# Patient Record
Sex: Male | Born: 1945 | Race: White | Hispanic: No | Smoking: Former smoker
Health system: Southern US, Community
[De-identification: ages and names within clinical notes are randomized; demographics above are authoritative.]

## PROBLEM LIST (undated history)

## (undated) DIAGNOSIS — E039 Hypothyroidism, unspecified: Secondary | ICD-10-CM

## (undated) DIAGNOSIS — R011 Cardiac murmur, unspecified: Secondary | ICD-10-CM

## (undated) DIAGNOSIS — J449 Chronic obstructive pulmonary disease, unspecified: Secondary | ICD-10-CM

## (undated) DIAGNOSIS — C3490 Malignant neoplasm of unspecified part of unspecified bronchus or lung: Secondary | ICD-10-CM

## (undated) DIAGNOSIS — E785 Hyperlipidemia, unspecified: Secondary | ICD-10-CM

## (undated) DIAGNOSIS — I2510 Atherosclerotic heart disease of native coronary artery without angina pectoris: Secondary | ICD-10-CM

## (undated) DIAGNOSIS — K219 Gastro-esophageal reflux disease without esophagitis: Secondary | ICD-10-CM

## (undated) DIAGNOSIS — E669 Obesity, unspecified: Secondary | ICD-10-CM

## (undated) DIAGNOSIS — J189 Pneumonia, unspecified organism: Secondary | ICD-10-CM

## (undated) HISTORY — DX: Obesity, unspecified: E66.9

## (undated) HISTORY — DX: Atherosclerotic heart disease of native coronary artery without angina pectoris: I25.10

## (undated) HISTORY — PX: LUNG LOBECTOMY: SHX167

## (undated) HISTORY — DX: Malignant neoplasm of unspecified part of unspecified bronchus or lung: C34.90

## (undated) HISTORY — DX: Hyperlipidemia, unspecified: E78.5

---
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
## 1988-10-08 HISTORY — PX: CORONARY ANGIOPLASTY: SHX604

## 1999-02-15 ENCOUNTER — Encounter: Admission: RE | Admit: 1999-02-15 | Discharge: 1999-02-15 | Payer: Self-pay | Admitting: Rheumatology

## 1999-02-15 ENCOUNTER — Encounter: Payer: Self-pay | Admitting: Rheumatology

## 1999-06-17 ENCOUNTER — Emergency Department (HOSPITAL_COMMUNITY): Admission: EM | Admit: 1999-06-17 | Discharge: 1999-06-17 | Payer: Self-pay | Admitting: *Deleted

## 1999-06-22 ENCOUNTER — Ambulatory Visit (HOSPITAL_COMMUNITY): Admission: RE | Admit: 1999-06-22 | Discharge: 1999-06-22 | Payer: Self-pay | Admitting: Cardiothoracic Surgery

## 1999-06-22 ENCOUNTER — Encounter: Payer: Self-pay | Admitting: Cardiothoracic Surgery

## 1999-06-29 ENCOUNTER — Encounter (INDEPENDENT_AMBULATORY_CARE_PROVIDER_SITE_OTHER): Payer: Self-pay | Admitting: Specialist

## 1999-06-29 ENCOUNTER — Ambulatory Visit (HOSPITAL_COMMUNITY): Admission: RE | Admit: 1999-06-29 | Discharge: 1999-06-29 | Payer: Self-pay | Admitting: Cardiothoracic Surgery

## 1999-06-29 ENCOUNTER — Encounter: Payer: Self-pay | Admitting: Cardiothoracic Surgery

## 1999-07-16 ENCOUNTER — Encounter: Payer: Self-pay | Admitting: Cardiothoracic Surgery

## 1999-07-20 ENCOUNTER — Encounter (INDEPENDENT_AMBULATORY_CARE_PROVIDER_SITE_OTHER): Payer: Self-pay | Admitting: *Deleted

## 1999-07-20 ENCOUNTER — Inpatient Hospital Stay (HOSPITAL_COMMUNITY): Admission: RE | Admit: 1999-07-20 | Discharge: 1999-07-27 | Payer: Self-pay | Admitting: Cardiothoracic Surgery

## 1999-07-20 ENCOUNTER — Encounter: Payer: Self-pay | Admitting: Cardiothoracic Surgery

## 1999-07-20 ENCOUNTER — Encounter (INDEPENDENT_AMBULATORY_CARE_PROVIDER_SITE_OTHER): Payer: Self-pay | Admitting: Specialist

## 1999-07-21 ENCOUNTER — Encounter: Payer: Self-pay | Admitting: Cardiothoracic Surgery

## 1999-07-22 ENCOUNTER — Encounter: Payer: Self-pay | Admitting: Cardiothoracic Surgery

## 1999-07-23 ENCOUNTER — Encounter: Payer: Self-pay | Admitting: Cardiothoracic Surgery

## 1999-07-24 ENCOUNTER — Encounter: Payer: Self-pay | Admitting: Cardiothoracic Surgery

## 1999-07-25 ENCOUNTER — Encounter: Payer: Self-pay | Admitting: Cardiothoracic Surgery

## 1999-08-06 ENCOUNTER — Encounter: Payer: Self-pay | Admitting: Cardiothoracic Surgery

## 1999-08-06 ENCOUNTER — Encounter: Admission: RE | Admit: 1999-08-06 | Discharge: 1999-08-06 | Payer: Self-pay | Admitting: Cardiothoracic Surgery

## 1999-08-20 ENCOUNTER — Encounter: Payer: Self-pay | Admitting: Cardiothoracic Surgery

## 1999-08-20 ENCOUNTER — Encounter: Admission: RE | Admit: 1999-08-20 | Discharge: 1999-08-20 | Payer: Self-pay | Admitting: Cardiothoracic Surgery

## 1999-10-01 ENCOUNTER — Encounter: Admission: RE | Admit: 1999-10-01 | Discharge: 1999-10-01 | Payer: Self-pay | Admitting: Cardiothoracic Surgery

## 1999-10-01 ENCOUNTER — Encounter: Payer: Self-pay | Admitting: Cardiothoracic Surgery

## 1999-11-26 ENCOUNTER — Encounter: Payer: Self-pay | Admitting: Cardiothoracic Surgery

## 1999-11-26 ENCOUNTER — Encounter: Admission: RE | Admit: 1999-11-26 | Discharge: 1999-11-26 | Payer: Self-pay | Admitting: Cardiothoracic Surgery

## 2000-01-21 ENCOUNTER — Encounter: Payer: Self-pay | Admitting: Cardiothoracic Surgery

## 2000-01-21 ENCOUNTER — Encounter: Admission: RE | Admit: 2000-01-21 | Discharge: 2000-01-21 | Payer: Self-pay | Admitting: Cardiothoracic Surgery

## 2000-02-02 ENCOUNTER — Encounter: Payer: Self-pay | Admitting: Cardiothoracic Surgery

## 2000-02-02 ENCOUNTER — Ambulatory Visit (HOSPITAL_COMMUNITY): Admission: RE | Admit: 2000-02-02 | Discharge: 2000-02-02 | Payer: Self-pay | Admitting: Cardiothoracic Surgery

## 2000-02-02 ENCOUNTER — Encounter (INDEPENDENT_AMBULATORY_CARE_PROVIDER_SITE_OTHER): Payer: Self-pay | Admitting: Specialist

## 2000-03-29 ENCOUNTER — Encounter: Admission: RE | Admit: 2000-03-29 | Discharge: 2000-06-27 | Payer: Self-pay | Admitting: Anesthesiology

## 2000-04-28 ENCOUNTER — Encounter: Admission: RE | Admit: 2000-04-28 | Discharge: 2000-04-28 | Payer: Self-pay | Admitting: Cardiothoracic Surgery

## 2000-04-28 ENCOUNTER — Encounter: Payer: Self-pay | Admitting: Cardiothoracic Surgery

## 2000-07-08 DIAGNOSIS — C3490 Malignant neoplasm of unspecified part of unspecified bronchus or lung: Secondary | ICD-10-CM

## 2000-07-08 HISTORY — DX: Malignant neoplasm of unspecified part of unspecified bronchus or lung: C34.90

## 2000-07-24 ENCOUNTER — Encounter: Admission: RE | Admit: 2000-07-24 | Discharge: 2000-10-07 | Payer: Self-pay | Admitting: Anesthesiology

## 2000-09-29 ENCOUNTER — Encounter: Admission: RE | Admit: 2000-09-29 | Discharge: 2000-09-29 | Payer: Self-pay | Admitting: Cardiothoracic Surgery

## 2000-09-29 ENCOUNTER — Encounter: Payer: Self-pay | Admitting: Cardiothoracic Surgery

## 2001-04-20 ENCOUNTER — Encounter: Admission: RE | Admit: 2001-04-20 | Discharge: 2001-04-20 | Payer: Self-pay | Admitting: Cardiothoracic Surgery

## 2001-04-20 ENCOUNTER — Encounter: Payer: Self-pay | Admitting: Cardiothoracic Surgery

## 2001-09-21 ENCOUNTER — Encounter: Payer: Self-pay | Admitting: Cardiothoracic Surgery

## 2001-09-21 ENCOUNTER — Encounter: Admission: RE | Admit: 2001-09-21 | Discharge: 2001-09-21 | Payer: Self-pay | Admitting: Cardiothoracic Surgery

## 2002-05-24 ENCOUNTER — Encounter: Payer: Self-pay | Admitting: Cardiothoracic Surgery

## 2002-05-24 ENCOUNTER — Encounter: Admission: RE | Admit: 2002-05-24 | Discharge: 2002-05-24 | Payer: Self-pay | Admitting: Cardiothoracic Surgery

## 2003-06-06 ENCOUNTER — Encounter: Admission: RE | Admit: 2003-06-06 | Discharge: 2003-06-06 | Payer: Self-pay | Admitting: Cardiothoracic Surgery

## 2003-07-19 ENCOUNTER — Emergency Department (HOSPITAL_COMMUNITY): Admission: EM | Admit: 2003-07-19 | Discharge: 2003-07-19 | Payer: Self-pay | Admitting: Emergency Medicine

## 2004-05-25 ENCOUNTER — Encounter: Admission: RE | Admit: 2004-05-25 | Discharge: 2004-05-25 | Payer: Self-pay | Admitting: Cardiothoracic Surgery

## 2007-01-13 ENCOUNTER — Inpatient Hospital Stay (HOSPITAL_COMMUNITY): Admission: EM | Admit: 2007-01-13 | Discharge: 2007-01-18 | Payer: Self-pay | Admitting: Emergency Medicine

## 2007-01-13 ENCOUNTER — Ambulatory Visit: Payer: Self-pay | Admitting: Internal Medicine

## 2007-01-15 ENCOUNTER — Encounter (INDEPENDENT_AMBULATORY_CARE_PROVIDER_SITE_OTHER): Payer: Self-pay | Admitting: Internal Medicine

## 2007-02-06 ENCOUNTER — Ambulatory Visit: Payer: Self-pay | Admitting: Pulmonary Disease

## 2007-02-06 DIAGNOSIS — J449 Chronic obstructive pulmonary disease, unspecified: Secondary | ICD-10-CM | POA: Insufficient documentation

## 2007-02-06 DIAGNOSIS — E782 Mixed hyperlipidemia: Secondary | ICD-10-CM

## 2007-02-14 ENCOUNTER — Encounter: Payer: Self-pay | Admitting: Pulmonary Disease

## 2007-03-17 ENCOUNTER — Emergency Department (HOSPITAL_COMMUNITY): Admission: EM | Admit: 2007-03-17 | Discharge: 2007-03-17 | Payer: Self-pay | Admitting: Emergency Medicine

## 2007-04-03 ENCOUNTER — Ambulatory Visit: Payer: Self-pay | Admitting: Pulmonary Disease

## 2007-04-30 ENCOUNTER — Telehealth (INDEPENDENT_AMBULATORY_CARE_PROVIDER_SITE_OTHER): Payer: Self-pay | Admitting: *Deleted

## 2007-07-03 ENCOUNTER — Ambulatory Visit: Payer: Self-pay | Admitting: Pulmonary Disease

## 2007-09-28 ENCOUNTER — Telehealth: Payer: Self-pay | Admitting: Pulmonary Disease

## 2008-01-04 ENCOUNTER — Ambulatory Visit: Payer: Self-pay | Admitting: Pulmonary Disease

## 2008-01-04 DIAGNOSIS — J209 Acute bronchitis, unspecified: Secondary | ICD-10-CM

## 2008-01-08 ENCOUNTER — Telehealth (INDEPENDENT_AMBULATORY_CARE_PROVIDER_SITE_OTHER): Payer: Self-pay | Admitting: *Deleted

## 2008-01-23 ENCOUNTER — Telehealth (INDEPENDENT_AMBULATORY_CARE_PROVIDER_SITE_OTHER): Payer: Self-pay | Admitting: *Deleted

## 2009-05-06 ENCOUNTER — Ambulatory Visit: Payer: Self-pay | Admitting: Pulmonary Disease

## 2009-09-03 ENCOUNTER — Ambulatory Visit: Payer: Self-pay | Admitting: Pulmonary Disease

## 2009-10-30 ENCOUNTER — Telehealth (INDEPENDENT_AMBULATORY_CARE_PROVIDER_SITE_OTHER): Payer: Self-pay | Admitting: *Deleted

## 2010-01-01 ENCOUNTER — Ambulatory Visit: Payer: Self-pay | Admitting: Pulmonary Disease

## 2010-01-14 ENCOUNTER — Inpatient Hospital Stay (HOSPITAL_COMMUNITY): Admission: EM | Admit: 2010-01-14 | Discharge: 2009-04-24 | Payer: Self-pay | Admitting: Emergency Medicine

## 2010-01-18 ENCOUNTER — Ambulatory Visit: Payer: Self-pay | Admitting: Pulmonary Disease

## 2010-01-18 DIAGNOSIS — J441 Chronic obstructive pulmonary disease with (acute) exacerbation: Secondary | ICD-10-CM | POA: Insufficient documentation

## 2010-02-05 ENCOUNTER — Ambulatory Visit
Admission: RE | Admit: 2010-02-05 | Discharge: 2010-02-05 | Payer: Self-pay | Source: Home / Self Care | Attending: Pulmonary Disease | Admitting: Pulmonary Disease

## 2010-02-19 ENCOUNTER — Ambulatory Visit
Admission: RE | Admit: 2010-02-19 | Discharge: 2010-02-19 | Payer: Self-pay | Source: Home / Self Care | Attending: Pulmonary Disease | Admitting: Pulmonary Disease

## 2010-02-28 ENCOUNTER — Encounter: Payer: Self-pay | Admitting: Cardiothoracic Surgery

## 2010-03-09 NOTE — Progress Notes (Signed)
Summary: advair to [facility]  Phone Note From Pharmacy Call back at [phone]   Caller: [facility]* Call For: clance  Summary of Call: Need prescription for advair diskus 250-37mcg, states they never filled it before for pt.  Initial call taken by: Darletta Moll,  October 30, 2009 11:54 AM  Follow-up for Phone Call        advair 250-19mcg is on med list.  last ov in July, upcoming in November.  refills sent to pt's pharmacy of choice.   Follow-up by: Boone Master CNA/MA,  October 30, 2009 12:28 PM    Prescriptions: ADVAIR DISKUS 250-50 MCG/DOSE  MISC (FLUTICASONE-SALMETEROL) 1 puff two times a day  #1 x 6   Entered by:   Boone Master CNA/MA   Authorized by:   Barbaraann Share MD   Signed by:   Boone Master CNA/MA on 10/30/2009   Method used:   Electronically to        [facility]* (retail)       [address] [place], [place]  [address]       Ph: [phone] or [phone]       Fax: [phone]   RxID:   [id number]

## 2010-03-09 NOTE — Assessment & Plan Note (Signed)
Summary: rov for emphysema   Primary Provider/Referring Provider:  Windle Guard  CC:  HFU for COPD and pneumonia. Patient says he has some sob with exertion and back pain. He denies any cough..  History of Present Illness: The pt comes in today for f/u of his known emphysema, complicated by ongoing smoking.  He was recentlly in the hospital for a COPD exacerbation, and was asked to f/u here in 2 weeks.  He is slowly gettting back to his usual chronic doe baseline.  He has minimal cough with no purulence.  Unfortunately, he continues to smoke.  He is back on his usual bronchodilator regimen.  Current Medications (verified): 1)  Levothyroxine Sodium 137 Mcg Tabs (Levothyroxine Sodium) .Marland Kitchen.. 1 By Mouth Daily 2)  Spiriva Handihaler 18 Mcg  Caps (Tiotropium Bromide Monohydrate) .... Inhale 1 Capsule Once A Day 3)  Aspirin 81 Mg Tbec (Aspirin) .... 2 By Mouth Daily 4)  Advair Diskus 250-50 Mcg/dose  Misc (Fluticasone-Salmeterol) .Marland Kitchen.. 1 Puff Two Times A Day 5)  Oxygen .... 2l At Bedtime 6)  Allegra 180 Mg Tabs (Fexofenadine Hcl) .Marland Kitchen.. 1 By Mouth Daily As Needed 7)  Advil 200 Mg Tabs (Ibuprofen) .... 2 By Mouth Every 8 Hours  Allergies (verified): 1)  ! Sulfa  Review of Systems      See HPI  Vital Signs:  Patient profile:   65 year old male Height:      67 inches (170.18 cm) Weight:      205 pounds (93.18 kg) BMI:     32.22 O2 Sat:      92 % on Room air Temp:     98.4 degrees F (36.89 degrees C) oral Pulse rate:   106 / minute BP sitting:   124 / 82  (left arm) Cuff size:   regular  Vitals Entered By: Michel Bickers CMA (May 06, 2009 11:17 AM)  O2 Sat at Rest %:  92 O2 Flow:  Room air  Physical Exam  General:  obese male in nad Lungs:  decreased bs throughout, with minimal basilar crackles. Heart:  rrr, no mrg Extremities:  no edema or cyanosis Neurologic:  alert and oriented,moves all 4.   Impression & Recommendations:  Problem # 1:  EMPHYSEMA (ICD-492.8)  the pt is s/p  recent acute exacerbation requiring hospitalization.  He is nearly back to his usual baseline.  Unfortunately, he continues to smoke, and I have told him this will be a recurring theme until he quits.  He is on an excellent bronchodilator regimen, and is using his oxygen primarily at HS.  I have also asked him to work on weight loss and some type of exercise program.  I have also given the pt a brochure to the smoking cessation class at the cancer center.   Medications Added to Medication List This Visit: 1)  Levothyroxine Sodium 137 Mcg Tabs (Levothyroxine sodium) .Marland Kitchen.. 1 by mouth daily 2)  Aspirin 81 Mg Tbec (Aspirin) .... 2 by mouth daily 3)  Allegra 180 Mg Tabs (Fexofenadine hcl) .Marland Kitchen.. 1 by mouth daily as needed 4)  Advil 200 Mg Tabs (Ibuprofen) .... 2 by mouth every 8 hours  Other Orders: Est. Patient Level III (02725) Tobacco use cessation intermediate 3-10 minutes (36644)  Patient Instructions: 1)  stay on oxygen at bedtime and when you are exerting yourself heavily. 2)  stop smoking.. you will continue to get sick if you do not quit. 3)  no change in inhalers. 4)  followup with me in  4mos.

## 2010-03-09 NOTE — Assessment & Plan Note (Signed)
Summary: rov for emphysema   Visit Type:  Follow-up Primary Tasheika Kitzmiller/Referring Abdimalik Mayorquin:  Windle Guard  CC:  4 month f/u. pt states his breathing has unchanged. pt denies any cough. pt c/o dry cough. pt states he currently smokes 1 ppd. .  History of Present Illness: the pt comes in today for f/u of his known emphysema.  He is maintaining on his usual bronchodilator regimen, but unfortunately continues to smoke.  He has a mild dry cough, but no chest congestion or purulent mucus.  He denies a recent pulmonary infection or acute exacerbation.  His doe is at his usual baseline.  Current Medications (verified): 1)  Levothyroxine Sodium 125 Mcg Tabs (Levothyroxine Sodium) .... Once Daily 2)  Spiriva Handihaler 18 Mcg  Caps (Tiotropium Bromide Monohydrate) .... Inhale 1 Capsule Once A Day 3)  Aspirin 81 Mg Tbec (Aspirin) .... 2 By Mouth Daily 4)  Advair Diskus 250-50 Mcg/dose  Misc (Fluticasone-Salmeterol) .Marland Kitchen.. 1 Puff Two Times A Day 5)  Oxygen .Marland Kitchen.. 1l At Bedtime 6)  Allegra 180 Mg Tabs (Fexofenadine Hcl) .Marland Kitchen.. 1 By Mouth Daily As Needed 7)  Advil 200 Mg Tabs (Ibuprofen) .... 2 By Mouth Every 8 Hours 8)  Proair Hfa 108 (90 Base) Mcg/act  Aers (Albuterol Sulfate) .... 2 Puffs Every 4-6 Hours As Needed 9)  Metformin Hcl 500 Mg Tabs (Metformin Hcl) .Marland Kitchen.. 1 Two Times A Day  Allergies (verified): 1)  ! Sulfa  Review of Systems       The patient complains of shortness of breath with activity and nasal congestion/difficulty breathing through nose.  The patient denies shortness of breath at rest, productive cough, non-productive cough, coughing up blood, chest pain, irregular heartbeats, acid heartburn, indigestion, loss of appetite, weight change, abdominal pain, difficulty swallowing, sore throat, tooth/dental problems, headaches, sneezing, itching, ear ache, anxiety, depression, hand/feet swelling, joint stiffness or pain, rash, change in color of mucus, and fever.    Vital Signs:  Patient  profile:   65 year old male Height:      67 inches Weight:      199.38 pounds BMI:     31.34 O2 Sat:      90 % on Room air Temp:     976 degrees F oral Pulse rate:   82 / minute BP sitting:   122 / 80  (left arm) Cuff size:   regular  Vitals Entered By: Carver Fila (January 01, 2010 8:53 AM)  O2 Flow:  Room air CC: 4 month f/u. pt states his breathing has unchanged. pt denies any cough. pt c/o dry cough. pt states he currently smokes 1 ppd.  Comments meds and allergies updated Phone number updated Carver Fila  January 01, 2010 8:53 AM    Physical Exam  General:  obese male in nad Lungs:  decrease bs, no wheezing or rhonchi Heart:  rrr, no mrg Extremities:  no edema or cyanosis  Neurologic:  alert and oriented, moves all 4.   Impression & Recommendations:  Problem # 1:  EMPHYSEMA (ICD-492.8) the pt is maintaining his usual baseline on his current bronchodilator regimen.  However, I have reminded him that smoking cessation would result in improvement, and more importantly prevent worsening.  He is to stay on his current meds, and also work on some type of conditioning and weight loss program.    Medications Added to Medication List This Visit: 1)  Levothyroxine Sodium 125 Mcg Tabs (Levothyroxine sodium) .... Once daily 2)  Oxygen  .Marland Kitchen.. 1l at bedtime  3)  Metformin Hcl 500 Mg Tabs (Metformin hcl) .Marland Kitchen.. 1 two times a day  Other Orders: Est. Patient Level III (27253)  Patient Instructions: 1)  no change in breathing meds. 2)  work on quitting smoking and weight reduction 3)  followup with me in 6mos.   Immunization History:  Influenza Immunization History:    Influenza:  historical (11/07/2009)

## 2010-03-09 NOTE — Assessment & Plan Note (Signed)
Summary: rov for emphysema   Visit Type:  Follow-up Primary Provider/Referring Provider:  Windle Guard  CC:  Emphysema follow-up. The patient c/o increased SOB due to the heat and humidity. He does not wear his oxygen at bedtime every night. Occasional chest congestion with yellow mucus. Still smoking 1 pack per day.Marland Kitchen  History of Present Illness: the pt comes in today for f/u of his known emphysema and ongoing tobacco use.  Unfortunately, he continues to smoke, and has noted a little more doe with the heat and humidity of the summer.  He has minimal cough or congestion, and minimal discolored mucus.  He is supposed to wear oxygen all night while sleeping, but is only wearing as needed.  He has not had any recent acute exacerbations.  Preventive Screening-Counseling & Management  Alcohol-Tobacco     Smoking Status: current     Smoking Cessation Counseling: yes     Packs/Day: 1.0     Tobacco Counseling: to quit use of tobacco products  Current Medications (verified): 1)  Levothyroxine Sodium 137 Mcg Tabs (Levothyroxine Sodium) .Marland Kitchen.. 1 By Mouth Daily 2)  Spiriva Handihaler 18 Mcg  Caps (Tiotropium Bromide Monohydrate) .... Inhale 1 Capsule Once A Day 3)  Aspirin 81 Mg Tbec (Aspirin) .... 2 By Mouth Daily 4)  Advair Diskus 250-50 Mcg/dose  Misc (Fluticasone-Salmeterol) .Marland Kitchen.. 1 Puff Two Times A Day 5)  Oxygen .... 2l At Bedtime 6)  Allegra 180 Mg Tabs (Fexofenadine Hcl) .Marland Kitchen.. 1 By Mouth Daily As Needed 7)  Advil 200 Mg Tabs (Ibuprofen) .... 2 By Mouth Every 8 Hours  Allergies (verified): 1)  ! Sulfa  Social History: Packs/Day:  1.0  Review of Systems       The patient complains of shortness of breath with activity, productive cough, and non-productive cough.  The patient denies shortness of breath at rest, coughing up blood, chest pain, irregular heartbeats, acid heartburn, indigestion, loss of appetite, weight change, abdominal pain, difficulty swallowing, sore throat, tooth/dental  problems, headaches, nasal congestion/difficulty breathing through nose, sneezing, itching, ear ache, anxiety, depression, hand/feet swelling, joint stiffness or pain, rash, change in color of mucus, and fever.    Vital Signs:  Patient profile:   65 year old male Height:      67 inches (170.18 cm) Weight:      198.38 pounds (90.17 kg) BMI:     31.18 O2 Sat:      90 % on Room air Temp:     97.5 degrees F (36.39 degrees C) oral Pulse rate:   86 / minute BP sitting:   138 / 82  (left arm) Cuff size:   regular  Vitals Entered By: Michel Bickers CMA (September 03, 2009 9:03 AM)  O2 Sat at Rest %:  90 O2 Flow:  Room air CC: Emphysema follow-up. The patient c/o increased SOB due to the heat and humidity. He does not wear his oxygen at bedtime every night. Occasional chest congestion with yellow mucus. Still smoking 1 pack per day. Comments Medications reviewed with the patient. Daytime phone number verified. Michel Bickers CMA  September 03, 2009 9:04 AM   Physical Exam  General:  obese male in nad Lungs:  decreased bs throughout, faint bibasilar crackles, one isolated squeak in right base. Heart:  rrr, no mrg Extremities:  no edema noted, no cyanosis Neurologic:  alert and oriented, moves all 4.   Impression & Recommendations:  Problem # 1:  EMPHYSEMA (ICD-492.8) the pt has known emphysema and is on an  aggressive bronchodilator regimen.  Unfortunately, he continues to smoke.  I have asked him to stay on his current inhaler regimen, and that he must stop smoking to have any chance of getting better.  I have also stressed the need to wear oxygen all night while sleeping, in order to prevent prolonged episodes of desaturation.  Medications Added to Medication List This Visit: 1)  Proair Hfa 108 (90 Base) Mcg/act Aers (Albuterol sulfate) .... 2 puffs every 4-6 hours as needed  Other Orders: Est. Patient Level III (16109) Tobacco use cessation intermediate 3-10 minutes (60454)  Patient  Instructions: 1)  stay on current meds. 2)  wear oxgyen everynight, all night.  Also wear if heavily exerting yourself. 3)  stop smoking. 4)  followup with me in 4mos.   Prescriptions: PROAIR HFA 108 (90 BASE) MCG/ACT  AERS (ALBUTEROL SULFATE) 2 puffs every 4-6 hours as needed  #1 x 6   Entered and Authorized by:   Barbaraann Share MD   Signed by:   Barbaraann Share MD on 09/03/2009   Method used:   Print then Give to Patient   RxID:   [id number]

## 2010-03-11 NOTE — Assessment & Plan Note (Signed)
Summary: Acute NP office visit - bronchitis   Primary Provider/Referring Provider:  Windle Guard  CC:  prod cough with thick yellow mucus, wheezing, and DOE - states is worse since 12.12.11 ov w/ KC x1week.  History of Present Illness:  02/05/10-Presents for an acute office visit. Complains of prod cough with thick yellow mucus, wheezing, DOE. Seen last week w/ COPD flare. Tx w/ omnicef. Improved minimally. Has very harsh cough that is getting worse. We discussed importance of smoking cesstation. Denies chest pain, , orthopnea, hemoptysis, fever, n/v/d, edema, headache.  Medications Prior to Update: 1)  Levothyroxine Sodium 125 Mcg Tabs (Levothyroxine Sodium) .... Once Daily 2)  Spiriva Handihaler 18 Mcg  Caps (Tiotropium Bromide Monohydrate) .... Inhale 1 Capsule Once A Day 3)  Aspirin 81 Mg Tbec (Aspirin) .... 2 By Mouth Daily 4)  Advair Diskus 250-50 Mcg/dose  Misc (Fluticasone-Salmeterol) .Marland Kitchen.. 1 Puff Two Times A Day 5)  Oxygen .Marland Kitchen.. 1l At Bedtime 6)  Allegra 180 Mg Tabs (Fexofenadine Hcl) .Marland Kitchen.. 1 By Mouth Daily As Needed 7)  Advil 200 Mg Tabs (Ibuprofen) .... 2 By Mouth Every 8 Hours 8)  Metformin Hcl 500 Mg Tabs (Metformin Hcl) .Marland Kitchen.. 1 Two Times A Day 9)  Xopenex Hfa 45 Mcg/act Aero (Levalbuterol Tartrate) .... 2 Puffs As Needed 10)  Prednisone 10 Mg  Tabs (Prednisone) .... Take 4 Each Day For 2 Days, Then 3 Each Day For 2 Days, Then 2 Each Day For 2 Days, Then 1 Each Day For 2 Days, Then Stop 11)  Cefdinir 300 Mg Caps (Cefdinir) .... 2 By Mouth Each Day For 5 Days 12)  Proair Hfa 108 (90 Base) Mcg/act  Aers (Albuterol Sulfate) .... 2 Puffs Every 4-6 Hours Only If  Needed For Rescue  Current Medications (verified): 1)  Levothyroxine Sodium 125 Mcg Tabs (Levothyroxine Sodium) .... Once Daily 2)  Spiriva Handihaler 18 Mcg  Caps (Tiotropium Bromide Monohydrate) .... Inhale 1 Capsule Once A Day 3)  Aspirin 81 Mg Tbec (Aspirin) .... 2 By Mouth Daily 4)  Advair Diskus 250-50 Mcg/dose  Misc  (Fluticasone-Salmeterol) .Marland Kitchen.. 1 Puff Two Times A Day 5)  Oxygen .Marland Kitchen.. 1l At Bedtime 6)  Allegra 180 Mg Tabs (Fexofenadine Hcl) .Marland Kitchen.. 1 By Mouth Daily As Needed 7)  Advil 200 Mg Tabs (Ibuprofen) .... 2 By Mouth Every 8 Hours 8)  Metformin Hcl 500 Mg Tabs (Metformin Hcl) .Marland Kitchen.. 1 Two Times A Day 9)  Xopenex Hfa 45 Mcg/act Aero (Levalbuterol Tartrate) .... 2 Puffs As Needed 10)  Proair Hfa 108 (90 Base) Mcg/act  Aers (Albuterol Sulfate) .... 2 Puffs Every 4-6 Hours Only If  Needed For Rescue  Allergies (verified): 1)  ! Sulfa  Past History:  Past Medical History: Last updated: 04/03/2007 EMPHYSEMA (ICD-492.8) LUNG CANCER (ICD-V16.1) status post right lower lobectomy for adenocarcinoma HYPERLIPIDEMIA (ICD-272.4)  Past Surgical History: Last updated: 02/14/2007 Lung Cancer-history of right lower lobectomy, with details unavailable history of coronary artery disease with angioplasty in the past  Family History: Last updated: 02/06/2007 Emphysema:  Lung Cancer  Social History: Last updated: 02/06/2007 two ppd for 45 years. Patient is a current smoker.   Review of Systems      See HPI  Vital Signs:  Patient profile:   65 year old male Height:      67 inches Weight:      199.38 pounds BMI:     31.34 O2 Sat:      91 % on Room air Temp:     96.2  degrees F oral Pulse rate:   85 / minute BP sitting:   106 / 68  (left arm) Cuff size:   large  Vitals Entered By: Boone Master CNA/MA (February 05, 2010 9:43 AM)  O2 Flow:  Room air CC: prod cough with thick yellow mucus, wheezing, DOE - states is worse since 12.12.11 ov w/ KC x1week Is Patient Diabetic? Yes Comments Medications reviewed with patient Daytime contact number verified with patient. Boone Master CNA/MA  February 05, 2010 9:44 AM    Physical Exam  Additional Exam:  GEN: A/Ox3; pleasant , NAD HEENT:  [place]/AT, , EACs-clear, TMs-wnl, NOSE-clear, THROAT-clear NECK:  Supple w/ fair ROM; no JVD; normal carotid impulses  w/o bruits; no thyromegaly or nodules palpated; no lymphadenopathy. RESP  Coarse BS w/ few rhonchi  CARD:  RRR, no m/r/g   GI:   Soft & nt; nml bowel sounds; no organomegaly or masses detected. [place]: Warm bil,  no calf tenderness edema, clubbing, pulses intact     Impression & Recommendations:  Problem # 1:  CHRONIC OBSTRUCTIVE PULMONARY DISEASE, ACUTE EXACERBATION (ICD-491.21) Slow to resolve flare  xray pending.  xopenex neb in office  Plan:  Avelox 400mg  once daily w/ food for 7 days.  Mucinex DM two times a day as needed cough/congestion  Rest/fluids.  follow up 2 weeks with Dr. Shelle Iron    Orders: Nebulizer Tx (16109) T-2 View CXR (71020TC) Est. Patient Level IV (60454)  Medications Added to Medication List This Visit: 1)  Avelox 400 Mg Tabs (Moxifloxacin hcl) .Marland Kitchen.. 1 by mouth once daily  Patient Instructions: 1)  Avelox 400mg  once daily w/ food for 7 days.  2)  Mucinex DM two times a day as needed cough/congestion  3)  Rest/fluids.  4)  2012 IS YOUR YEAR- WE ARE VERY SURE YOU CAN QUIT SMOKING. BEST LUCK TO YOU. YOU WILL BE RICHER AND HEALTHIER FOR QUITTING 5)  Use gum, sugarless candy, straws to help with the craving,  6)  May use nicotine patches as well as needed  7)  follow up 2 weeks with Dr. Shelle Iron  8)  I will call with xray results.  Prescriptions: AVELOX 400 MG TABS (MOXIFLOXACIN HCL) 1 by mouth once daily  #7 x 0   Entered and Authorized by:   Rubye Oaks NP   Signed by:   Tammy Parrett NP on 02/05/2010   Method used:   Electronically to        [facility]* (retail)       [address] [place], [place]  [address]       Ph: [phone] or [phone]       Fax: [phone]   RxID:   [id number]

## 2010-03-11 NOTE — Assessment & Plan Note (Signed)
Summary: rov for emphysema with recurrent exacerbations.   Visit Type:  Follow-up Primary Provider/Referring Provider:  Windle Guard  CC:  2 week follow up pt states his breathing has been "okay". Pt c/o cough w/ light yellow phlem, chest congestion, post nasal drip in the mornings, and raspy voice. Pt states although his symptoms are getting a little better. Pt states he currently smokes 1 ppd. .  History of Present Illness: the pt comes in today for f/u of his known emphysema.  He has recurrent copd exacerbations due to acute bronchitis related to his ongoing smoking, and was last treated for this the end of Dec by our NP.  He comes in today where he is doing better, and his mucus is clearing up.  He is getting back to his usual baseline.  Unfortunately, he continues to smoke one ppd.  Preventive Screening-Counseling & Management  Alcohol-Tobacco     Smoking Status: current     Smoking Cessation Counseling: yes     Packs/Day: 1.0     Tobacco Counseling: to quit use of tobacco products  Current Medications (verified): 1)  Levothyroxine Sodium 125 Mcg Tabs (Levothyroxine Sodium) .... Once Daily 2)  Spiriva Handihaler 18 Mcg  Caps (Tiotropium Bromide Monohydrate) .... Inhale 1 Capsule Once A Day 3)  Aspirin 81 Mg Tbec (Aspirin) .... 2 By Mouth Daily 4)  Advair Diskus 250-50 Mcg/dose  Misc (Fluticasone-Salmeterol) .Marland Kitchen.. 1 Puff Two Times A Day 5)  Oxygen .Marland Kitchen.. 1l At Bedtime 6)  Allegra 180 Mg Tabs (Fexofenadine Hcl) .Marland Kitchen.. 1 By Mouth Daily As Needed 7)  Advil 200 Mg Tabs (Ibuprofen) .... 2 By Mouth Every 8 Hours 8)  Metformin Hcl 500 Mg Tabs (Metformin Hcl) .Marland Kitchen.. 1 Two Times A Day 9)  Proair Hfa 108 (90 Base) Mcg/act  Aers (Albuterol Sulfate) .... 2 Puffs Every 4-6 Hours Only If  Needed For Rescue 10)  Mucinex 600 Mg Xr12h-Tab (Guaifenesin) .... As Needed  Allergies (verified): 1)  ! Sulfa  Social History: Packs/Day:  1.0  Review of Systems       The patient complains of shortness of  breath with activity, productive cough, sore throat, nasal congestion/difficulty breathing through nose, and joint stiffness or pain.  The patient denies non-productive cough, coughing up blood, chest pain, irregular heartbeats, indigestion, loss of appetite, weight change, abdominal pain, difficulty swallowing, tooth/dental problems, headaches, sneezing, itching, ear ache, anxiety, depression, hand/feet swelling, rash, change in color of mucus, and fever.    Vital Signs:  Patient profile:   65 year old male Height:      67 inches Weight:      201.13 pounds BMI:     31.62 O2 Sat:      92 % on Room air Temp:     97.7 degrees F oral Pulse rate:   84 / minute BP sitting:   140 / 80  (left arm) Cuff size:   large  Vitals Entered By: Carver Fila (February 19, 2010 10:22 AM)  O2 Flow:  Room air CC: 2 week follow up pt states his breathing has been "okay". Pt c/o cough w/ light yellow phlem, chest congestion, post nasal drip in the mornings, raspy voice. Pt states although his symptoms are getting a little better. Pt states he currently smokes 1 ppd.  Comments meds and allergies updated Phone number updated Carver Fila  February 19, 2010 10:22 AM     Physical Exam  General:  obese male in nad Lungs:  decreased bs, faint basilar  crackles no wheezing or rhonchi Heart:  rrr, no mrg Extremities:  no edema or cyanosis Neurologic:  alert and oriented, moves all 4.   Impression & Recommendations:  Problem # 1:  EMPHYSEMA (ICD-492.8)  the pt is getting over another episode of copd exacerbation, and currently is returning to baseline.  I have stressed to him the importance of smoking cessation, and explained this will be a recurring theme if he does not stop.  He is on a good bronchodilator regimen, and I have asked him to continue.  He will call if he does not continue to improve.  Medications Added to Medication List This Visit: 1)  Mucinex 600 Mg Xr12h-tab (Guaifenesin) .... As  needed  Other Orders: Est. Patient Level III (16109)  Patient Instructions: 1)  cancel old apptm, and come back and see me in 4mos 2)  no change in meds. 3)  stop smoking...you can do it.

## 2010-03-11 NOTE — Assessment & Plan Note (Signed)
Summary: acute sick visit for copd exacerbation   Primary Provider/Referring Provider:  Windle Guard  CC:  pt states he feels like he can't breathe. pt c/o cough w/ clear phlem, wheezing, and chest congestion and tightness. pt is currently smoking 1/2 pp couple days. Marland Kitchen  History of Present Illness: the pt comes in today for an acute sick visit.  He has known emphysema, and gives 2-3 day h/o increasing sob, chest congestion, and cough with discolored mucus.  Unfortunately, he has continued to smoke.  He denies any fever or chills, and has not had any chest pain.  Denies hemoptysis   Preventive Screening-Counseling & Management  Alcohol-Tobacco     Smoking Status: current     Smoking Cessation Counseling: yes     Packs/Day: 0.5     Tobacco Counseling: to quit use of tobacco products  Current Medications (verified): 1)  Levothyroxine Sodium 125 Mcg Tabs (Levothyroxine Sodium) .... Once Daily 2)  Spiriva Handihaler 18 Mcg  Caps (Tiotropium Bromide Monohydrate) .... Inhale 1 Capsule Once A Day 3)  Aspirin 81 Mg Tbec (Aspirin) .... 2 By Mouth Daily 4)  Advair Diskus 250-50 Mcg/dose  Misc (Fluticasone-Salmeterol) .Marland Kitchen.. 1 Puff Two Times A Day 5)  Oxygen .Marland Kitchen.. 1l At Bedtime 6)  Allegra 180 Mg Tabs (Fexofenadine Hcl) .Marland Kitchen.. 1 By Mouth Daily As Needed 7)  Advil 200 Mg Tabs (Ibuprofen) .... 2 By Mouth Every 8 Hours 8)  Metformin Hcl 500 Mg Tabs (Metformin Hcl) .Marland Kitchen.. 1 Two Times A Day 9)  Xopenex Hfa 45 Mcg/act Aero (Levalbuterol Tartrate) .... 2 Puffs As Needed  Allergies (verified): 1)  ! Sulfa  Past History:  Past medical, surgical, family and social histories (including risk factors) reviewed, and no changes noted (except as noted below).  Past Medical History: Reviewed history from 04/03/2007 and no changes required. EMPHYSEMA (ICD-492.8) LUNG CANCER (ICD-V16.1) status post right lower lobectomy for adenocarcinoma HYPERLIPIDEMIA (ICD-272.4)  Past Surgical History: Reviewed history from  02/14/2007 and no changes required. Lung Cancer-history of right lower lobectomy, with details unavailable history of coronary artery disease with angioplasty in the past  Family History: Reviewed history from 02/06/2007 and no changes required. Emphysema:  Lung Cancer  Social History: Reviewed history from 02/06/2007 and no changes required. two ppd for 45 years. Patient is a current smoker.  Packs/Day:  0.5  Review of Systems       The patient complains of shortness of breath with activity, productive cough, indigestion, sore throat, headaches, nasal congestion/difficulty breathing through nose, and joint stiffness or pain.  The patient denies shortness of breath at rest, non-productive cough, coughing up blood, chest pain, irregular heartbeats, acid heartburn, loss of appetite, weight change, abdominal pain, difficulty swallowing, tooth/dental problems, sneezing, itching, ear ache, anxiety, depression, hand/feet swelling, rash, change in color of mucus, and fever.    Vital Signs:  Patient profile:   65 year old male Height:      67 inches Weight:      199 pounds BMI:     31.28 O2 Sat:      92 % on Room air Temp:     97.8 degrees F oral Pulse rate:   90 / minute BP sitting:   130 / 76  (left arm) Cuff size:   regular  Vitals Entered By: Carver Fila (January 18, 2010 9:59 AM)  O2 Flow:  Room air CC: pt states he feels like he can't breathe. pt c/o cough w/ clear phlem, wheezing, chest congestion and tightness. pt  is currently smoking 1/2 pp couple days.  Comments meds and allergies updated Phone number updated Carver Fila  January 18, 2010 10:02 AM    Physical Exam  General:  obese male in nad Nose:  edematous and erythematous mucosa, no purulence Mouth:  clear, no exudates. Neck:  no jvd, tmg, LN Lungs:  a few rhochi, isolated wheeze in left base, adequate airflow Heart:  rrr, no mrg Extremities:  no edema or cyanosis  Neurologic:  alert and oriented, moves all  4.   Impression & Recommendations:  Problem # 1:  CHRONIC OBSTRUCTIVE PULMONARY DISEASE, ACUTE EXACERBATION (ICD-491.21) the pt is having an acute exacerbation of his known obstructive lung disease.  He will require a course of abx and prednisone to get thru this.  I have reminded the pt this will be a recurring theme unless he is able to stop smoking.  He is to stay on his current bronchodilator regimen as well, and to call if he is not improving.  Medications Added to Medication List This Visit: 1)  Xopenex Hfa 45 Mcg/act Aero (Levalbuterol tartrate) .... 2 puffs as needed 2)  Prednisone 10 Mg Tabs (Prednisone) .... Take 4 each day for 2 days, then 3 each day for 2 days, then 2 each day for 2 days, then 1 each day for 2 days, then stop 3)  Cefdinir 300 Mg Caps (Cefdinir) .... 2 by mouth each day for 5 days 4)  Proair Hfa 108 (90 Base) Mcg/act Aers (Albuterol sulfate) .... 2 puffs every 4-6 hours only if  needed for rescue  Other Orders: Est. Patient Level IV (54098) Tobacco use cessation intermediate 3-10 minutes (11914)  Patient Instructions: 1)  will treat with omnicef 300mg  2 each am for 5 days 2)  will treat with prednisone taper...follow directions on bottle. 3)  mucinex extra strength one in am and pm 4)  stop smoking 5)  keep usual followup with me  Prescriptions: PROAIR HFA 108 (90 BASE) MCG/ACT  AERS (ALBUTEROL SULFATE) 2 puffs every 4-6 hours only if  needed for rescue  #1 x 6   Entered and Authorized by:   Barbaraann Share MD   Signed by:   Barbaraann Share MD on 01/18/2010   Method used:   Print then Give to Patient   RxID:   [id number] CEFDINIR 300 MG CAPS (CEFDINIR) 2 by mouth each day for 5 days  #10 x 0   Entered and Authorized by:   Barbaraann Share MD   Signed by:   Barbaraann Share MD on 01/18/2010   Method used:   Print then Give to Patient   RxID:   [id number] PREDNISONE 10 MG  TABS (PREDNISONE) take 4 each day for 2 days, then 3 each day for 2 days,  then 2 each day for 2 days, then 1 each day for 2 days, then stop  #20 x 0   Entered and Authorized by:   Barbaraann Share MD   Signed by:   Barbaraann Share MD on 01/18/2010   Method used:   Print then Give to Patient   RxID:   [id number]

## 2010-04-30 LAB — DIFFERENTIAL
Basophils Absolute: 0 10*3/uL (ref 0.0–0.1)
Basophils Absolute: 0 10*3/uL (ref 0.0–0.1)
Basophils Relative: 0 % (ref 0–1)
Eosinophils Relative: 0 % (ref 0–5)
Eosinophils Relative: 1 % (ref 0–5)
Lymphocytes Relative: 13 % (ref 12–46)
Lymphocytes Relative: 9 % — ABNORMAL LOW (ref 12–46)
Lymphs Abs: 1 10*3/uL (ref 0.7–4.0)
Monocytes Absolute: 0.5 10*3/uL (ref 0.1–1.0)
Neutro Abs: 5.7 10*3/uL (ref 1.7–7.7)
Neutrophils Relative %: 76 % (ref 43–77)

## 2010-04-30 LAB — CBC
HCT: 39.7 % (ref 39.0–52.0)
HCT: 39.7 % (ref 39.0–52.0)
HCT: 42.5 % (ref 39.0–52.0)
HCT: 43.1 % (ref 39.0–52.0)
Hemoglobin: 14.7 g/dL (ref 13.0–17.0)
MCHC: 34.2 g/dL (ref 30.0–36.0)
Platelets: 133 10*3/uL — ABNORMAL LOW (ref 150–400)
Platelets: 188 10*3/uL (ref 150–400)
Platelets: 189 10*3/uL (ref 150–400)
Platelets: 189 10*3/uL (ref 150–400)
Platelets: 195 10*3/uL (ref 150–400)
RDW: 14.4 % (ref 11.5–15.5)
RDW: 14.6 % (ref 11.5–15.5)
WBC: 14.1 10*3/uL — ABNORMAL HIGH (ref 4.0–10.5)
WBC: 14.9 10*3/uL — ABNORMAL HIGH (ref 4.0–10.5)
WBC: 17.4 10*3/uL — ABNORMAL HIGH (ref 4.0–10.5)
WBC: 7.5 10*3/uL (ref 4.0–10.5)

## 2010-04-30 LAB — GLUCOSE, CAPILLARY
Glucose-Capillary: 155 mg/dL — ABNORMAL HIGH (ref 70–99)
Glucose-Capillary: 228 mg/dL — ABNORMAL HIGH (ref 70–99)
Glucose-Capillary: 242 mg/dL — ABNORMAL HIGH (ref 70–99)
Glucose-Capillary: 253 mg/dL — ABNORMAL HIGH (ref 70–99)
Glucose-Capillary: 257 mg/dL — ABNORMAL HIGH (ref 70–99)
Glucose-Capillary: 260 mg/dL — ABNORMAL HIGH (ref 70–99)
Glucose-Capillary: 263 mg/dL — ABNORMAL HIGH (ref 70–99)
Glucose-Capillary: 275 mg/dL — ABNORMAL HIGH (ref 70–99)
Glucose-Capillary: 313 mg/dL — ABNORMAL HIGH (ref 70–99)

## 2010-04-30 LAB — CULTURE, RESPIRATORY W GRAM STAIN
Culture: NORMAL
Gram Stain: NONE SEEN

## 2010-04-30 LAB — CARDIAC PANEL(CRET KIN+CKTOT+MB+TROPI)
CK, MB: 6.4 ng/mL (ref 0.3–4.0)
Relative Index: 3.2 — ABNORMAL HIGH (ref 0.0–2.5)
Relative Index: 3.5 — ABNORMAL HIGH (ref 0.0–2.5)
Total CK: 203 U/L (ref 7–232)
Troponin I: <0.01 ng/mL (ref 0.00–0.06)

## 2010-04-30 LAB — EXPECTORATED SPUTUM ASSESSMENT W GRAM STAIN, RFLX TO RESP C

## 2010-04-30 LAB — BLOOD GAS, ARTERIAL
Acid-Base Excess: 2.3 mmol/L — ABNORMAL HIGH (ref 0.0–2.0)
Bicarbonate: 27.3 mEq/L — ABNORMAL HIGH (ref 20.0–24.0)
O2 Saturation: 91.5 %
Patient temperature: 98.6
TCO2: 28.9 mmol/L (ref 0–100)

## 2010-04-30 LAB — BASIC METABOLIC PANEL
BUN: 14 mg/dL (ref 6–23)
BUN: 15 mg/dL (ref 6–23)
BUN: 15 mg/dL (ref 6–23)
BUN: 17 mg/dL (ref 6–23)
BUN: 8 mg/dL (ref 6–23)
CO2: 34 mEq/L — ABNORMAL HIGH (ref 19–32)
Calcium: 8.2 mg/dL — ABNORMAL LOW (ref 8.4–10.5)
Calcium: 8.5 mg/dL (ref 8.4–10.5)
Calcium: 8.6 mg/dL (ref 8.4–10.5)
Creatinine, Ser: 0.66 mg/dL (ref 0.4–1.5)
Creatinine, Ser: 0.69 mg/dL (ref 0.4–1.5)
GFR calc Af Amer: >60 mL/min (ref >60)
GFR calc non Af Amer: >60 mL/min (ref >60)
GFR calc non Af Amer: >60 mL/min (ref >60)
GFR calc non Af Amer: >60 mL/min (ref >60)
GFR calc non Af Amer: >60 mL/min (ref >60)
GFR calc non Af Amer: >60 mL/min (ref >60)
Glucose, Bld: 121 mg/dL — ABNORMAL HIGH (ref 70–99)
Glucose, Bld: 237 mg/dL — ABNORMAL HIGH (ref 70–99)
Potassium: 4.4 mEq/L (ref 3.5–5.1)
Potassium: 4.5 mEq/L (ref 3.5–5.1)
Potassium: 4.5 mEq/L (ref 3.5–5.1)
Potassium: 4.7 mEq/L (ref 3.5–5.1)
Sodium: 140 mEq/L (ref 135–145)

## 2010-04-30 LAB — POCT CARDIAC MARKERS
Myoglobin, poc: 139 ng/mL (ref 12–200)
Troponin i, poc: <0.05 ng/mL (ref 0.00–0.09)

## 2010-04-30 LAB — COMPREHENSIVE METABOLIC PANEL
BUN: 7 mg/dL (ref 6–23)
CO2: 28 mEq/L (ref 19–32)
Calcium: 8.5 mg/dL (ref 8.4–10.5)
Chloride: 99 mEq/L (ref 96–112)
Creatinine, Ser: 0.66 mg/dL (ref 0.4–1.5)
GFR calc non Af Amer: >60 mL/min (ref >60)
Total Bilirubin: 1 mg/dL (ref 0.3–1.2)

## 2010-04-30 LAB — LIPID PANEL
LDL Cholesterol: 113 mg/dL — ABNORMAL HIGH (ref 0–99)
Total CHOL/HDL Ratio: 4.2 RATIO
Triglycerides: 104 mg/dL (ref <150)
VLDL: 21 mg/dL (ref 0–40)

## 2010-04-30 LAB — CK TOTAL AND CKMB (NOT AT ARMC): Relative Index: 2.6 — ABNORMAL HIGH (ref 0.0–2.5)

## 2010-04-30 LAB — HEMOGLOBIN A1C: Mean Plasma Glucose: 154 mg/dL

## 2010-04-30 LAB — TSH: TSH: 0.218 u[IU]/mL — ABNORMAL LOW (ref 0.350–4.500)

## 2010-06-22 ENCOUNTER — Encounter: Payer: Self-pay | Admitting: Pulmonary Disease

## 2010-06-22 NOTE — Discharge Summary (Signed)
William Dyer, William Dyer                ACCOUNT NO.:  [id number]   MEDICAL RECORD NO.:  [id number]          PATIENT TYPE:  INP   LOCATION:  1428                         FACILITY[facility]   PHYSICIAN:  Hillery Aldo, M.D.   DATE OF BIRTH:  Feb 19, 1945   DATE OF ADMISSION:  01/13/2007  DATE OF DISCHARGE:  01/18/2007                               DISCHARGE SUMMARY   PRIMARY CARE PHYSICIAN:  Dr. Windle Guard.   CARDIOLOGIST:  Dr. Shawnie Pons.   PULMONOLOGIST:  Dr. Shelle Iron.   DISCHARGE DIAGNOSES:  1. Hypercarbic respiratory failure.  2. Chronic obstructive pulmonary disease with acute exacerbation.  3. Bilateral community-acquired pneumonia.  4. Ongoing tobacco abuse.  5. Dyslipidemia.  6. Hypothyroidism.  7. Steroid-induced hyperglycemia.  8. Hypertension.  9. History of coronary artery disease.  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  11.Gastroesophageal reflux disease.  12.Allergic rhinitis.   DISCHARGE MEDICATIONS:  1. Synthroid 125 mcg daily.  2. Aspirin 325 mg daily.  3. Advair 250/50 1 inhalation b.i.d.  4. Albuterol 2 puffs q.4 h. p.r.n.  5. Diltiazem 30 mg q.12 h.  6. Mucinex 1200 mg q.12 h.  7. Claritin 10 mg daily.  8. Nasonex 2 sprays in each nostril daily.  9. Avelox 400 mg daily x5 more days.  10.Zocor 20 mg q.p.m.  11.Prednisone taper as directed.  12.Spiriva 1 inhalation daily.   CONSULTATIONS:  Dr. Sherene Sires of pulmonary critical care medicine.   BRIEF ADMISSION HISTORY OF PRESENT ILLNESS:  The patient is a 65-year-  old male with past medical history of partial pneumonectomy on the  right, for treatment of adenocarcinoma and severe COPD with ongoing  tobacco, use who presented to the hospital with worsening dyspnea.  He  also noted a productive cough with yellow sputum and high fevers.  He  presented to the hospital for further evaluation and workup and was  admitted for treatment.  For the full  details, please see the dictated  report done by Dr. Waymon Amato.   PROCEDURES AND DIAGNOSTIC STUDIES:  1. Chest X-Ray: On January 13, 2007 showed probable bilateral      pneumonia, superimposed on-chronic lung disease.  2. Repeat chest x-ray on January 15, 2007 showed improved aeration to      the left lower lobe.  3. A 2-D echocardiogram on January 15, 2007 showed normal left      ventricular systolic function, with an ejection fraction estimated      at 60%.  Study was inadequate for the evaluation of left      ventricular regional wall motion.  Study was limited, due to poor      acoustic windows.  4. Chest x-ray on January 16, 2007 showed slightly increased bilateral      interstitial and air space opacities with cardiomegaly.   DISCHARGE LABORATORY VALUES:  Sodium was 140, potassium 3.8, chloride  95, bicarb 40, BUN 12, creatinine 0.74, glucose 126.  White blood cell  count was 11.5, hemoglobin 13.7, hematocrit 40.2, platelets 248.   HOSPITAL COURSE:  By  problem:  1. Hypercarbic respiratory failure secondary to COPD exacerbation and      bilateral pneumonia:  The patient was admitted and empirically put      on Solu-Medrol, nebulized bronchodilator therapy, and      Rocephin/azithromycin antibiotic therapy.  A brain atraumatic      peptide hormone level was checked and found to be within normal      limits.  His respiratory status had actually deteriorated on      January 15, 2007, prompting an evaluation of his arterial blood      gases which showed hypercarbia.  The patient was transferred to the      step-down unit, and a pulmonary critical care consult was called,      and the patient was kindly seen by Dr. Sherene Sires.  He had increased work      of breathing, and there was a concern that he would end up needing      to be intubated.  The patient was put on BiPAP with slow      improvement.  His mucolytic medication was increased to help with      secretions.  The Solu-Medrol was  titrated up.  By January 16, 2007,      the patient began to dramatically improve, and he was transitioned      over to p.o. antibiotics.  His Solu-Medrol was changed to p.o.      prednisone.  He was transferred back to the floor and continued to      improve clinically.  His white blood cell count continued to      decline, and he was afebrile.  Because of his severe underlying      lung disease, including a history of pneumonectomy, the patient      will be followed by Dr. Shelle Iron in the outpatient setting.  2. History of coronary artery disease:  The patient did not complain      of chest pain during the course of his hospitalization.  He had no      elevation of his BNP to suggest heart failure.  A 2-D      echocardiogram was performed and showed normal LV function.  3. Ongoing tobacco abuse:  The patient was counseled extensively on      the importance of cessation, given his underlying lung disease. At      first, the patient was reluctant to even consider smoking      cessation, but by the end of his hospitalization, he admitted that      he would like to quit but was unsure if he could promise that he      could do so.  The patient was given encouragement and was provided      with the number to enroll in a smoking cessation class after      discharge.  4. Dyslipidemia:  The patient did have evidence of dyslipidemia.  His      total cholesterol was 242, triglycerides 106, HDL 39, LDL 182.      Given his history of coronary disease, the patient was started on      Statin therapy.  He will need a follow-up check of his liver      function studies and a repeat fasting lipid panel in approximately      6 weeks, with titration of his Statin dose upward, if indicated.  5. Hypothyroidism:  The patient's TSH was checked and found to be  3.083, indicating adequate control of his hypothyroidism on his      current dose of Synthroid.  6. Steroid-induced hyperglycemia:  The patient did  experience some      elevated blood glucoses related to the initiation of steroid      therapy.  He was put on sliding scale insulin, to address these      episodes.  At this time, his steroids are being tapered and it is      expected that his glycemic control will return to normal.  7. Hypertension:  The patient did have elevated blood pressures      throughout the course of his hospitalization.  He was put on low-      dose Cardizem and will be discharged on this.  Low dose Cardizem is      currently controlling his blood pressures well.   DISPOSITION:  The patient is medically stable for discharge.  He will be  ambulated in the hallway prior to discharge, and if he qualifies for  home oxygen therapy, this will be set up.  He is instructed to follow up  with primary care physician in 1 to 2 weeks and to follow up with Dr.  Shelle Iron at his scheduled appointment time on December 30 at the 10:15  a.m.      Hillery Aldo, M.D.  Electronically Signed     CR/MEDQ  D:  01/18/2007  T:  01/18/2007  Job:  [id number]   cc:   Windle Guard, M.D.  Fax: [phone]   Arturo Morton. Riley Kill, MD, [facility]  [address]  [address]  [place]  [place] [address]   Barbaraann Share, MD,FCCP  [address]  [place]  [place] [address]

## 2010-06-22 NOTE — H&P (Signed)
NAMEVANDELL, KUN                ACCOUNT NO.:  [id number]   MEDICAL RECORD NO.:  [id number]          PATIENT TYPE:  EMS   LOCATION:  ED                           FACILITY:  [facility]   PHYSICIAN:  Marcellus Scott, MD     DATE OF BIRTH:  10-10-1945   DATE OF ADMISSION:  01/13/2007  DATE OF DISCHARGE:                              HISTORY & PHYSICAL   STAT ADMISSION HISTORY AND PHYSICAL   PRIMARY CARE PHYSICIAN:  Windle Guard, MD.   CARDIOLOGIST:  Arturo Morton. Riley Kill, MD.   CHIEF COMPLAINT:  1. Productive cough.  2. Dyspnea.  3. High fevers.   HISTORY OF PRESENTING ILLNESS:  [name] is a pleasant, 65 year old,  Caucasian male patient with history of right partial pneumonectomy for  adenocarcinoma of the right lower lobe, coronary artery disease, COPD,  hypothyroidism, ongoing heavy tobacco abuse, who was in his usual state  of health until five days ago.  At baseline, patient has dyspnea on  exertion where walking to keep the garbage can out in the street and  returning to the house brings on dyspnea.  The patient, however, is not  oxygen dependent.  Since five days, patient has noted a productive cough  with yellow sputum, high fevers with associated chills and rigors,  worsening dyspnea with wheezing, and chest tightness.  These symptoms  were not resolved with his home medications following which the patient  presented to the emergency room.  He has received a nebulizer treatment  in the ER and continues to wheeze.  The patient had called EMS because  of his symptoms, and en route here the patient was noted to have oxygen  saturations of 94% and he received two doses of nebulizations and a dose  of Solu-Medrol 125 mg IV on route.  The patient indicates that he feels  slightly better but still has significant respiratory distress.   PAST MEDICAL HISTORY:  1. Hypothyroidism.  2. Coronary artery disease status post angioplasty x3 approximately 14      years ago.  3. Right lower  lobe adenocarcinoma of the lung status post a partial      pneumonectomy.  4. Gastroesophageal reflux disease.  5. Hypercholesterolemia.  6. COPD.   PAST SURGICAL HISTORY:  1. Right partial pneumonectomy.  2. Hernia repair.  3. Adenoidectomy.  4. Tonsillectomy.   ALLERGIES:  SULFA CAUSES MUSCLE SPASMS.   MEDICATIONS:  1. Synthroid 125 mcg p.o. daily.  2. Aspirin 325 mg p.o. daily.  3. Advair 250/50 one dose inhaled p.r.n.  4. Albuterol two puffs inhaled p.r.n.   FAMILY HISTORY:  1. Mother died at age 3 of kidney disease.  2. The patient's father died at age 34 of COPD complications.  3. The patient's sister died at age 27 of brain cancer.   SOCIAL HISTORY:  The patient is married.  The patient's spouse and a  daughter are at the bedside.  The patient is a retired Environmental education officer.  He has ongoing smoking of two packs per day for the last 45  to 50 years.  The patient claims he  does not want to quit smoking  because he has been smoking for a long time.  No history of alcohol or  drug abuse.   ADVANCED DIRECTIVES:  Full code.   REVIEW OF SYSTEMS:  14 systems reviewed and apart of history of  presenting illness is noncontributory.  The patient has received a flu  shot this year and a pneumonia shot approximately 10 years ago.  The  patient denies any swelling of the legs, a sore throat, headache,  earache.   PHYSICAL EXAMINATION:  GENERAL:  Mr. [name] is a moderately built and  obese male patient in mild to moderate respiratory distress.  VITAL SIGNS:  Temperature is 98.3 degrees Fahrenheit, pulse 110 per  minute and regular, respirations 24 per minute, blood pressure 158/87,  saturating at 91 to 92% on four liters of oxygen per minute by nasal  cannula.  HEAD, EYES, ENT:  Nontraumatic, normocephalic.  Pupils equally reactive  to light and accommodation.  Oral cavity with no oropharyngeal erythema  or thrush.  NECK:  Without JVD, carotid bruits, lymphadenopathy, or  goiter.  Supple.  LYMPHATICS:  No lymphadenopathy.  RESPIRATORY SYSTEM:  No accessory muscles active.  He has a thoracotomy  scar on the right lower back.  There are decreased breath sounds  bilaterally with bilateral expiratory medium pitched rhonchi.  No  wheezing.  There are crackles in both bases, the right greater than the  left.  CARDIOVASCULAR SYSTEM:  First and second heart sounds heard.  No third  or fourth heart sounds or murmurs.  ABDOMEN:  Obese.  Nontender.  No organomegaly or mass appreciated.  Bowel sounds are normally heard.  CENTRAL NERVOUS SYSTEM:  The patient is awake, alert, and oriented x3.  No focal neurological deficits.  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  No cyanosis,  clubbing, or edema.  Peripheral pulses are symmetrically felt.  SKIN:  Without any rashes.  MUSCULOSKELETAL SYSTEM:  Unremarkable.   LABORATORY DATA:  Basic metabolic panel is remarkable only for a glucose  of 171.  BUN 9, creatinine 0.65.  CBC is with a hemoglobin of 14.7,  hematocrit of 42.5, white blood cells 9.5, platelets 206.  Chest x-ray  has been reviewed by me and has been preliminarily reported as bilateral  pneumonia on chronic lung disease.   ASSESSMENT AND PLAN:  1. Bilateral community-acquired lower lobe pneumonia.  Will admit      patient.  Will obtain sputum culture and treat empirically with      intravenous Ceftriaxone and Zithromax.  Will provide Mucinex to      loosen secretions and Robitussin-DM for cough.  2. Chronic obstructive pulmonary disease infective exacerbation.  Will      admit patient to telemetry.  Will provide oxygen, bronchodilator      nebulizers, intravenous steroids, and intravenous antibiotics.      Will monitor continuous pulse oximetry.  Will also continue on      scheduled Advair.  3. Tobacco abuse.  Will provide tobacco cessation counseling and a       nicotine patch.  4. Hyperglycemia.  Will check hemoglobin A1c and place on sliding      scale insulin.  5. Hypothyroidism.  Will check TSH and continue Synthroid.  6. Coronary artery disease status post angioplasty.  Will continue      aspirin at this time.  The patient indicates that he was  noncompliant with his Lipitor and stopped following up with his      cardiologist.  7. Dyslipidemia : for a fasting lipid check and consider restarting      his statins.  8. Gastroesophageal reflux disease: proton pump inhibitors.  9. Noncompliance: for counseling.      Marcellus Scott, MD  Electronically Signed     AH/MEDQ  D:  01/13/2007  T:  01/13/2007  Job:  [id number]   cc:   Windle Guard, M.D.  Fax: [phone]   Arturo Morton. Riley Kill, MD, [facility]  [address]  [address]  [place]  [place] [address]

## 2010-06-23 ENCOUNTER — Encounter: Payer: Self-pay | Admitting: Pulmonary Disease

## 2010-06-23 ENCOUNTER — Ambulatory Visit (INDEPENDENT_AMBULATORY_CARE_PROVIDER_SITE_OTHER): Payer: Medicare Other | Admitting: Pulmonary Disease

## 2010-06-23 VITALS — BP 124/70 | HR 78 | Temp 97.6°F | Ht 67.0 in | Wt 194.4 lb

## 2010-06-23 DIAGNOSIS — J438 Other emphysema: Secondary | ICD-10-CM

## 2010-06-23 NOTE — Progress Notes (Signed)
  Subjective:    Patient ID: William Dyer, male    DOB: [birth year], 65 y.o.   MRN: [id number]  HPI The pt comes in today for f/u of his known emphysema.  He is staying on his BD regimen, but is continuing to smoke.  He has not had an acute exac or pulm infection since his last visit.  His exertional tolerance is at baseline.  He is still wearing oxygen at HS   Review of Systems  Constitutional: Negative for fever and unexpected weight change.  HENT: Positive for ear pain, congestion, sore throat, rhinorrhea, sneezing, postnasal drip and sinus pressure. Negative for nosebleeds, trouble swallowing and dental problem.   Eyes: Positive for redness and itching.  Respiratory: Positive for chest tightness and shortness of breath. Negative for cough and wheezing.   Cardiovascular: Negative for palpitations and leg swelling.  Gastrointestinal: Positive for nausea. Negative for vomiting.  Genitourinary: Negative for dysuria.  Musculoskeletal: Negative for joint swelling.  Skin: Negative for rash.  Neurological: Negative for headaches.  Hematological: Does not bruise/bleed easily.  Psychiatric/Behavioral: Negative for dysphoric mood. The patient is not nervous/anxious.        Objective:   Physical Exam Obese male in nad Chest with deceased bs, a few rhonchi, no wheezing Cor with rrr No LE edema, no cyanosis Alert and oriented, moves all 4        Assessment & Plan:

## 2010-06-23 NOTE — Patient Instructions (Signed)
No change in meds Work on quitting smoking Follow up with me in 4mos

## 2010-06-23 NOTE — Assessment & Plan Note (Signed)
The pt is at his usual baseline currently.  He has had no recent acute exacerbations, and is not overusing rescue inhaler.  He understands his breathing will never get better until he quits smoking.  I have also encouraged him to work on weight loss and some type of conditioning program.

## 2010-06-25 NOTE — Discharge Summary (Signed)
[place]. [facility]  Patient:    William Dyer, William Dyer                         MRN: [id number] Adm. Date:  07/20/99 Attending:  Mikey Bussing, M.D. Dictator:   Marlowe Kays, P.A. CC:         Mikey Bussing, M.D.             Dr. [name]                           Discharge Summary  DATE OF BIRTH:  [birth year]  CHIEF COMPLAINT:  Right lung mass.  HISTORY OF PRESENT ILLNESS:  Mr. Zalewski is a pleasant 65 year old white married male referred by Dr. [name] for evaluation of right lower lobe mass with a left ninth rib fracture.  The patient presented to Dr. [name] office complaining of severe left anterior chest wall pain.  Previously the patient had presented to an urgent care facility with the same complaint, and a chest x-ray was obtained which was nondiagnostic.  However, the patient presented to Dr. [name] office, this time complaining of flank pain.  They ordered a CT of he abdomen and pelvis which was negative, but a 2.2 x 2.5 cm right lower lobe mass was found.  At that point, the patient was given pain medication and referred to Dr. Donata Clay for evaluation in suspicion that this mass was cancerous.  A CT of the chest shows a 2.0 x 2.5 cm mass in the posterior segment of the right lower lobe suspicious for lung cancer, and the fracture of the left anterior lateral ninth rib was suspicious for lytic lesion. As well, some mediastinal fullness in the xiphoid-esophageal recess was possibly consistent with adenopathy.  Bone scan showed increased activity in the left anterior rib without other bone lesions.  Dr. Donata Clay ordered a radiology-guided needle biopsy of the rib fracture which returned negative for malignancy and positive for inflammatory cells and, it appears, related to his chronic bronchitis and his history of violent coughing.  Dr. Donata Clay evaluated this patient, but due to his history of significant smoking habituation, the patient was  placed on oral Augmentin, Wellbutrin, and Serevent inhaler in order to optimize pulmonary function prior to surgery which Dr. Donata Clay had recommended.  In fact, in his last visit, PFTs were obtained which showed an FVC of 2.11 and FEV1 of 1.62 (60% of predicted) which is already improved from the previous 50% in May 2001.  In todays visit, the patient continues to cough but "not as violent" and dry without sputum production.  No hemoptysis, fever, or chills.  No sore throat, recent angina, or arrhythmias.  No palpitations.  No symptoms of TIA, CVA, or amaurosis fugax.  The patient is scheduled for surgery on July 20, 1999.  PAST MEDICAL HISTORY: 1. CAD. 2. Remote history of prostatitis. 3. Hypothyroidism. 4. Hypercholesterolemia. 5. Recent history of tobacco abuse.  PAST SURGICAL HISTORY: 1. Status post angioplasty, Dr. Riley Kill in 1991. 2. Status post right inguinal hernia repair, remote.  MEDICATIONS: 1. Aspirin 325 mg p.o. q.d. 2. Niaspan, unknown dose, q.d. 3. Synthroid, unknown dose, q.d.  ALLERGIES:  SULFA.  REVIEW OF SYSTEMS:  See HPI and Past Medical History for significant positives.  The patient denies any history of diabetes, kidney disease, asthma, TIA, CVA, amaurosis fugax, syncope, presyncope, PE, DVT, dysuria, hematuria,  GERD symptoms, CHF, hypertension.  He denies any history of pneumothorax.  He did have one episode of hemoptysis in 1999 which he states is either related to bronchitis or possibly pneumonia (no records).  FAMILY HISTORY:  Noncontributory.  SOCIAL HISTORY:  The patient is married.  He has one child in good health. 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  He decreased his intake of tobacco from two packs a day in May 2001, when he was seen for the first time, to no cigarette intake at this time.   He denies any alcohol intake.  PHYSICAL EXAMINATION:  GENERAL:  Well-developed, well-nourished, 65 year old white male  who looks his state age, alert and oriented x 3, in no acute distress.  VITAL SIGNS:  Blood pressure 130/86, pulse 86, respirations 18, saturation 94 to 95% on room air.  HEENT:  Normocephalic, atraumatic.  PERRLA. EOMI.  Funduscopic exam within normal limits.  NECK:  Supple.  No JVD, bruits, thyromegaly, or lymphadenopathy.  CHEST:  Symmetrical inspirations.  There is trace rhonchi in the right lower lobe.  No wheezes or rales.  Respirations nonlabored.  LYMPHADENOPATHY:  No axillary or supraclavicular lymphadenopathy.  CARDIOVASCULAR:  Regular rate and rhythm without murmurs, rubs, or gallops.  ABDOMEN:  Soft, nontender.  Bowel sounds present x 4.  No hepatosplenomegaly. No masses palpable or abdominal bruits.  Difficult exam because his abdomen is moderately obese and distended.  GU/RECTAL:  Deferred.  EXTREMITIES:  No clubbing, cyanosis, or edema.  SKIN:  No ulcerations.  Normal hair pattern.  Warm temperature.  PERIPHERAL PULSES:  Carotid 2+ bilaterally without bruits.  Femoral 2+ bilaterally without bruits.  Popliteal, dorsalis pedis, and posterior tibialis 2+ bilaterally.  NEUROLOGIC:  Grossly normal.  Normal gait.  DTRs 2+ bilaterally.  Muscle strength 5/5.  ASSESSMENT/PLAN:  A 65 year old white male, referred by Dr. [name] secondary to a found right lower lobe mass per chest x-ray and CT, who will undergo a right VATS with resection of this mass on July 20, 1999, by Dr. Donata Clay. Dr. Donata Clay seen and evaluated this patient prior to admission and has explained the risks and benefits involving the procedure, and the patient has agreed to continue. DD:  07/16/99 TD:  07/16/99 Job: [id number]

## 2010-06-25 NOTE — H&P (Signed)
[facility]  Patient:    William Dyer, William Dyer                       MRN: [id number] Adm. Date:  [id number] Attending:  Thyra Breed CC:         Buren Kos, M.D.   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  Since his previous evaluation he has been very well controlled on his current Lidoderm patches.  His biggest complaint today is polyarthralgias involving the knees, ankles, feet, and hands with intermittent.  He has seen Dr. Kellie Simmering who advised him that he likely had fibromyalgia.  He does have significant sleep intrusions.  Of note, the patient apparently has hypothyroidism which has been very difficult to control, and I advised him that it is very difficult to make the diagnosis of fibromyalgia in the face of hypothyroidism, as one can get an arthropathy associated with hypothyroidism.  PHYSICAL EXAMINATION:  VITAL SIGNS:  Blood pressure 157/79, repeat 146/82, heart rate 96, respiratory rate 20, and O2 saturation 94%.  Pain level is 1/10 over his abdomen.  EXTREMITIES:  Examination of his joints revealed good range of motion with tenderness at the Achilles tendon insertion of the calcaneus and over the plantar fascia of the feet.  Radial pulses and dorsalis pedis pulses were 2+ and symmetric.  He was tender and tender points consistent with fibromyalgia.  CURRENT MEDICATIONS:  Lidoderm patch, Levoxyl, aspirin, and Lopid.  IMPRESSION: 1. Intercostal neuropathy, stable. 2. Polyarthralgias. 3. Other medical problems per Dr. [name].  DISPOSITION: 1. Continue with Lidoderm patch. 2. Remain off of amitriptyline. 3. Trial of Vioxx 25 mg 1 p.o. q.d.  The patient was advised of the side    effects and advised to stop it if he was having exacerbations of his    problems. 4. Followup with me in eight weeks. DD:   05/30/00 TD:  05/31/00 Job: [id number]

## 2010-06-25 NOTE — H&P (Signed)
[facility]  Patient:    William Dyer                       MRN: [id number] Adm. Date:  [id number] Attending:  Thyra Breed CC:         [name]. [name], M.D.  Mikey Bussing, M.D.   History and Physical  HISTORY OF PRESENT ILLNESS:  William Dyer comes in for followup evaluation of his post-thoracotomy intercostal neuropathy.  Since his last evaluation, he has done well on his current management with just the lidocaine patches.  He noted that getting back on the Synthroid to an adequate dose has significantly reduced a lot of his discomfort peripherally.  He is having minimal complaints today.  He does note that he gets shooting pains from the right lower axillary region out into the right upper quadrant at times.  He denies any lung problems.  CURRENT MEDICATIONS: 1. Lidocaine patches. 2. Aspirin. 3. Synthroid. 4. Lopid.  PHYSICAL EXAMINATION:  VITAL SIGNS:  Blood pressure 122/83, heart rate 94, respiratory rate 18.  O2 saturation 95%.  Pain level is 3/10.  CHEST:  He has some allodynia in the right upper chest wall area.  LUNGS:  Clear.  IMPRESSION: 1. Intercostal neuropathy--stable on lidocaine. 2. Probable arthralgias secondary to hypothyroidism, markedly improved on    replacement. 3. Other medical problems per Dr. [name].  DISPOSITION: 1. Continue with Lidoderm patch. 2. Follow up with me in four to six months. DD:  08/21/00 TD:  08/21/00 Job: [id number]

## 2010-06-25 NOTE — Procedures (Signed)
. [facility]  Patient:    William Dyer, William Dyer                       MRN: [id number] Proc. Date: 07/20/99 Adm. Date:  [id number] Attending:  Mikey Bussing                           Procedure Report  PROCEDURE:  Epidural catheter placement for postoperative epidural fentanyl pain control.  Prior to the surgical procedures, the risks and benefits of postoperative epidural fentanyl pain control were discussed with the patient.  These included, but were not limited to the risk of nerve damage, paralysis, back ache, headache, infection, allergic reaction, and more commonly, itching.  The patient elected to proceed with the technique in consultation with his primary surgeon, Mikey Bussing, M.D.  After the surgical dressing was applied, the patient was turned to the full left lateral decubitus position.  A Betadine prep x 3 was performed in the lumbar region.  Sterile technique was used.  A #17 gauge Tuohy needle was inserted in a midline approach at the L3-4 interspace using air loss resistance.  The epidural space was located on the first pass.  There was no blood or CSF noted.  A nonstyleted catheter was placed 8 cm into the epidural space and the needle was removed.  There was again negative aspiration for blood or CSF.  A mixture of preservative-free Xylocaine 1% 5 cc, 0.09 normal saline 10 cc, and 100 mcg of fentanyl was then slowly injected into the epidural catheter.  The catheter was secured adhesed to the patients back using 4-inch Hypafix dressing over a 2 x 2 sterile gauze at the insertion site.  The patient was then returned to the supine position, awoke, extubated, and taken to the recovery room in good condition.  There he was placed on a continuous infusion of 5 mcg/cc epidural fentanyl and 1/16% Marcaine at an initial rate of 16 cc/hr.  He will be evaluated over the next two to three days for adequacy of pain control.  There were  no complications during the procedure. DD:  07/20/99 TD:  07/22/99 Job: [id number]

## 2010-06-25 NOTE — Discharge Summary (Signed)
[place]. [facility]  Patient:    William Dyer, William Dyer                       MRN: [id number] Adm. Date:  [id number] Disc. Date: [id number] Attending:  Mikey Bussing Dictator:   Lissa Merlin, P.A. CC:         [facility] Office             [name]. [name], M.D.             Arturo Morton. Riley Kill, M.D. [facility]                           Discharge Summary  DATE OF BIRTH:  03-14-1945  SURGEON:  Mikey Bussing, M.D.  PRIMARY MEDICAL DOCTOR:  [name]. [name], M.D.  Cardiologist:  Arturo Morton. Riley Kill, M.D.  ADMITTING DIAGNOSES: 1. Right lower lobe lung mass suspicious for cancer. 2. Associated left ninth rib fracture. 3. Left anterior wall chest pain.  PAST MEDICAL HISTORY: 1. Two packs per day smoking. 2. History of chronic bronchitis with severe coughing probably responsible for    a rib fracture. 3. History of pneumonia. 4. Coronary artery disease with percutaneous transluminal coronary angioplasty    in 1991. 5. Gastroesophageal reflux disease. 6. Hypothyroidism. 7. Hypercholesterolemia.  DISCHARGE DIAGNOSES: 1. Adenocarcinoma of the lung. 2. Productive cough, elevated WBC treated with antibiotic. 3. Status post flexible bronchoscopy and right thoracotomy and resection of    right lower lobe pneumonia on July 20, 1999.  HISTORY OF PRESENT ILLNESS:  William Dyer is a 65 year old white male smoker who had a three-week course of severe left anterior wall chest pain for which he was evaluated and treated by Dr. [name].  There was no instigating event.  It was pleuritic and ______ in nature.  He was treated with narcotic oral medications.  Chest x-ray was performed at an urgent care center which was nondiagnostic.  He subsequently underwent a re-x-ray with a CT scan of the chest, abdomen and a bone scan, and rib detail films of the left ribs.  These studies showed a 2 cm x 2.5 cm mass in the posterior segment of the right lower lobe suspicious for lung cancer and  a fracture of the left anterior lateral ninth rib with a possible lytic lesion on spot films, increased activity in the left anterior rib by bone scan without other bone lesions. The abdominal CT scan showed no significant abnormalities or lymphadenopathy. A chest CT scan also showed some mediastinal fullness in the azygoesophageal recess consistent with possible adenopathy from the right lower lobe.  The patient was referred by Dr. [name] for evaluation and treatment of his abnormal chest x-ray, chest CT, and rib x-rays.  Dr. Donata Clay evaluated William Dyer on Jun 25, 1999.  He explained to the patient and his wife that he had a probable primary lung cancer in the right lower lobe with possible extension to the mediastinum and/or left contralateral ninth rib.  He explained that surgical therapy was the best treatment option, if there was evidence of metastatic disease to the rib, then surgical therapy would not be recommended.  A needle biopsy of the rib was therefore scheduled for the following week.  The plan at the time was that the biopsy was positive and he would be referred to oncology service for treatment of systemic disease.  If it was negative, then  he would proceed to surgery.  This was discussed with the patient and his wife.  They understood the risks, benefits, details, and alternatives of the procedure, and agreed to proceed.  The biopsy of his rib showed no malignant cells and he was therefore scheduled for surgery.  HOSPITAL COURSE:  William Dyer was brought into the hospital for the elective procedure.  He underwent flexible bronchoscopy and right thoracotomy wedge resection of the right lower lobe on July 20, 1999, without complications and was transferred to the PACU in stable condition.  Postoperative day #1, he was in sinus rhythm, chest tubes were draining serous fluid, lungs were clear, decreased in the bases.  There was no air leak.  He was afebrile.  Vital signs were  stable.  He was sitting up in the chair.  Chest x-ray showed decreased lung volumes.  Hematocrit was 36, WBC 16.4.  Abdomen was slightly distended with no flatus.  Plan was to diurese pulmonary toilet, pain control, and liquid diet.  On June 14, potassium was 3.3 and was therefore replenished.  He was afebrile.  Vital signs were stable.  Saturation was 93% on 4 L of O2. Dulcolax suppository was ordered which subsequently produced a bowel movement. Postoperative day #3, WBC was 16,000 and William Dyer was noted to have a productive cough.  He was changed to Levaquin antibiotic.  On June 17, William Dyer was doing well.  Potassium was 3.1 and was again replenished.  His WBC was decreasing at 8.2.  His O2 saturation was gradually improving.  On July 26, 1999, William Dyer is stable and doing very well.  He is sitting up in the chair without complaints.  He states he feels well.  He is afebrile, his vital signs are stable.  His O2 saturation has improved to 95% on room air.  He has been diuresing very well.  His physical exam shows nonlabored respirations with an occasional rhonchi with cough.  Cardiac:  Regular rate and rhythm, S1, S2.  Abdomen is soft, nontender, nondistended, with positive bowel sounds. His wound is healing very well.  He has no lower extremity edema.  He is noted to be making good progress and is stable, and is anticipated to be discharged tomorrow pending satisfactory morning rounds.  MEDICATIONS: 1. Levaquin 500 mg one p.o. q.d. x 5 more days. 2. Synthroid as before 100 mcg p.o. q.d. 3. Resume Niaspan as at home. 4. Enteric-coated aspirin 325 mg p.o. q.d. as before. 5. Tylox one to two p.o. q.4-6h p.r.n. for pain.  ALLERGIES:  SULFA.  SPECIAL INSTRUCTIONS:  William Dyer is instructed to avoid strenuous activity, no heavy lifting.  He is told to walk daily and to use his incentive spirometer daily.  He is told he can shower.  He is told to keep his wound clean and dry  and to  use soap and water only and to call the office if he notices any changes with his wound such as redness, discharge, or swelling.  He is told to resume a regular diet as before.  He is instructed to go to [facility] one hour before his appointment with Dr. Donata Clay to get a chest x-ray and to bring his chest x-ray with him when he sees Dr. Donata Clay.  FOLLOW-UP:  William Dyer will see Dr. Donata Clay at the [facility] offie on Friday, August 06, 1999, at 2:45 p.m.  He will go to [facility] one hour before that for a chest  x-ray. DD:  07/26/99 TD:  07/27/99 Job: [id number]

## 2010-06-25 NOTE — H&P (Signed)
[facility]  Patient:    William Dyer, William Dyer                       MRN: [id number] Adm. Date:  [id number] Attending:  Thyra Breed CC:         Mikey Bussing, M.D.  [name]. [name], M.D.   History and Physical  NEW PATIENT EVALUATION  HISTORY:  The patient is a very pleasant 65 year old who is sent to [place] by Dr. Kathlee Nations Trigt III for evaluation of his right-sided upper abdominal and chest discomfort.  Patient was in his usual state of health up until July 20, 1999, when he underwent a partial pneumonectomy on the right side; following this, he has developed a burning-type of discomfort over the right lower thoracic region and right upper abdomen.  He has been treated with Norco and methadone by Dr. [name]. Elkins with no improvement.  He does not recall being treated with Elavil or gabapentin.  He described the pain as a gnawing, boring-type pain made worse by sitting and straightening up; it is improved by leaning forward to the right.  He feels as though there is a bulge in the right upper quadrant muscles.  He notes some mild numbness and tingling which were much more intense on the right lower thoracic and upper abdominal regions.  He has recently been evaluated because of a mass on a CAT scan with a skinny needle biopsy which he reported as being negative two months ago, but he is going for a repeat of this.  He currently is on aspirin, Niaspan, levoxyl, which was recently adjusted, and Welchol.  ALLERGIES:  SULFONAMIDES.  FAMILY HISTORY:  Positive for coronary artery disease and diabetes.  PAST SURGICAL HISTORY:  Significant for the pneumonectomy and hernia repair.  ACTIVE MEDICAL PROBLEMS:  Hypothyroidism diagnosed two years ago, with recent adjustments in his medications; coronary artery disease, status post angioplasty x 3; and lung cancer, status post resection.  SOCIAL HISTORY:  The patient is a Surveyor, minerals.  He smokes one  pack of cigarettes per day.  He has tried multiple times to quit.  He does not drink alcohol.  REVIEW OF SYSTEMS:  GENERAL:  He has night sweats which have been more prominent over recent weeks.  HEAD:  Rare headaches.  EYES:  Corrective lenses.  NOSE, MOUTH AND THROAT:  Negative.  EARS:  Significant for earaches. PULMONARY AND CARDIOVASCULAR:  See active medical problems and HPI.  GI: Negative.  GU:  Significant for some decreased urine stream which he has noted over recent years and probable underlying prostatic hypertrophy. MUSCULOSKELETAL:  The patient describes polyarthralgias which have been very prominent over the past few years; he has been seen by a rheumatologist.  He recently was noted to have his thyroid functions out of order and I suspect that he has arthropathy related to uncontrolled hypothyroidism, which may take months to improve even after he has this treated.  NEUROLOGIC:  See HPI for pertinent positive.  No history of seizure or stroke.  HEMATOLOGIC:  Negative. CUTANEOUS:  Significant for what sounds like tinea versicolor.  ENDOCRINE: Hypothyroidism at least two years.  PSYCHIATRIC:  Negative. ALLERGY/IMMUNOLOGIC:  Positive for recent allergies.  PHYSICAL EXAMINATION  VITAL SIGNS:  Blood pressure is 139/73, heart rate is 102, respiratory rate is 18, O2 saturation is 92% and pain level is 9/10.  GENERAL:  This is a pleasant obese male in no acute distress.  HEENT:  Head was normocephalic, atraumatic.  Eyes:  Extraocular movements intact with conjunctivae and sclerae clear.  Nose:  Patent nares without discharge.  Oropharynx was free of lesions.  NECK:  Supple without lymphadenopathy.  Carotids were 2+ and symmetric without bruits.  LUNGS:  Clear on the left side.  He had a few rhonchi on the right side with decreased breath sounds.  HEART:  Regular rate and rhythm.  ABDOMEN:  Obese without appreciable masses.  He had a mild ventral hernia.  GENITALIA:  Not  performed.  RECTAL:  Not performed.  EXTREMITIES:  The patient demonstrated a positive Tinels sign at the right wrist with radial pulses 2+ and symmetric.  He had evidence of previous carpal tunnel surgery on the left wrist.  Range of motion of his upper extremity joints was intact.  Lower extremities demonstrated some mild bony enlargement of the first metatarsophalangeal joint with dorsalis pedis pulses 2+ and symmetric.  NEUROLOGIC:  The patient was oriented x 4.  Cranial nerves II-XII were grossly intact.  Deep tendon reflexes were symmetric in the upper and lower extremities with downgoing toes.  Motor was 5/5 with symmetric bulk and tone. Coordination was grossly intact.  Sensory demonstrated attenuated pinprick in the T5-down-to-T9 distribution on the right side anteriorly with some bulging of the abdominal musculature on that side.  BACK:  No tenderness to percussion over the vertebrae, with negative straight leg raise signs.  IMPRESSION 1. 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 2. Other medical problems per Dr. 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  DISPOSITION 1. I advised the patient that I would like to try him on a very conservative    approach, starting out with tricyclic antidepressants and progressing up to    Neurontin if he has not tried this.  We discussed the possibility of using    a TENS unit but he feels like he cannot afford this.  I reviewed the    potential side-effects of Elavil in detail and advised him that if he has    exacerbations of his urinary problems, that we probably would not be able    to use this medication.  Other side-effects were also discussed in detail.    I will start with 25 mg one p.o. q.p.m. x 14 days and if he is not improved     he can go up to two tablets.  He was  given a prescription for 60 with 2    refills. 2. Follow up with me in four weeks. 3. Continue on other medications. 4. I reviewed with him the other possible therapeutic interventions and the    fact that we would start in a gradual progression with regard to treatment    modalities. DD:  03/30/00 TD:  03/31/00 Job: [id number]

## 2010-06-25 NOTE — Consult Note (Signed)
NAME:  William Dyer, William Dyer NO.:  [id number]   MEDICAL RECORD NO.:  [id number]                   PATIENT TYPE:  EMS   LOCATION:  ED                                   FACILITY:  [facility]   PHYSICIAN:  Adolph Pollack, M.D.            DATE OF BIRTH:  December 19, 1945   DATE OF CONSULTATION:  07/19/2003  DATE OF DISCHARGE:                                   CONSULTATION   REQUESTING PHYSICIAN:  Windle Guard, M.D.   REASON FOR CONSULTATION:  Right lower quadrant pain.   HISTORY OF PRESENT ILLNESS:  Mr. Scipio is a 65 year old male who came home  from work two days ago with the abrupt onset of some aching right lower  quadrant radiating around to the flank.  The more active he was, the more  the pain was. He did not have any fevers or chills.  No nausea or vomiting.  No anorexia.  No change in bowel habits.  No change in urinary habits.  Because of progressive pain, he presented to Dr. [name]' office and was sent  to the emergency department to have one of [place] evaluate him for possible  appendicitis.  Of note, it was the day that he the onset of pain, he was  doing a lot of bending and odd positioning and lifting.  He is a Education administrator.   PAST MEDICAL HISTORY:  1. Right lung cancer.  2. Coronary artery disease.  3. Hyperlipidemia.  4. Bronchitis.  5. Hypothyroidism.  6. Benign prostatic hypertrophy.  7. Chronic right chest wall pain.   PAST SURGICAL HISTORY:  1. Right lung wedge resection.  2. Right inguinal hernia repair.   ALLERGIES:  SULFA.   MEDICATIONS:  1. Levothroid.  2. Aspirin.  3. Lopid.   SOCIAL HISTORY:  He continues to smoke.  No alcohol use.  He is married.  He  is a Education administrator.   FAMILY HISTORY:  Positive for hypertension and heart disease throughout his  family.   REVIEW OF SYSTEMS:  CARDIOVASCULAR:  He denies any chest pain.  He has not  had any heart problems, he says, for the past 13 years, since he was seen by  Dr. Riley Kill.  RESPIRATORY:   He has the bronchitis and thinks he might have  some emphysema.  GI:  No peptic ulcer disease, diverticulitis, hepatitis.  GU:  No kidney stones.  ENDOCRINE:  No diabetes.  HEMATOLOGIC:  No known  bleeding disorders or transfusions.  No blood clots.  NEUROLOGIC:  No  strokes or seizures.   PHYSICAL EXAMINATION:  VITAL SIGNS:  Temperature is 97.2, blood pressure  140/86, pulse 100.  GENERAL:  A moderately obese male in no acute distress.  Very pleasant and  cooperative.  He is walking along fairly freely.  HEENT:  Eyes:  Extraocular movements are intact.  No icterus.  NECK:  Supple without palpable masses or obvious thyroid enlargement.  LUNGS:  The breath sounds are equal and clear.  Respirations are unlabored.  CHEST:  There is a well-healed scar in the posterolateral chest on the right  side.  HEART:  Regular rate and rhythm.  No murmur heard.  ABDOMEN:  Soft and obese.  There is right lower quadrant, some suprapubic,  and some flank tenderness to both palpation and percussion but no obvious  masses.  Active bowel sounds are noted.  BACK:  There is mild right costovertebral angle tenderness.  GU:  A right groin scar is present.  He has small bilateral floor deficits,  consistent with hernias.  Both testicles are descended without masses.  No  penile lesions.  RECTAL:  Normal tone.  No external lesions.  The prostate is slightly  enlarged.  The stool is heme-test negative.  EXTREMITIES:  He has good muscle tone and full range of motion.  Normal  station and gait.   LABORATORY DATA:  White cell count 7800.  No left shift.  Hemoglobin 15.8.  CMET is normal except for a glucose of 103.  UA is negative.   CT scan was reviewed with Dr. Rosalie Gums.  There is no acute inflammatory  process noted.  Specifically, the appendix appears to be normal.   IMPRESSION:  Right lower quadrant abdominal pain:  No obvious inflammatory  infectious process going on after 48 hours of onset.  May indeed  be a  musculoskeletal phenomenon.  No obvious mesenteric lymph node enlargement or  diverticular disease of the right colon.  I did tell him that the CT scan  was very, very accurate in this case, but there may be a small chance of  missing occult appendicitis.   PLAN:  We discussed doing diagnostic laparoscopy versus observation at home,  and he has chosen the latter.  I discussed with him light activity,  ibuprofen for pain, and to call us if he has worsening pain along with  fever, nausea, vomiting, anorexia, or signs of infection.  I discussed this  with his wife as well.                                               Adolph Pollack, M.D.    Kari Baars  D:  07/19/2003  T:  07/19/2003  Job:  [id number]   cc:   Windle Guard, M.D.  [address]  [place], [place] [address]  Fax: [phone]

## 2010-06-25 NOTE — Op Note (Signed)
[facility]  Patient:    William Dyer, William Dyer                       MRN: [id number] Proc. Date: 07/20/99 Adm. Date:  [id number] Attending:  Mikey Bussing CC:         [name]. [name], M.D.             CVTS office                           Operative Report  PREOPERATIVE DIAGNOSIS:  Right lower lobe nodule, rule out carcinoma.  POSTOPERATIVE DIAGNOSIS:  Right lower lobe nodule, rule out carcinoma.  OPERATION PERFORMED: 1. Flexible bronchoscopy. 2. Right thoracotomy and pulmonary resection of right lower lobe nodule.  SURGEON:  Mikey Bussing, M.D.  ASSISTANT:  Maxwell Marion, RNFNA  ANESTHESIA:  General.  ANESTHESIOLOGIST:  Bedelia Person, M.D.  INDICATIONS FOR PROCEDURE:  The patient is a 65 year old three pack per day smoker, who presents with newly diagnosed right lower lobe 2.5 cm nodule. There are no other lung nodules and he was prepared for resection of the nodule.  He had no clear adenopathy on CT scan except for some fullness at the right azygoesophageal recess, inferiorly and posteriorly.  After his smoking was reduced to a 1/2 pack per day load and his chronic bronchitis was treated with outpatient bronchodilators and antibiotics, he was scheduled for surgery. Prior to the operation, I discussed the situation with the newly diagnosed lung nodule with the patients wife as well as the patient and discussed the indications and benefits of resection of the nodule.  I reviewed the alternatives to surgical therapy and discussed the major aspects of the operation including the use of bronchoscopy initially, the placement of the surgical incision and general anesthesia, and the use of postoperative chest tube drainage and the expected hospital recovery period.  I discussed the associated risks of MI, bleeding, infection, and death.  I also discussed the potential for long term air leak.  He understood these implications for surgery and  agreed to proceed with operation as planned under informed consent.  DESCRIPTION OF PROCEDURE:  First flexible bronchoscopy was performed after the patient was intubated and induced for anesthesia.  The airways appeared to be characterized by chronic bronchitis with some mild edema and mild erythema and inflammatory changes.  There are no heavy or thick secretions. There are no endobronchial lesions in either the right lung or left lung.  All lobar and segmental bronchial orifices were examined and found to have diffuse mild bronchitis. The bronchoscope was then removed.  The patient was then turned to his right side after double lumen endotracheal tube was placed and the right chest was prepped and draped as a sterile field. A small right thoracotomy incision was made and the chest was entered at the bed of the sixth rib which was divided posteriorly.  The lungs were deflated and the thorax was examined. There was a solitary nodule in the periphery of the right lower lobe right above the diaphragm.  The remainder of the right lung including the right lower lobe, right middle lobe and right upper lobe had no palpable masses.  The mediastinal lymph nodes were examined and the right azygo-esophageal recess was examined and found to have only the pulmonary veins and some associated mediastinal fat and no mediastinal adenopathy was identified.  The azygous area  by the right mainstem bronchus was also examined and there was no adenopathy there.  The patient had excessive mediastinal fat due to his obese body habitus.  After the thorax had been thoroughly examined, the inferior pulmonary ligament was taken down and the right lower lobe mobilized.  The mass was then removed using the [place] [employer] GIA stapling devices and sent for pathologic analysis.  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  The first margin of resection was very close, so we did an additional wedge resection into  the parenchyna if the right lower lobe and the additional specimen showed the margin free of tumor.  The surgical stapled margin was then oversewn with 3-0 chromic to reinforce it and it was tested under 35 to 40 cmH2O pressure and there was no air leak.  Two chest tubes were then placed and brought out through separate incisions.  The thoracotomy was closed with interrupted #1 Vicryl pericostals followed by #1 interrupted Vicryls for the muscle layer and a running 2-0 Vicryl for the subcutaneous.  The skin was closed with a subcuticular and sterile dressings were applied.  The patient was then extubated and returned to the recovery room. DD:  07/20/99 TD:  07/22/99 Job: [id number]

## 2010-06-25 NOTE — H&P (Signed)
[facility]  Patient:    William Dyer, William Dyer                       MRN: [id number] Adm. Date:  [id number] Attending:  Thyra Breed CC:         Mikey Bussing, M.D.  [name]. [name], M.D.   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  Since his last evaluation, he noted only mild improvement at best from the initiation of his Elavil.  Unfortunately, his heart rate has gone up and he does feel as though it is racing.  He does continue to work.  He rates his pain at 6/10 and he complains of pain which is sharp, shearing, intermittent lancinating pain over the right anterior upper abdominal region.  PHYSICAL EXAMINATION:  VITAL SIGNS:  Blood pressure 129/71, heart rate is 120, respiratory rate is 20, O2 saturation is 93% and pain level is 6/10.  NEUROLOGIC:  Sensory exam reveals attenuated pinprick over the T6-T7 area on the right side.  IMPRESSION: 1. 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 2. Other medical problems per Dr. [name]. [name].  DISPOSITION: 1. Stop Elavil. 2. We discussed a TENS unit today but he still does not feel like he would be    interested in trying this. 3. Trial of Lidoderm patches; he was given samples to last him four weeks.  CURRENT MEDICATIONS: 1. Levoxyl 112 mcg per day. 2. Amitriptyline 25 mg at night. 3. Aspirin. 4. Niaspan. DD:  04/27/00 TD:  04/28/00 Job: [id number]

## 2010-07-12 ENCOUNTER — Other Ambulatory Visit: Payer: Self-pay | Admitting: Pulmonary Disease

## 2010-07-13 ENCOUNTER — Telehealth: Payer: Self-pay | Admitting: Pulmonary Disease

## 2010-07-13 MED ORDER — TIOTROPIUM BROMIDE MONOHYDRATE 18 MCG IN CAPS: 18.0000 ug | ORAL_CAPSULE | Freq: Every day | RESPIRATORY_TRACT | Status: DC

## 2010-07-13 NOTE — Telephone Encounter (Signed)
Spiriva rx sent to [facility] -- pt aware.

## 2010-09-03 ENCOUNTER — Other Ambulatory Visit: Payer: Self-pay | Admitting: *Deleted

## 2010-09-03 MED ORDER — FLUTICASONE-SALMETEROL 250-50 MCG/DOSE IN AEPB: 1.0000 | INHALATION_SPRAY | Freq: Two times a day (BID) | RESPIRATORY_TRACT | Status: DC

## 2010-10-25 ENCOUNTER — Encounter: Payer: Self-pay | Admitting: Pulmonary Disease

## 2010-10-25 ENCOUNTER — Ambulatory Visit (INDEPENDENT_AMBULATORY_CARE_PROVIDER_SITE_OTHER): Payer: Medicare Other | Admitting: Pulmonary Disease

## 2010-10-25 VITALS — BP 160/82 | HR 79 | Temp 97.9°F | Ht 67.0 in | Wt 197.8 lb

## 2010-10-25 DIAGNOSIS — Z23 Encounter for immunization: Secondary | ICD-10-CM

## 2010-10-25 DIAGNOSIS — J438 Other emphysema: Secondary | ICD-10-CM

## 2010-10-25 NOTE — Assessment & Plan Note (Signed)
The patient appears to be stable from a pulmonary standpoint, and has not had any recent acute exacerbation.  He had some increased shortness of breath over the summer due to the heat and humidity, but now is near his usual baseline.  I have asked him to continue on his bronchodilator regimen, and to work hard on smoking cessation.  I will give him a flu shot today, and he is to follow up in 4 months.

## 2010-10-25 NOTE — Progress Notes (Signed)
  Subjective:    Patient ID: William Dyer, male    DOB: [birth year], 65 y.o.   MRN: [id number]  HPI The patient comes in today for follow up of his known emphysema.  He is maintaining on his bronchodilator regimen, and has not had an acute exacerbation or chest infection since last visit.  Unfortunately he continues to smoke excessively, but is interested in trying to quit.  He would be willing to try smoking cessation program starting in January of next year.  He denies any significant cough, congestion, or mucus production.   Review of Systems  Constitutional: Negative for fever and unexpected weight change.  HENT: Positive for rhinorrhea. Negative for ear pain, nosebleeds, congestion, sore throat, sneezing, trouble swallowing, dental problem, postnasal drip and sinus pressure.   Eyes: Positive for itching. Negative for redness.  Respiratory: Positive for shortness of breath. Negative for cough, chest tightness and wheezing.   Cardiovascular: Negative for palpitations and leg swelling.  Gastrointestinal: Positive for nausea. Negative for vomiting.  Genitourinary: Negative for dysuria.  Musculoskeletal: Negative for joint swelling.  Skin: Negative for rash.  Neurological: Negative for headaches.  Hematological: Does not bruise/bleed easily.  Psychiatric/Behavioral: Negative for dysphoric mood. The patient is not nervous/anxious.        Objective:   Physical Exam Overweight male in no acute distress Nose without purulence or discharge noted Chest with decreased breath sounds throughout, a few inspiratory pops and rhonchi, no wheezes Cardiac exam was regular rate and rhythm Lower extremities without edema, no cyanosis Alert and oriented, moves all 4 extremities.        Assessment & Plan:

## 2010-10-25 NOTE — Patient Instructions (Signed)
Stop smoking  No change in meds. Will give you the flu shot today followup with me in 4mos

## 2010-10-25 NOTE — Progress Notes (Signed)
Addended by: Salli Quarry on: 10/25/2010 09:43 AM   Modules accepted: Orders

## 2010-10-29 LAB — URINALYSIS, ROUTINE W REFLEX MICROSCOPIC
Bilirubin Urine: NEGATIVE
Glucose, UA: NEGATIVE
Hgb urine dipstick: NEGATIVE
Ketones, ur: NEGATIVE
Protein, ur: NEGATIVE
Urobilinogen, UA: 0.2

## 2010-11-15 LAB — BASIC METABOLIC PANEL
BUN: 12
BUN: 13
BUN: 15
CO2: 29
CO2: 40 — ABNORMAL HIGH
Calcium: 8.6
Calcium: 8.7
Calcium: 9
Chloride: 91 — ABNORMAL LOW
Chloride: 94 — ABNORMAL LOW
Chloride: 95 — ABNORMAL LOW
Chloride: 97
Creatinine, Ser: 0.65
Creatinine, Ser: 0.66
Creatinine, Ser: 0.74
GFR calc non Af Amer: >60
GFR calc non Af Amer: >60
Glucose, Bld: 126 — ABNORMAL HIGH
Glucose, Bld: 152 — ABNORMAL HIGH
Glucose, Bld: 171 — ABNORMAL HIGH
Glucose, Bld: 198 — ABNORMAL HIGH
Glucose, Bld: 202 — ABNORMAL HIGH
Potassium: 3.8
Potassium: 4
Potassium: 4.2
Sodium: 138
Sodium: 141

## 2010-11-15 LAB — BLOOD GAS, ARTERIAL
Acid-Base Excess: 10.4 — ABNORMAL HIGH
Acid-Base Excess: 12 — ABNORMAL HIGH
Bicarbonate: 38.3 — ABNORMAL HIGH
Bicarbonate: 39.9 — ABNORMAL HIGH
Drawn by: 295031
FIO2: 0.5
Mode: POSITIVE
O2 Content: 3
O2 Saturation: 88.8
O2 Saturation: 98.1
Patient temperature: 98.6
Patient temperature: 98.6
Patient temperature: 98.6
TCO2: 31.5
TCO2: 33.7
TCO2: 34
TCO2: 35.4
pCO2 arterial: 68.7
pH, Arterial: 7.273 — ABNORMAL LOW
pH, Arterial: 7.283 — ABNORMAL LOW
pO2, Arterial: 56.9 — ABNORMAL LOW

## 2010-11-15 LAB — DIFFERENTIAL
Basophils Absolute: 0
Basophils Relative: 0
Eosinophils Absolute: 0.1 — ABNORMAL LOW
Monocytes Absolute: 0.3
Monocytes Relative: 3
Neutro Abs: 8.3 — ABNORMAL HIGH
Neutrophils Relative %: 87 — ABNORMAL HIGH

## 2010-11-15 LAB — CULTURE, BLOOD (ROUTINE X 2)
Culture: NO GROWTH
Culture: NO GROWTH

## 2010-11-15 LAB — CBC
HCT: 39.9
HCT: 40
HCT: 40.2
Hemoglobin: 13.6
Hemoglobin: 13.7
MCHC: 34.2
MCHC: 34.4
MCHC: 34.6
MCV: 92.4
MCV: 92.6
MCV: 93.3
MCV: 93.4
Platelets: 243
Platelets: 258
RBC: 4.31
RBC: 4.6
RDW: 14.4
RDW: 14.5
RDW: 14.5
RDW: 14.7
WBC: 11.5 — ABNORMAL HIGH
WBC: 18 — ABNORMAL HIGH

## 2010-11-15 LAB — HEMOGLOBIN A1C: Mean Plasma Glucose: 140

## 2010-11-15 LAB — LIPID PANEL
Total CHOL/HDL Ratio: 6.2
VLDL: 21

## 2010-11-15 LAB — EXPECTORATED SPUTUM ASSESSMENT W GRAM STAIN, RFLX TO RESP C

## 2010-11-15 LAB — B-NATRIURETIC PEPTIDE (CONVERTED LAB): Pro B Natriuretic peptide (BNP): <30

## 2010-12-21 ENCOUNTER — Other Ambulatory Visit: Payer: Self-pay | Admitting: Pulmonary Disease

## 2011-02-22 ENCOUNTER — Encounter: Payer: Self-pay | Admitting: Pulmonary Disease

## 2011-02-22 ENCOUNTER — Ambulatory Visit (INDEPENDENT_AMBULATORY_CARE_PROVIDER_SITE_OTHER): Payer: Medicare Other | Admitting: Pulmonary Disease

## 2011-02-22 VITALS — BP 140/78 | HR 91 | Temp 97.9°F | Ht 67.0 in | Wt 200.4 lb

## 2011-02-22 DIAGNOSIS — J438 Other emphysema: Secondary | ICD-10-CM

## 2011-02-22 MED ORDER — PREDNISONE 10 MG PO TABS: ORAL_TABLET | ORAL | Status: DC

## 2011-02-22 NOTE — Patient Instructions (Signed)
Stop smoking. Will treat with a course of prednisone for a week, and would like you to take mucinex during this time as well.  Let us know if not improving Stop spiriva for 2-3 weeks to see if blurry vision and urinary issues improve.  If they do not, then go back on spiriva, and you will need to see your eye doctor and also your primary to refer you to a urologist. If your back/lung pain continues, let us know.  Would start with a cxr, but consider ct chest if continues.  followup with me in 4mos if doing well.

## 2011-02-22 NOTE — Progress Notes (Signed)
  Subjective:    Patient ID: William Dyer, male    DOB: 09/16/1945, 66 y.o.   MRN: [id number]  HPI Patient comes in today for followup of his known emphysema.  He apparently had the flu in December, was treated with Tamiflu.  Shortly after that he developed chest congestion with purulent mucus, and was treated with a course of Avelox by his primary care physician.  He felt like he did improve, and his mucus is not totally clear at this point.  He has mild chest congestion, but really does not feel that his breathing is any worse than baseline.  Patient also is concerned about Spiriva causing blurry vision and urinary issues, however he is also concerned about his breathing worsening cough medication.  He also complains of right back pain and is convinced this is due to a "lung issue".  He did have a chest x-ray recently that was unremarkable, and a CT chest in 2011 that was also unrevealing.   Review of Systems  Constitutional: Negative for fever and unexpected weight change.  HENT: Positive for congestion, rhinorrhea, sneezing and postnasal drip. Negative for ear pain, nosebleeds, sore throat, trouble swallowing, dental problem and sinus pressure.   Eyes: Positive for visual disturbance. Negative for redness and itching.  Respiratory: Positive for cough, shortness of breath and wheezing. Negative for chest tightness.   Cardiovascular: Negative for palpitations and leg swelling.  Gastrointestinal: Negative for nausea and vomiting.  Genitourinary: Positive for difficulty urinating. Negative for dysuria.  Musculoskeletal: Negative for joint swelling.  Skin: Negative for rash.  Neurological: Negative for headaches.  Hematological: Does not bruise/bleed easily.  Psychiatric/Behavioral: Negative for dysphoric mood. The patient is not nervous/anxious.        Objective:   Physical Exam Obese male in no acute distress Nose without purulence or discharge noted Oropharynx clear Chest with mild  decreased breath sounds, no wheezes or crackles Heart exam with regular rate and rhythm Lower extremities without edema, no cyanosis noted Alert and oriented, moves all 4 extremities.        Assessment & Plan:

## 2011-02-22 NOTE — Assessment & Plan Note (Signed)
The patient is having a persistent cough with small quantities of purulent mucus, but is not overly short of breath above baseline.  Unfortunately he continues to smoke.  I suspect that he does not have an active infection, but rather more than likely increased airway inflammation.  Rather than treating him with further antibiotics, I would like to give him a course of prednisone to see if this improves.  Regarding his Spiriva, I have asked him to stop this for 2-3 weeks, and if his symptoms do not improve, it is okay to restart.

## 2011-03-02 ENCOUNTER — Ambulatory Visit: Payer: Medicare Other | Admitting: Cardiovascular Disease

## 2011-03-02 ENCOUNTER — Encounter: Payer: Self-pay | Admitting: Cardiovascular Disease

## 2011-03-02 ENCOUNTER — Ambulatory Visit (INDEPENDENT_AMBULATORY_CARE_PROVIDER_SITE_OTHER): Payer: Medicare Other | Admitting: Cardiovascular Disease

## 2011-03-02 DIAGNOSIS — I2510 Atherosclerotic heart disease of native coronary artery without angina pectoris: Secondary | ICD-10-CM

## 2011-03-02 DIAGNOSIS — J438 Other emphysema: Secondary | ICD-10-CM

## 2011-03-02 DIAGNOSIS — R0602 Shortness of breath: Secondary | ICD-10-CM

## 2011-03-02 DIAGNOSIS — E785 Hyperlipidemia, unspecified: Secondary | ICD-10-CM

## 2011-03-02 DIAGNOSIS — R079 Chest pain, unspecified: Secondary | ICD-10-CM

## 2011-03-02 NOTE — Assessment & Plan Note (Addendum)
Major health concern with ongoing smoking and central obesity.  No motivatoin to quit.  "Dr Shelle Iron said once your lungs are bad they cant get better"  If sinus symptoms do not improve off spiriva consider rechallenge with other inhaled steroid.  Avoid pred pac in future as he reacts poorly to them  Echo to assess RV/LV function and estimate PA pressures

## 2011-03-02 NOTE — Assessment & Plan Note (Signed)
Cholesterol is at goal.  Continue current dose of statin and diet Rx.  No myalgias or side effects.  F/U  LFT's in 6 months. Lab Results  Component Value Date   LDLCALC  Value: 113        Total Cholesterol/HDL:CHD Risk Coronary Heart Disease Risk Table                     Men   Women  1/2 Average Risk   3.4   3.3  Average Risk       5.0   4.4  2 X Average Risk   9.6   7.1  3 X Average Risk  23.4   11.0        Use the calculated Patient Ratio above and the CHD Risk Table to determine the patient's CHD Risk.        ATP III CLASSIFICATION (LDL):  <100     mg/dL   Optimal  161-096  mg/dL   Near or Above                    Optimal  130-159  mg/dL   Borderline  045-409  mg/dL   High  >811     mg/dL   Very High* 10/22/7827

## 2011-03-02 NOTE — Assessment & Plan Note (Signed)
SSCP in setting of COPD flair and steroids and elevated BP.  Will have to see if echart has records on old interventions that sound like PBA.  Myovue

## 2011-03-02 NOTE — Progress Notes (Signed)
65 yo previously seen by Dr Stuckey.  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  Still smokes.  Severe COPD sees [facility].  Had flair recently and did not react well to prednisone.  Raised his BP and started having SSCP radiating to left arm.  Spiriva stopped also due to congestion and sinus issues.  Chronic dyspnea. Not on oxygen although he strikes me as the type that would desaturate on a 6 minute walk.  Has neuropathic symptoms in legs and some blurry vision.  Anginal equivalent many years ago was SOB.  Cardura added by Dr Elkins for BP and prostatism.  Had flu shot but got sick after it.  Not sure if he is due for pneumo vax.  CXR 12/11 emphysema and chronic interstitial changes left greater than right  ROS: Denies fever, malais, weight loss, blurry vision, decreased visual acuity, cough, sputum, SOB, hemoptysis, pleuritic pain, palpitaitons, heartburn, abdominal pain, melena, lower extremity edema, claudication, or rash.  All other systems reviewed and negative   General: Affect appropriate Chronically ill obese COPD HEENT: normal Neck supple with no adenopathy JVP normal no bruits no thyromegaly Lungs decreased BS right greater than left with previous thoracotomy Heart:  S1/S2 no murmur,rub, gallop or click PMI normal Abdomen: benighn, BS positve, no tenderness, no AAA no bruit.  No HSM or HJR Distal pulses intact with no bruits No edema Neuro non-focal Skin warm and dry No muscular weakness  Medications Current Outpatient Prescriptions  Medication Sig Dispense Refill  . acetaminophen (TYLENOL) 500 MG tablet Take 500 mg by mouth as needed.      . albuterol (PROAIR HFA) 108 (90 BASE) MCG/ACT inhaler 2 puffs every 4-6 hours as needed       . aspirin 81 MG tablet Take 160 mg by mouth daily.      . doxazosin (CARDURA) 2 MG tablet Take 2 mg by mouth at bedtime.      . Fluticasone-Salmeterol (ADVAIR DISKUS) 250-50 MCG/DOSE AEPB Inhale 1 puff into the lungs 2 (two) times daily.  60 each  5   . guaiFENesin (MUCINEX) 600 MG 12 hr tablet Take 600 mg by mouth 2 (two) times daily. As needed       . levothyroxine (SYNTHROID, LEVOTHROID) 125 MCG tablet Take 125 mcg by mouth daily.        . loratadine (CLARITIN) 10 MG tablet Take 10 mg by mouth daily.      . metFORMIN (GLUMETZA) 500 MG (MOD) 24 hr tablet 1 tablet twice a day       . SPIRIVA HANDIHALER 18 MCG inhalation capsule INHALE CONTENTS OF 1 CAPSULE  DAILY  30 capsule  2    Allergies Sulfonamide derivatives  Family History: Family History  Problem Relation Age of Onset  . Emphysema    . Lung cancer      Social History: History   Social History  . Marital Status: Married    Spouse Name: N/A    Number of Children: N/A  . Years of Education: N/A   Occupational History  . Not on file.   Social History Main Topics  . Smoking status: Current Everyday Smoker -- 2.0 packs/day for 50 years  . Smokeless tobacco: Not on file  . Alcohol Use: Not on file  . Drug Use: Not on file  . Sexually Active: Not on file   Other Topics Concern  . Not on file   Social History Narrative  . No narrative on file    Electrocardiogram: sinus   tachycardia rate 110  LAE ? Old IMI  Assessment and Plan   

## 2011-03-02 NOTE — Patient Instructions (Signed)
Your physician recommends that you schedule a follow-up appointment in: 3-4 WEEKS WITH DR [facility] [place] [facility] ([facility]) Your physician recommends that you continue on your current medications as directed. Please refer to the Current Medication list given to you today. Your physician has requested that you have an echocardiogram. Echocardiography is a painless test that uses sound waves to create images of your heart. It provides your doctor with information about the size and shape of your heart and how well your heart's chambers and valves are working. This procedure takes approximately one hour. There are no restrictions for this procedure. DX SHORTNESS OF BREATH Your physician has requested that you have a lexiscan myoview. For further information please visit [email or web address] Please follow instruction sheet, as given. DX CEHST PAIN

## 2011-03-04 ENCOUNTER — Ambulatory Visit: Payer: Medicare Other | Admitting: Cardiovascular Disease

## 2011-03-08 ENCOUNTER — Ambulatory Visit (HOSPITAL_COMMUNITY): Payer: Medicare Other | Attending: Cardiology | Admitting: Radiology

## 2011-03-08 DIAGNOSIS — R0609 Other forms of dyspnea: Secondary | ICD-10-CM | POA: Insufficient documentation

## 2011-03-08 DIAGNOSIS — R079 Chest pain, unspecified: Secondary | ICD-10-CM | POA: Insufficient documentation

## 2011-03-08 DIAGNOSIS — E785 Hyperlipidemia, unspecified: Secondary | ICD-10-CM | POA: Insufficient documentation

## 2011-03-08 DIAGNOSIS — R0602 Shortness of breath: Secondary | ICD-10-CM | POA: Insufficient documentation

## 2011-03-08 DIAGNOSIS — E119 Type 2 diabetes mellitus without complications: Secondary | ICD-10-CM | POA: Insufficient documentation

## 2011-03-08 DIAGNOSIS — R42 Dizziness and giddiness: Secondary | ICD-10-CM | POA: Insufficient documentation

## 2011-03-08 DIAGNOSIS — Z9861 Coronary angioplasty status: Secondary | ICD-10-CM | POA: Insufficient documentation

## 2011-03-08 DIAGNOSIS — R0989 Other specified symptoms and signs involving the circulatory and respiratory systems: Secondary | ICD-10-CM | POA: Insufficient documentation

## 2011-03-08 DIAGNOSIS — F17200 Nicotine dependence, unspecified, uncomplicated: Secondary | ICD-10-CM | POA: Insufficient documentation

## 2011-03-08 MED ORDER — REGADENOSON 0.4 MG/5ML IV SOLN
0.4000 mg | Freq: Once | INTRAVENOUS | Status: AC
  Administered 2011-03-08: 0.4 mg via INTRAVENOUS

## 2011-03-08 MED ORDER — TECHNETIUM TC 99M TETROFOSMIN IV KIT
11.0000 | PACK | Freq: Once | INTRAVENOUS | Status: AC | PRN
  Administered 2011-03-08: 11 via INTRAVENOUS

## 2011-03-08 MED ORDER — TECHNETIUM TC 99M TETROFOSMIN IV KIT
33.0000 | PACK | Freq: Once | INTRAVENOUS | Status: AC | PRN
  Administered 2011-03-08: 33 via INTRAVENOUS

## 2011-03-08 NOTE — Progress Notes (Signed)
[facility] SITE 3 NUCLEAR MED [address] [place] [address] [phone]  Cardiology Nuclear Med Study  William Dyer is a 66 y.o. male [id number] [birth year]   Nuclear Med Background Indication for Stress Test:  Evaluation for Ischemia History: '91 Angioplasty, '08 ECHO: EF: 60% Cardiac Risk Factors: Lipids, NIDDM and Smoker  Symptoms:  Chest Pain, Dizziness, DOE and SOB   Nuclear Pre-Procedure Caffeine/Decaff Intake:  None NPO After: 10:00pm   Lungs:  clear IV 0.9% NS with Angio Cath:  20g  IV Site: R Antecubital  IV Started by:  Stanton Kidney, EMT-P  Chest Size (in):  46 Cup Size: n/a  Height: 5\' 4"  (1.626 m)  Weight:  199 lb (90.266 kg)  BMI:  Body mass index is 34.16 kg/(m^2). Tech Comments:  CBG= 140 @ 7:35 am, per patient. Breath Sounds- clear, O2 Sat= 95% rm air at (9:30 am today)    Nuclear Med Study 1 or 2 day study: 1 day  Stress Test Type:  Eugenie Birks  Reading MD: Olga Millers, MD  Order Authorizing Provider:  P.Nishan  Resting Radionuclide: Technetium 24m Tetrofosmin  Resting Radionuclide Dose: 11.0 mCi   Stress Radionuclide:  Technetium 63m Tetrofosmin  Stress Radionuclide Dose: 33.0 mCi           Stress Protocol Rest HR: 83 Stress HR: 105  Rest BP: 141/75 Stress BP: 153/82  Exercise Time (min): n/a METS: n/a   Predicted Max HR: 154 bpm % Max HR: 68.18 bpm Rate Pressure Product: 21308   Dose of Adenosine (mg):  n/a Dose of Lexiscan: 0.4 mg  Dose of Atropine (mg): n/a Dose of Dobutamine: n/a mcg/kg/min (at max HR)  Stress Test Technologist: Milana Na, EMT-P  Nuclear Technologist:  Doyne Keel, CNMT     Rest Procedure:  Myocardial perfusion imaging was performed at rest 45 minutes following the intravenous administration of Technetium 70m Tetrofosmin. Rest ECG: NSR  Stress Procedure:  The patient received IV Lexiscan 0.4 mg over 15-seconds.  Technetium 10m Tetrofosmin injected at 30-seconds.  There were no significant  changes, + sob, and chest tightness with Lexiscan.  Quantitative spect images were obtained after a 45 minute delay. Stress ECG: No significant ST segment change suggestive of ischemia.  QPS Raw Data Images:  Acquisition technically good; normal left ventricular size. Stress Images:  There is decreased uptake in the inferior wall and apex. Rest Images:  There is decreased uptake in the inferior wall, less prominent compared to the stress images. Subtraction (SDS):  These findings are consistent with prior inferior infarct and mild peri-infarct ischemia in the inferior wall and apex. Transient Ischemic Dilatation (Normal <1.22):  1.05 Lung/Heart Ratio (Normal <0.45):  0.25  Quantitative Gated Spect Images QGS EDV:  95 ml QGS ESV:  28 ml QGS cine images:  NL LV Function; NL Wall Motion QGS EF: 71%  Impression Exercise Capacity:  Lexiscan with no exercise. BP Response:  Normal blood pressure response. Clinical Symptoms:  There is chest tightness ECG Impression:  No significant ST segment change suggestive of ischemia. Comparison with Prior Nuclear Study: No images to compare  Overall Impression:  Abnormal stress nuclear study with a small to moderate size, partially reversible inferior and apical defect suggestive of prior infarct and mild peri-infarct ischemia.  Olga Millers

## 2011-03-09 ENCOUNTER — Other Ambulatory Visit: Payer: Self-pay | Admitting: Pulmonary Disease

## 2011-03-09 ENCOUNTER — Ambulatory Visit (HOSPITAL_COMMUNITY): Payer: Medicare Other | Attending: Cardiology

## 2011-03-09 DIAGNOSIS — E785 Hyperlipidemia, unspecified: Secondary | ICD-10-CM | POA: Insufficient documentation

## 2011-03-09 DIAGNOSIS — J4489 Other specified chronic obstructive pulmonary disease: Secondary | ICD-10-CM | POA: Insufficient documentation

## 2011-03-09 DIAGNOSIS — R0989 Other specified symptoms and signs involving the circulatory and respiratory systems: Secondary | ICD-10-CM | POA: Insufficient documentation

## 2011-03-09 DIAGNOSIS — R0602 Shortness of breath: Secondary | ICD-10-CM

## 2011-03-09 DIAGNOSIS — J449 Chronic obstructive pulmonary disease, unspecified: Secondary | ICD-10-CM | POA: Insufficient documentation

## 2011-03-09 DIAGNOSIS — I2510 Atherosclerotic heart disease of native coronary artery without angina pectoris: Secondary | ICD-10-CM | POA: Insufficient documentation

## 2011-03-09 DIAGNOSIS — F17200 Nicotine dependence, unspecified, uncomplicated: Secondary | ICD-10-CM | POA: Insufficient documentation

## 2011-03-09 DIAGNOSIS — R079 Chest pain, unspecified: Secondary | ICD-10-CM | POA: Insufficient documentation

## 2011-03-09 DIAGNOSIS — R0609 Other forms of dyspnea: Secondary | ICD-10-CM | POA: Insufficient documentation

## 2011-03-09 DIAGNOSIS — R072 Precordial pain: Secondary | ICD-10-CM

## 2011-03-10 ENCOUNTER — Other Ambulatory Visit: Payer: Self-pay | Admitting: *Deleted

## 2011-03-10 ENCOUNTER — Other Ambulatory Visit (INDEPENDENT_AMBULATORY_CARE_PROVIDER_SITE_OTHER): Payer: Medicare Other | Admitting: *Deleted

## 2011-03-10 ENCOUNTER — Encounter: Payer: Self-pay | Admitting: *Deleted

## 2011-03-10 DIAGNOSIS — R0602 Shortness of breath: Secondary | ICD-10-CM

## 2011-03-10 DIAGNOSIS — R9439 Abnormal result of other cardiovascular function study: Secondary | ICD-10-CM

## 2011-03-10 DIAGNOSIS — Z01810 Encounter for preprocedural cardiovascular examination: Secondary | ICD-10-CM

## 2011-03-10 DIAGNOSIS — R079 Chest pain, unspecified: Secondary | ICD-10-CM

## 2011-03-10 DIAGNOSIS — I2510 Atherosclerotic heart disease of native coronary artery without angina pectoris: Secondary | ICD-10-CM

## 2011-03-10 LAB — CBC WITH DIFFERENTIAL/PLATELET
Basophils Relative: 0.4 % (ref 0.0–3.0)
Eosinophils Relative: 1.3 % (ref 0.0–5.0)
HCT: 44.7 % (ref 39.0–52.0)
Hemoglobin: 15.2 g/dL (ref 13.0–17.0)
Lymphs Abs: 2.2 10*3/uL (ref 0.7–4.0)
MCV: 94.7 fl (ref 78.0–100.0)
Monocytes Absolute: 0.6 10*3/uL (ref 0.1–1.0)
RBC: 4.71 Mil/uL (ref 4.22–5.81)
WBC: 8.4 10*3/uL (ref 4.5–10.5)

## 2011-03-10 LAB — BASIC METABOLIC PANEL
BUN: 12 mg/dL (ref 6–23)
CO2: 27 mEq/L (ref 19–32)
Chloride: 102 mEq/L (ref 96–112)
Glucose, Bld: 117 mg/dL — ABNORMAL HIGH (ref 70–99)
Potassium: 3.9 mEq/L (ref 3.5–5.1)

## 2011-03-11 ENCOUNTER — Encounter: Payer: Self-pay | Admitting: *Deleted

## 2011-03-11 ENCOUNTER — Other Ambulatory Visit: Payer: Self-pay | Admitting: *Deleted

## 2011-03-11 MED ORDER — NITROGLYCERIN 0.4 MG SL SUBL: 0.4000 mg | SUBLINGUAL_TABLET | SUBLINGUAL | Status: DC | PRN

## 2011-03-12 ENCOUNTER — Other Ambulatory Visit: Payer: Self-pay | Admitting: Cardiovascular Disease

## 2011-03-14 ENCOUNTER — Encounter (HOSPITAL_BASED_OUTPATIENT_CLINIC_OR_DEPARTMENT_OTHER)
Admission: RE | Disposition: A | Discharge status: Hospice | Payer: Self-pay | Source: Ambulatory Visit | Attending: Cardiovascular Disease

## 2011-03-14 ENCOUNTER — Other Ambulatory Visit: Payer: Self-pay

## 2011-03-14 ENCOUNTER — Inpatient Hospital Stay (HOSPITAL_COMMUNITY)
Admission: AD | Admit: 2011-03-14 | Discharge: 2011-03-25 | DRG: 236 | Disposition: A | Discharge status: Home Health | Payer: Medicare Other | Source: Ambulatory Visit | Attending: Cardiothoracic Surgery | Admitting: Cardiothoracic Surgery

## 2011-03-14 ENCOUNTER — Inpatient Hospital Stay (HOSPITAL_BASED_OUTPATIENT_CLINIC_OR_DEPARTMENT_OTHER)
Admission: RE | Admit: 2011-03-14 | Discharge: 2011-03-14 | Disposition: A | Discharge status: Hospice | Payer: Medicare Other | Source: Ambulatory Visit | Attending: Cardiovascular Disease | Admitting: Cardiovascular Disease

## 2011-03-14 ENCOUNTER — Encounter (HOSPITAL_COMMUNITY): Payer: Self-pay | Admitting: General Practice

## 2011-03-14 ENCOUNTER — Other Ambulatory Visit (HOSPITAL_COMMUNITY): Payer: Self-pay | Admitting: Vascular Surgery

## 2011-03-14 DIAGNOSIS — E669 Obesity, unspecified: Secondary | ICD-10-CM | POA: Diagnosis present

## 2011-03-14 DIAGNOSIS — I2510 Atherosclerotic heart disease of native coronary artery without angina pectoris: Secondary | ICD-10-CM

## 2011-03-14 DIAGNOSIS — F17200 Nicotine dependence, unspecified, uncomplicated: Secondary | ICD-10-CM | POA: Diagnosis present

## 2011-03-14 DIAGNOSIS — D72829 Elevated white blood cell count, unspecified: Secondary | ICD-10-CM | POA: Diagnosis not present

## 2011-03-14 DIAGNOSIS — Z85118 Personal history of other malignant neoplasm of bronchus and lung: Secondary | ICD-10-CM

## 2011-03-14 DIAGNOSIS — I200 Unstable angina: Secondary | ICD-10-CM | POA: Insufficient documentation

## 2011-03-14 DIAGNOSIS — Z79899 Other long term (current) drug therapy: Secondary | ICD-10-CM

## 2011-03-14 DIAGNOSIS — Z9981 Dependence on supplemental oxygen: Secondary | ICD-10-CM

## 2011-03-14 DIAGNOSIS — E119 Type 2 diabetes mellitus without complications: Secondary | ICD-10-CM | POA: Diagnosis present

## 2011-03-14 DIAGNOSIS — J9819 Other pulmonary collapse: Secondary | ICD-10-CM | POA: Diagnosis not present

## 2011-03-14 DIAGNOSIS — E039 Hypothyroidism, unspecified: Secondary | ICD-10-CM | POA: Diagnosis present

## 2011-03-14 DIAGNOSIS — Z7982 Long term (current) use of aspirin: Secondary | ICD-10-CM

## 2011-03-14 DIAGNOSIS — J4489 Other specified chronic obstructive pulmonary disease: Secondary | ICD-10-CM | POA: Diagnosis present

## 2011-03-14 DIAGNOSIS — E8779 Other fluid overload: Secondary | ICD-10-CM | POA: Diagnosis not present

## 2011-03-14 DIAGNOSIS — Z9861 Coronary angioplasty status: Secondary | ICD-10-CM

## 2011-03-14 DIAGNOSIS — N400 Enlarged prostate without lower urinary tract symptoms: Secondary | ICD-10-CM | POA: Diagnosis present

## 2011-03-14 DIAGNOSIS — J449 Chronic obstructive pulmonary disease, unspecified: Secondary | ICD-10-CM | POA: Diagnosis present

## 2011-03-14 DIAGNOSIS — D62 Acute posthemorrhagic anemia: Secondary | ICD-10-CM | POA: Diagnosis not present

## 2011-03-14 DIAGNOSIS — K5900 Constipation, unspecified: Secondary | ICD-10-CM | POA: Diagnosis not present

## 2011-03-14 DIAGNOSIS — D696 Thrombocytopenia, unspecified: Secondary | ICD-10-CM | POA: Diagnosis not present

## 2011-03-14 HISTORY — DX: Cardiac murmur, unspecified: R01.1

## 2011-03-14 HISTORY — DX: Gastro-esophageal reflux disease without esophagitis: K21.9

## 2011-03-14 HISTORY — DX: Hypothyroidism, unspecified: E03.9

## 2011-03-14 HISTORY — DX: Chronic obstructive pulmonary disease, unspecified: J44.9

## 2011-03-14 HISTORY — DX: Pneumonia, unspecified organism: J18.9

## 2011-03-14 LAB — LIPID PANEL
Cholesterol: 286 mg/dL — ABNORMAL HIGH (ref 0–200)
HDL: 51 mg/dL (ref >39)
LDL Cholesterol: UNDETERMINED mg/dL (ref 0–99)
Total CHOL/HDL Ratio: 5.6 RATIO
Triglycerides: 572 mg/dL — ABNORMAL HIGH (ref <150)
VLDL: UNDETERMINED mg/dL (ref 0–40)

## 2011-03-14 LAB — URINALYSIS, ROUTINE W REFLEX MICROSCOPIC
Bilirubin Urine: NEGATIVE
Glucose, UA: NEGATIVE mg/dL
Hgb urine dipstick: NEGATIVE
Ketones, ur: NEGATIVE mg/dL
Leukocytes, UA: NEGATIVE
Nitrite: NEGATIVE
Protein, ur: NEGATIVE mg/dL
Specific Gravity, Urine: 1.008 (ref 1.005–1.030)
Urobilinogen, UA: 0.2 mg/dL (ref 0.0–1.0)
pH: 5.5 (ref 5.0–8.0)

## 2011-03-14 LAB — POCT I-STAT GLUCOSE: Operator id: 141321

## 2011-03-14 SURGERY — JV LEFT HEART CATHETERIZATION WITH CORONARY ANGIOGRAM
Anesthesia: Moderate Sedation

## 2011-03-14 MED ORDER — NITROGLYCERIN 0.4 MG SL SUBL: 0.4000 mg | SUBLINGUAL_TABLET | SUBLINGUAL | Status: DC | PRN

## 2011-03-14 MED ORDER — SODIUM CHLORIDE 0.9 % IV SOLN: 250.0000 mL | INTRAVENOUS | Status: DC | PRN

## 2011-03-14 MED ORDER — TIOTROPIUM BROMIDE MONOHYDRATE 18 MCG IN CAPS: 18.0000 ug | ORAL_CAPSULE | Freq: Every day | RESPIRATORY_TRACT | Status: DC

## 2011-03-14 MED ORDER — SODIUM CHLORIDE 0.9 % IV SOLN: INTRAVENOUS | Status: AC

## 2011-03-14 MED ORDER — LORATADINE 10 MG PO TABS: 10.0000 mg | ORAL_TABLET | Freq: Every day | ORAL | Status: DC

## 2011-03-14 MED ORDER — FLUTICASONE-SALMETEROL 250-50 MCG/DOSE IN AEPB: 1.0000 | INHALATION_SPRAY | Freq: Two times a day (BID) | RESPIRATORY_TRACT | Status: DC

## 2011-03-14 MED ORDER — ALBUTEROL SULFATE HFA 108 (90 BASE) MCG/ACT IN AERS
2.0000 | INHALATION_SPRAY | RESPIRATORY_TRACT | Status: DC | PRN
  Filled 2011-03-14: qty 6.7

## 2011-03-14 MED ORDER — ASPIRIN EC 81 MG PO TBEC
81.0000 mg | DELAYED_RELEASE_TABLET | Freq: Every day | ORAL | Status: DC
  Administered 2011-03-14 – 2011-03-17 (×4): 81 mg via ORAL
  Filled 2011-03-14 (×5): qty 1

## 2011-03-14 MED ORDER — LEVOTHYROXINE SODIUM 125 MCG PO TABS: 125.0000 ug | ORAL_TABLET | Freq: Every day | ORAL | Status: DC

## 2011-03-14 MED ORDER — NICOTINE 21 MG/24HR TD PT24: 21.0000 mg | MEDICATED_PATCH | Freq: Every day | TRANSDERMAL | Status: DC

## 2011-03-14 MED ORDER — SODIUM CHLORIDE 0.9 % IV SOLN: INTRAVENOUS | Status: DC

## 2011-03-14 MED ORDER — ONDANSETRON HCL 4 MG/2ML IJ SOLN: 4.0000 mg | Freq: Four times a day (QID) | INTRAMUSCULAR | Status: DC | PRN

## 2011-03-14 MED ORDER — DIAZEPAM 5 MG PO TABS
5.0000 mg | ORAL_TABLET | Freq: Four times a day (QID) | ORAL | Status: DC | PRN
  Administered 2011-03-14: 5 mg via ORAL

## 2011-03-14 MED ORDER — ASPIRIN 81 MG PO CHEW
324.0000 mg | CHEWABLE_TABLET | ORAL | Status: AC
  Administered 2011-03-14: 324 mg via ORAL

## 2011-03-14 MED ORDER — ASPIRIN 81 MG PO TABS: 81.0000 mg | ORAL_TABLET | Freq: Every day | ORAL | Status: DC

## 2011-03-14 MED ORDER — FLUTICASONE-SALMETEROL 250-50 MCG/DOSE IN AEPB
1.0000 | INHALATION_SPRAY | Freq: Two times a day (BID) | RESPIRATORY_TRACT | Status: DC
  Administered 2011-03-14 – 2011-03-25 (×17): 1 via RESPIRATORY_TRACT
  Filled 2011-03-14 (×2): qty 14

## 2011-03-14 MED ORDER — DOXAZOSIN MESYLATE 2 MG PO TABS: 2.0000 mg | ORAL_TABLET | Freq: Every day | ORAL | Status: DC

## 2011-03-14 MED ORDER — TIOTROPIUM BROMIDE MONOHYDRATE 18 MCG IN CAPS
18.0000 ug | ORAL_CAPSULE | Freq: Every day | RESPIRATORY_TRACT | Status: DC
  Administered 2011-03-15 – 2011-03-25 (×8): 18 ug via RESPIRATORY_TRACT
  Filled 2011-03-14 (×2): qty 5

## 2011-03-14 MED ORDER — DOXAZOSIN MESYLATE 2 MG PO TABS
2.0000 mg | ORAL_TABLET | Freq: Every day | ORAL | Status: DC
  Administered 2011-03-14 – 2011-03-17 (×4): 2 mg via ORAL
  Filled 2011-03-14 (×6): qty 1

## 2011-03-14 MED ORDER — ALPRAZOLAM 0.25 MG PO TABS
0.2500 mg | ORAL_TABLET | Freq: Two times a day (BID) | ORAL | Status: DC | PRN
  Administered 2011-03-14: 0.25 mg via ORAL
  Filled 2011-03-14: qty 1

## 2011-03-14 MED ORDER — ACETAMINOPHEN 500 MG PO TABS: 500.0000 mg | ORAL_TABLET | ORAL | Status: DC | PRN

## 2011-03-14 MED ORDER — ALBUTEROL SULFATE HFA 108 (90 BASE) MCG/ACT IN AERS: 1.0000 | INHALATION_SPRAY | RESPIRATORY_TRACT | Status: DC | PRN

## 2011-03-14 MED ORDER — NICOTINE 21 MG/24HR TD PT24
21.0000 mg | MEDICATED_PATCH | Freq: Every day | TRANSDERMAL | Status: DC
  Administered 2011-03-14: 21 mg via TRANSDERMAL
  Filled 2011-03-14: qty 1

## 2011-03-14 MED ORDER — LEVOTHYROXINE SODIUM 125 MCG PO TABS
125.0000 ug | ORAL_TABLET | Freq: Every day | ORAL | Status: DC
  Administered 2011-03-15 – 2011-03-25 (×10): 125 ug via ORAL
  Filled 2011-03-14 (×12): qty 1

## 2011-03-14 MED ORDER — ZOLPIDEM TARTRATE 5 MG PO TABS: 5.0000 mg | ORAL_TABLET | Freq: Every evening | ORAL | Status: DC | PRN

## 2011-03-14 MED ORDER — LORATADINE 10 MG PO TABS
10.0000 mg | ORAL_TABLET | Freq: Every day | ORAL | Status: DC
  Administered 2011-03-15 – 2011-03-25 (×10): 10 mg via ORAL
  Filled 2011-03-14 (×11): qty 1

## 2011-03-14 MED ORDER — SODIUM CHLORIDE 0.9 % IJ SOLN
3.0000 mL | Freq: Two times a day (BID) | INTRAMUSCULAR | Status: DC
  Administered 2011-03-14 – 2011-03-17 (×7): 3 mL via INTRAVENOUS

## 2011-03-14 MED ORDER — NICOTINE 21 MG/24HR TD PT24
21.0000 mg | MEDICATED_PATCH | Freq: Every day | TRANSDERMAL | Status: DC
  Administered 2011-03-14 – 2011-03-17 (×4): 21 mg via TRANSDERMAL
  Filled 2011-03-14 (×6): qty 1

## 2011-03-14 MED ORDER — ACETAMINOPHEN 325 MG PO TABS: 650.0000 mg | ORAL_TABLET | ORAL | Status: DC | PRN

## 2011-03-14 MED ORDER — SODIUM CHLORIDE 0.9 % IJ SOLN: 3.0000 mL | INTRAMUSCULAR | Status: DC | PRN

## 2011-03-14 MED ORDER — SODIUM CHLORIDE 0.9 % IJ SOLN: 3.0000 mL | Freq: Two times a day (BID) | INTRAMUSCULAR | Status: DC

## 2011-03-14 NOTE — OR Nursing (Signed)
Dr McAlhany at bedside to discuss results and treatment plan with pt and family 

## 2011-03-14 NOTE — OR Nursing (Signed)
Report called to Eber Jones, RN on 4700, pt transported to 4738 via stretcher, on monitor

## 2011-03-14 NOTE — H&P (View-Only) (Signed)
66 yo previously seen by Dr Riley Kill.  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  Still smokes.  Severe COPD sees Clance.  Had flair recently and did not react well to prednisone.  Raised his BP and started having SSCP radiating to left arm.  Spiriva stopped also due to congestion and sinus issues.  Chronic dyspnea. Not on oxygen although he strikes me as the type that would desaturate on a 6 minute walk.  Has neuropathic symptoms in legs and some blurry vision.  Anginal equivalent many years ago was SOB.  Cardura added by Dr [name] for BP and prostatism.  Had flu shot but got sick after it.  Not sure if he is due for pneumo vax.  CXR 12/11 emphysema and chronic interstitial changes left greater than right  ROS: Denies fever, malais, weight loss, blurry vision, decreased visual acuity, cough, sputum, SOB, hemoptysis, pleuritic pain, palpitaitons, heartburn, abdominal pain, melena, lower extremity edema, claudication, or rash.  All other systems reviewed and negative   General: Affect appropriate Chronically ill obese COPD HEENT: normal Neck supple with no adenopathy JVP normal no bruits no thyromegaly Lungs decreased BS right greater than left with previous thoracotomy Heart:  S1/S2 no murmur,rub, gallop or click PMI normal Abdomen: benighn, BS positve, no tenderness, no AAA no bruit.  No HSM or HJR Distal pulses intact with no bruits No edema Neuro non-focal Skin warm and dry No muscular weakness  Medications Current Outpatient Prescriptions  Medication Sig Dispense Refill  . acetaminophen (TYLENOL) 500 MG tablet Take 500 mg by mouth as needed.      Marland Kitchen albuterol (PROAIR HFA) 108 (90 BASE) MCG/ACT inhaler 2 puffs every 4-6 hours as needed       . aspirin 81 MG tablet Take 160 mg by mouth daily.      Marland Kitchen doxazosin (CARDURA) 2 MG tablet Take 2 mg by mouth at bedtime.      . Fluticasone-Salmeterol (ADVAIR DISKUS) 250-50 MCG/DOSE AEPB Inhale 1 puff into the lungs 2 (two) times daily.  60 each  5   . guaiFENesin (MUCINEX) 600 MG 12 hr tablet Take 600 mg by mouth 2 (two) times daily. As needed       . levothyroxine (SYNTHROID, LEVOTHROID) 125 MCG tablet Take 125 mcg by mouth daily.        Marland Kitchen loratadine (CLARITIN) 10 MG tablet Take 10 mg by mouth daily.      . metFORMIN (GLUMETZA) 500 MG (MOD) 24 hr tablet 1 tablet twice a day       . SPIRIVA HANDIHALER 18 MCG inhalation capsule INHALE CONTENTS OF 1 CAPSULE  DAILY  30 capsule  2    Allergies Sulfonamide derivatives  Family History: Family History  Problem Relation Age of Onset  . Emphysema    . Lung cancer      Social History: History   Social History  . Marital Status: Married    Spouse Name: N/A    Number of Children: N/A  . Years of Education: N/A   Occupational History  . Not on file.   Social History Main Topics  . Smoking status: Current Everyday Smoker -- 2.0 packs/day for 50 years  . Smokeless tobacco: Not on file  . Alcohol Use: Not on file  . Drug Use: Not on file  . Sexually Active: Not on file   Other Topics Concern  . Not on file   Social History Narrative  . No narrative on file    Electrocardiogram: sinus  tachycardia rate 110  LAE ? Old IMI  Assessment and Plan

## 2011-03-14 NOTE — OR Nursing (Signed)
Ambulated in hall without difficulty, site level 0, pt up to chair

## 2011-03-14 NOTE — Interval H&P Note (Signed)
History and Physical Interval Note:  03/14/2011 9:42 AM  William Dyer  has presented today for surgery, with the diagnosis of Chest Pain Abnormal Myovue  The various methods of treatment have been discussed with the patient and family. After consideration of risks, benefits and other options for treatment, the patient has consented to  Procedure(s): JV LEFT HEART CATHETERIZATION WITH CORONARY ANGIOGRAM as a surgical intervention .  The patients' history has been reviewed, patient examined, no change in status, stable for surgery.  I have reviewed the patients' chart and labs.  Questions were answered to the patient's satisfaction.     Bradee Common

## 2011-03-14 NOTE — Op Note (Signed)
Cardiac Catheterization Operative Report  William Dyer [id number] 2/4/201310:13 AM [name], MD, MD  Procedure Performed:  1. Left Heart Catheterization 2. Selective Coronary Angiography 3. Left ventricular angiogram  Operator: Verne Carrow, MD  Indication: Chest pain, dyspnea. Stress myoview with inferior and apical ischemia.                                     Procedure Details: The risks, benefits, complications, treatment options, and expected outcomes were discussed with the patient. The patient and/or family concurred with the proposed plan, giving informed consent. The patient was brought to the cath lab after IV hydration was begun and oral premedication was given. The patient was further sedated with Versed and Fentanyl. The right groin was prepped and draped in the usual manner. Using the modified Seldinger access technique, a 4 French sheath was placed in the right femoral artery. Standard diagnostic catheters were used to perform selective coronary angiography. A pigtail catheter was used to perform a left ventricular angiogram.  There were no immediate complications. The patient was taken to the recovery area in stable condition.   Hemodynamic Findings: Central aortic pressure: 122/69 Left ventricular pressure: 123/19/18  Angiographic Findings:  Left main: No obstructive disease.   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 This is a long stenosis involving the proximal vessel and mid vessel. There is a large branch that is occluded and while it could represent a diagonal, it has an unusual distribution. 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 The distal vessel is free of disease.   Circumflex Artery: Moderate sized vessel with moderate sized OM branch. No obstructive disease.   Right Coronary Artery: Small, non-dominant vessel with 100% mid occlusion. Distal vessel fills from right to right bridging collaterals.    Left Ventricular Angiogram: LVEF 60-65%.    Impression: 1. 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  2. Preserved LV systolic function.  3. Unstable angina.   Recommendations: He has complex disease in his mid LAD with a long segment of severe stenosis followed by an aneurysmal segment. There is a large branch that is occluded. Will admit pt and have CT surgery evaluate for possible CABG given complexity of LAD lesion which would not be favorable for PCI.        Complications:  None. The patient tolerated the procedure well.

## 2011-03-14 NOTE — Plan of Care (Signed)
Problem: Phase I Progression Outcomes Goal: Vascular site scale level 0 - I Vascular Site Scale Level 0: No bruising/bleeding/hematoma Level I (Mild): Bruising/Ecchymosis, minimal bleeding/ooozing, palpable hematoma < 3 cm Level II (Moderate): Bleeding not affecting hemodynamic parameters, pseudoaneurysm, palpable hematoma > 3 cm Outcome: Completed/Met Date Met:  03/14/11 Level 0

## 2011-03-14 NOTE — OR Nursing (Signed)
Tegaderm dressing applied, site level 0, bedrest begins at 1035 

## 2011-03-14 NOTE — Progress Notes (Signed)
  Subjective:                    [address]            [place] [address]          [phone]    Dyspnea on exertion  Severe 2 vessel CAD by cath  Objective: Vital signs in last 24 hours: Temp:  [97.4 F (36.3 C)-97.7 F (36.5 C)] 97.7 F (36.5 C) (02/04 1846) Pulse Rate:  [69-87] 69  (02/04 1846) Cardiac Rhythm:  [-]  Resp:  [20] 20  (02/04 1846) BP: (123-148)/(65-84) 146/72 mmHg (02/04 1846) SpO2:  [83 %-96 %] 96 % (02/04 1846) Weight:  [199 lb (90.266 kg)-199 lb 1.2 oz (90.3 kg)] 199 lb 1.2 oz (90.3 kg) (02/04 1530)  Hemodynamic parameters for last 24 hours:  NSR   Afebrile  Intake/Output from previous day:   Intake/Output this shift: Total I/O In: -  Out: 400 [Urine:400]  EXAM   Distant breath sound, no murmur,no edema  Lab Results: No results found for this basename: WBC:2,HGB:2,HCT:2,PLT:2 in the last 72 hours BMET:  Basename 03/14/11 1016  NA --  K --  CL --  CO2 --  GLUCOSE 134*  BUN --  CREATININE --  CALCIUM --    PT/INR: No results found for this basename: LABPROT,INR in the last 72 hours ABG    Component Value Date/Time   PHART 7.356 04/19/2009 1140   HCO3 27.3* 04/19/2009 1140   TCO2 28.9 04/19/2009 1140   O2SAT 91.5 04/19/2009 1140   CBG (last 3)  No results found for this basename: GLUCAP:3 in the last 72 hours  Assessment/Plan: S/P  LHC  Plan --  CABG 2-8 -013    preop PFTs, pulm tune-up    LOS: 0 days    VAN TRIGT III,PETER 03/14/2011

## 2011-03-15 ENCOUNTER — Inpatient Hospital Stay (HOSPITAL_COMMUNITY): Payer: Medicare Other

## 2011-03-15 ENCOUNTER — Other Ambulatory Visit (HOSPITAL_COMMUNITY): Payer: Self-pay | Admitting: Radiology

## 2011-03-15 ENCOUNTER — Other Ambulatory Visit: Payer: Self-pay

## 2011-03-15 ENCOUNTER — Encounter (HOSPITAL_COMMUNITY): Payer: Self-pay

## 2011-03-15 DIAGNOSIS — I2510 Atherosclerotic heart disease of native coronary artery without angina pectoris: Principal | ICD-10-CM

## 2011-03-15 DIAGNOSIS — Z01810 Encounter for preprocedural cardiovascular examination: Secondary | ICD-10-CM

## 2011-03-15 LAB — BASIC METABOLIC PANEL
CO2: 26 mEq/L (ref 19–32)
Chloride: 101 mEq/L (ref 96–112)
Glucose, Bld: 146 mg/dL — ABNORMAL HIGH (ref 70–99)
Potassium: 3.8 mEq/L (ref 3.5–5.1)
Sodium: 136 mEq/L (ref 135–145)

## 2011-03-15 LAB — CBC
Hemoglobin: 15.3 g/dL (ref 13.0–17.0)
Platelets: 168 10*3/uL (ref 150–400)
RBC: 4.78 MIL/uL (ref 4.22–5.81)
WBC: 8 10*3/uL (ref 4.0–10.5)

## 2011-03-15 LAB — GLUCOSE, CAPILLARY: Glucose-Capillary: 184 mg/dL — ABNORMAL HIGH (ref 70–99)

## 2011-03-15 MED ORDER — ALPRAZOLAM 0.25 MG PO TABS
0.2500 mg | ORAL_TABLET | ORAL | Status: DC | PRN
  Administered 2011-03-15 – 2011-03-17 (×3): 0.25 mg via ORAL
  Filled 2011-03-15 (×2): qty 1
  Filled 2011-03-15: qty 2

## 2011-03-15 MED ORDER — IOHEXOL 300 MG/ML  SOLN
100.0000 mL | Freq: Once | INTRAMUSCULAR | Status: AC | PRN
  Administered 2011-03-15: 100 mL via INTRAVENOUS

## 2011-03-15 MED ORDER — ROSUVASTATIN CALCIUM 20 MG PO TABS
20.0000 mg | ORAL_TABLET | Freq: Every day | ORAL | Status: DC
  Administered 2011-03-15 – 2011-03-21 (×6): 20 mg via ORAL
  Filled 2011-03-15 (×8): qty 1

## 2011-03-15 MED ORDER — ALBUTEROL SULFATE (5 MG/ML) 0.5% IN NEBU
2.5000 mg | INHALATION_SOLUTION | Freq: Once | RESPIRATORY_TRACT | Status: AC
  Administered 2011-03-15: 2.5 mg via RESPIRATORY_TRACT

## 2011-03-15 NOTE — Consult Note (Signed)
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 Initially he said he wanted to quit but didn't think that he could. After much talk and explanation as well as reassuring the pt that I could confidently help him quit but needed his willingness pt became interested and willing to try my instructions. Recommended to start with 42 mg patch ( two 21 mg patches). Discussed and wrote down instructions for pt on patch use instructions including the tapering. Pt seems reassured and willing to try. Referred to 1-800 quit now for f/u and support. Discussed oral fixation substitutes, second hand smoke and in home smoking policy. Reviewed and gave pt Written education/contact information.

## 2011-03-15 NOTE — Consults (Signed)
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 The patient has had class III symptoms of angina and dyspnea. Cardiac catheterization by Dr. Veda Canning demonstrated 90% LAD stenosis, 90% RCA stenosis. He was not a good candidate for PCI and cardiac surgical evaluation was requested. Patient is currently stable in the hospital. A 2-D echo has been performed showing no significant valvular heart disease. The patient is in a sinus rhythm.  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   Home medications Metformin 500 twice a day Spiriva 18 mcg daily Claritin daily Synthroid 125 mcg daily Mucinex 600 twice a day Advair 250/50 one puff twice a day Cardura 2 mg daily albuterol inhaler 3 times a day aspirin 81 mg daily  Social history The patient is retired Education administrator. He is married. He smokes one to 2 packs of cigarettes a day. He denies alcohol intake.   Review of systems Constitutional review negative for fever weight loss HEENT review significant for upper and partial lower dental plates no difficulty swallowing no dental complaints no change in vision Thorax no recent symptoms of upper respiratory infection. He did have bronchopneumonia around Christmas time treated with outpatient antibiotics. He did have right lower lobe wedge resection for adenocarcinoma 2001 he did not require or receive postoperative chemotherapy. A CT scan 2011 showed scarring in the right lower lobe but  no evidence recurrent disease. Cardiac positive for two-vessel CAD. The patient remotely had PCI of his coronaries over 10 years ago GI no history of cirrhosis jaundice gallstones blood per rectum Endocrine positive diabetes or thyroid disease Vascular negative for DVT TIA claudication carotid duplex studies pending Hematologic no bleeding disorder no recent blood transfusion  Family history positive for lung cancer positive for emphysema  Physical exam Vital signs afebrile oxygen saturation 94% room air blood pressure 132/78 heart rate 94 HEENT normocephalic pupils equal Neck without JVD mass or bruit no palpable cervical adenopathy Cardiac regular rhythm without murmur or gallop Lungs clear, well-healed right posterior lateral thoracotomy incision Abdomen obese soft nontender without pulsatile mass Extremities mild clubbing no cyanosis edema or tenderness no evidence of varicosities of the legs Neurologic alert and oriented without focal motor deficit normal gait  Laboratory data cardiac cath and coronary arteriogram is in 2-D echo reviewed. He has severe two-vessel coronary disease with preserved LV systolic function. CT scan of the chest is pending. PFTs and room air ABGs are pending.  Impression plan the patient would benefit from surgical coronary vegetation with optimization of his coronary status proximally. This will include smoking cessation continue with his inhalers and probable pulmonary consultation. The patient is been seen by Dr. Stann Mainland the past for his COPD.  Surgery scheduled for Friday, February 10.

## 2011-03-15 NOTE — Plan of Care (Signed)
Problem: Food- and Nutrition-Related Knowledge Deficit (NB-1.1) Goal: Nutrition education Formal process to instruct or train a patient/client in a skill or to impart knowledge to help patients/clients voluntarily manage or modify food choices and eating behavior to maintain or improve health.  Outcome: Completed/Met Date Met:  03/15/11 Patient is a 66 y.o. Male. Reviewed chart and patient presents with hyperlipidemia. RD was consulted for a diet education on a heart healthy diet. A 24-recall showed patient consumed high-sodium foods for breakfast, lunch, and dinner. There also appeared to be little fruit and vegetable intake. Patient also reported being a diabetic made it difficult for him to consume "white" foods. Using the Nutrition Care Manual handout on heart healthy diet, RD discussed incorporating more fruits and vegetables in diet, increasing fiber intake, different lower-sodium options for lunches, and use of healthier oils. Patient appeared not very willing to make many dietary changes. Compliance to heart healthy diet after discharge appears to be minimal.

## 2011-03-15 NOTE — Progress Notes (Signed)
Patient ID: William Dyer, male   DOB: 08-10-1945, 66 y.o.   MRN: [id number] @ Subjective:  Denies SSCP, palpitations or Dyspnea   Objective:  Filed Vitals:   03/14/11 1846 03/14/11 2136 03/14/11 2201 03/15/11 0500  BP: 146/72 142/75  118/73  Pulse: 69 83  85  Temp: 97.7 F (36.5 C) 97.8 F (36.6 C)  97.7 F (36.5 C)  TempSrc:  Oral  Oral  Resp: 20 18  20   Height:      Weight:    88.9 kg (195 lb 15.8 oz)  SpO2: 96% 95% 92% 96%    Intake/Output from previous day:  Intake/Output Summary (Last 24 hours) at 03/15/11 0810 Last data filed at 03/15/11 0735  Gross per 24 hour  Intake   1200 ml  Output   2400 ml  Net  -1200 ml    Physical Exam: Affect appropriate Healthy:  appears stated age HEENT: normal Neck supple with no adenopathy JVP normal no bruits no thyromegaly Lungs clear with no wheezing and good diaphragmatic motion Heart:  S1/S2 no murmur, no rub, gallop or click PMI normal Abdomen: benighn, BS positve, no tenderness, no AAA no bruit.  No HSM or HJR Distal pulses intact with no bruits No edema Neuro non-focal Skin warm and dry No muscular weakness   Lab Results: Basic Metabolic Panel:  Basename 03/15/11 0530 03/14/11 1016  NA 136 --  K 3.8 --  CL 101 --  CO2 26 --  GLUCOSE 146* 134*  BUN 15 --  CREATININE 0.76 --  CALCIUM 9.2 --  MG -- --  PHOS -- --   Liver Function Tests: No results found for this basename: AST:2,ALT:2,ALKPHOS:2,BILITOT:2,PROT:2,ALBUMIN:2 in the last 72 hours No results found for this basename: LIPASE:2,AMYLASE:2 in the last 72 hours CBC:  Basename 03/15/11 0530  WBC 8.0  NEUTROABS --  HGB 15.3  HCT 44.9  MCV 93.9  PLT 168   Fasting Lipid Panel:  Basename 03/14/11 1750  CHOL 286*  HDL 51  LDLCALC UNABLE TO CALCULATE IF TRIGLYCERIDE OVER 400 mg/dL  TRIG 956*  CHOLHDL 5.6  LDLDIRECT --    Imaging: No results found.  Cardiac Studies:  ECG:  NSR no acute changes   Telemetry:  NSR  Echo:    Medications:     . aspirin EC  81 mg Oral Daily  . doxazosin  2 mg Oral QHS  . Fluticasone-Salmeterol  1 puff Inhalation Q12H  . levothyroxine  125 mcg Oral QAC breakfast  . loratadine  10 mg Oral Daily  . nicotine  21 mg Transdermal Daily  . sodium chloride  3 mL Intravenous Q12H  . tiotropium  18 mcg Inhalation Daily  . DISCONTD: aspirin  81 mg Oral Daily       Assessment/Plan:  CAD:  Discussed with PVT earliest he can do is Friday.  Wants him to stay in hospital so he wont smoke Chol:  Diet consult start statin Smoking cessaton:  Consult  [name] 03/15/2011, 8:10 AM

## 2011-03-15 NOTE — Progress Notes (Signed)
RT in pulmonary lab attempted ABG prior to PFT and was unsuccessful. Pt refused ABG stick by another RT second time.

## 2011-03-15 NOTE — Progress Notes (Signed)
UR Completed. [name], [name] F [phone]  

## 2011-03-15 NOTE — Progress Notes (Addendum)
PRELIMINARY  PRELIMINARY  PRELIMINARY   Pre-op Cardiac Surgery  Carotid Findings:  Mild plaque disease, but no significant ICA stenosis bilaterally.  Upper Extremity Right Left  Brachial Pressures 134 140  Radial Waveforms triphasic triphasic  Ulnar Waveforms triphasic triphasic  Palmar Arch (Allen's Test) normal normal   Findings:  Normal upper extremity arterial evaluation with normal Palmar arch signals bilaterally.    Lower  Extremity Right Left  Dorsalis Pedis    Anterior Tibial    Posterior Tibial    Ankle/Brachial Indices      Findings:  Palpable pedal pulses X 4.   William Dyer 03/15/2011 5:04 PM

## 2011-03-16 DIAGNOSIS — I2510 Atherosclerotic heart disease of native coronary artery without angina pectoris: Secondary | ICD-10-CM

## 2011-03-16 LAB — CBC
HCT: 44.9 % (ref 39.0–52.0)
Hemoglobin: 15.5 g/dL (ref 13.0–17.0)
MCH: 32.2 pg (ref 26.0–34.0)
MCHC: 34.5 g/dL (ref 30.0–36.0)
MCV: 93.3 fL (ref 78.0–100.0)
Platelets: 161 10*3/uL (ref 150–400)
RBC: 4.81 MIL/uL (ref 4.22–5.81)
RDW: 14.6 % (ref 11.5–15.5)
WBC: 8.3 10*3/uL (ref 4.0–10.5)

## 2011-03-16 LAB — ABO/RH: ABO/RH(D): A NEG

## 2011-03-16 MED ORDER — CHLORHEXIDINE GLUCONATE 4 % EX LIQD
60.0000 mL | Freq: Once | CUTANEOUS | Status: DC
  Filled 2011-03-16: qty 60

## 2011-03-16 MED ORDER — CHLORHEXIDINE GLUCONATE 4 % EX LIQD
60.0000 mL | Freq: Once | CUTANEOUS | Status: AC
  Administered 2011-03-17: 4 via TOPICAL
  Filled 2011-03-16: qty 60

## 2011-03-16 MED ORDER — BISACODYL 5 MG PO TBEC
5.0000 mg | DELAYED_RELEASE_TABLET | Freq: Once | ORAL | Status: AC
  Administered 2011-03-17: 5 mg via ORAL
  Filled 2011-03-16: qty 1

## 2011-03-16 MED ORDER — METOPROLOL TARTRATE 12.5 MG HALF TABLET
12.5000 mg | ORAL_TABLET | Freq: Once | ORAL | Status: AC
  Administered 2011-03-17: 12.5 mg via ORAL
  Filled 2011-03-16: qty 1

## 2011-03-16 MED ORDER — TEMAZEPAM 15 MG PO CAPS: 15.0000 mg | ORAL_CAPSULE | Freq: Once | ORAL | Status: AC | PRN

## 2011-03-16 NOTE — Progress Notes (Signed)
Procedure(s) (LRB): CORONARY ARTERY BYPASS GRAFTING (CABG) (N/A)                    [address]            [name] [address]          [phone]      Subjective: No angina working on and symptoms from upper Refuses arterial blood gas but PFT results acceptable CT scan of the chest shows no evidence recurrent cancer status post resection right lower lobe non-small cell cancer 2001  Objective: Vital signs in last 24 hours: Temp:  [98.2 F (36.8 C)-98.4 F (36.9 C)] 98.4 F (36.9 C) (02/06 1400) Pulse Rate:  [88-92] 89  (02/06 2016) Cardiac Rhythm:  [-] Normal sinus rhythm (02/06 2000) Resp:  [18-20] 19  (02/06 2016) BP: (109-125)/(79-80) 125/79 mmHg (02/06 1400) SpO2:  [94 %-99 %] 95 % (02/06 2016)  Hemodynamic parameters for last 24 hours:   normal sinus rhythm  Intake/Output from previous day: 02/05 0701 - 02/06 0700 In: 1440 [P.O.:1440] Out: 1350 [Urine:1350] Intake/Output this shift:    Exam Lungs clear cardiac rhythm regular No groin hematoma Neuro intact  Lab Results:  Basename 03/15/11 0530  WBC 8.0  HGB 15.3  HCT 44.9  PLT 168   BMET:  Basename 03/15/11 0530 03/14/11 1016  NA 136 --  K 3.8 --  CL 101 --  CO2 26 --  GLUCOSE 146* 134*  BUN 15 --  CREATININE 0.76 --  CALCIUM 9.2 --    PT/INR: No results found for this basename: LABPROT,INR in the last 72 hours ABG    Component Value Date/Time   PHART 7.356 04/19/2009 1140   HCO3 27.3* 04/19/2009 1140   TCO2 28.9 04/19/2009 1140   O2SAT 91.5 04/19/2009 1140   CBG (last 3)   Basename 03/15/11 1303  GLUCAP 184*    Assessment/Plan: S/P Procedure(s) (LRB): CORONARY ARTERY BYPASS GRAFTING (CABG) (N/A) Coronary bypass grafting planned February 8   07 30   LOS: 2 days    William Dyer,William Dyer 03/16/2011

## 2011-03-16 NOTE — Progress Notes (Signed)
Patient ID: William Dyer, male   DOB: 02/04/1946, 66 y.o.   MRN: [id number] @ Subjective:  Denies SSCP, palpitations or Dyspnea Patient indicates surgery may be tomorrow Smoking counselor has seen   Objective:  Filed Vitals:   03/15/11 2047 03/15/11 2103 03/16/11 0547 03/16/11 0850  BP: 137/78  109/80   Pulse: 89  92   Temp: 98.6 F (37 C)  98.2 F (36.8 C)   TempSrc: Oral  Oral   Resp: 19  20   Height:      Weight:      SpO2: 93% 93% 99% 98%    Intake/Output from previous day:  Intake/Output Summary (Last 24 hours) at 03/16/11 1137 Last data filed at 03/16/11 0900  Gross per 24 hour  Intake    960 ml  Output    950 ml  Net     10 ml    Physical Exam: Affect appropriate Healthy:  appears stated age HEENT: normal Neck supple with no adenopathy JVP normal no bruits no thyromegaly Lungs clear with no wheezing and good diaphragmatic motion Heart:  S1/S2 no murmur, no rub, gallop or click PMI normal Abdomen: benighn, BS positve, no tenderness, no AAA no bruit.  No HSM or HJR Distal pulses intact with no bruits No edema Neuro non-focal Skin warm and dry No muscular weakness   Lab Results: Basic Metabolic Panel:  Basename 03/15/11 0530 03/14/11 1016  NA 136 --  K 3.8 --  CL 101 --  CO2 26 --  GLUCOSE 146* 134*  BUN 15 --  CREATININE 0.76 --  CALCIUM 9.2 --  MG -- --  PHOS -- --   Liver Function Tests: No results found for this basename: AST:2,ALT:2,ALKPHOS:2,BILITOT:2,PROT:2,ALBUMIN:2 in the last 72 hours No results found for this basename: LIPASE:2,AMYLASE:2 in the last 72 hours CBC:  Basename 03/15/11 0530  WBC 8.0  NEUTROABS --  HGB 15.3  HCT 44.9  MCV 93.9  PLT 168   Fasting Lipid Panel:  Basename 03/14/11 1750  CHOL 286*  HDL 51  LDLCALC UNABLE TO CALCULATE IF TRIGLYCERIDE OVER 400 mg/dL  TRIG 409*  CHOLHDL 5.6  LDLDIRECT --    Imaging: Ct Angio Chest W/cm &/or Wo Cm  03/15/2011  *RADIOLOGY REPORT*  Clinical Data: Shortness of  breath.  Chest pain.  CT ANGIOGRAPHY CHEST  Technique:  Multidetector CT imaging of the chest using the standard protocol during bolus administration of intravenous contrast. Multiplanar reconstructed images including MIPs were obtained and reviewed to evaluate the vascular anatomy.  Contrast: OMNIPAQUE IOHEXOL 300 MG/ML IV SOLN  Comparison: Multiple exams, including 02/05/2010 and 04/18/2009  Findings: No filling defect is identified in the pulmonary arterial tree to suggest pulmonary embolus.  Coronary artery atherosclerosis noted.  A 2.1 x 3.4 cm density to the right of the lower thoracic esophagus on image 55 of series 4 is noted but has been present since 2004, and probably represents a bronchogenic cyst or similar benign finding.  Mild cardiomegaly noted.  Chronic borderline distal esophageal wall thickening noted.  A right lower paratracheal node has a short axis diameter of 0.8 cm on image 21 of series 4, reduced from prior measurement 1.4 cm. Centrilobular emphysema noted with a band of opacity in the left lower lobe along the wedge resection site which appears similar to the prior exam.  Interstitial accentuation is present along with subsegmental atelectasis in the left lower lobe.  Right posterior rib deformities may be from prior fractures or osteotomy.  IMPRESSION:  1.  No embolus observed. 2.  Improved paratracheal adenopathy. 3.  Severe emphysema. 4.  Coronary artery disease. 5.  Right lower lobe scarring along the wedge resection site. 6.  Chronic interstitial accentuation. 7.  Density to the right of the lower thoracic esophagus is stable from 2004 and accordingly probably represents a bronchogenic cyst or similar benign lesion.  Original Report Authenticated By: Dellia Cloud, M.D.    Cardiac Studies:  ECG:  NSR no acute changes   Telemetry:  NSR  Echo:   Medications:      . aspirin EC  81 mg Oral Daily  . bisacodyl  5 mg Oral Once  . chlorhexidine  60 mL Topical Once    . chlorhexidine  60 mL Topical Once  . chlorhexidine  60 mL Topical Once  . doxazosin  2 mg Oral QHS  . Fluticasone-Salmeterol  1 puff Inhalation Q12H  . levothyroxine  125 mcg Oral QAC breakfast  . loratadine  10 mg Oral Daily  . metoprolol tartrate  12.5 mg Oral Once  . nicotine  21 mg Transdermal Daily  . rosuvastatin  20 mg Oral q1800  . sodium chloride  3 mL Intravenous Q12H  . tiotropium  18 mcg Inhalation Daily       Assessment/Plan:  CAD:  Discussed with PVT surgery Thursday or Friday  Wants him to stay in hospital so he wont smoke Chol:  Diet consult start statin Smoking cessaton:  Consult done would use 21mg  patch  [name] 03/16/2011, 11:37 AM

## 2011-03-16 NOTE — Progress Notes (Signed)
Inpatient Diabetes Program Recommendations  AACE/ADA: New Consensus Statement on Inpatient Glycemic Control (2009)  Target Ranges:  Prepandial:   less than 140 mg/dL      Peak postprandial:   less than 180 mg/dL (1-2 hours)      Critically ill patients:  140 - 180 mg/dL   Please order CBGs TID achs since patient has a history of diabetes.    Thank you

## 2011-03-17 ENCOUNTER — Other Ambulatory Visit: Payer: Self-pay

## 2011-03-17 LAB — BASIC METABOLIC PANEL
BUN: 14 mg/dL (ref 6–23)
CO2: 31 mEq/L (ref 19–32)
Calcium: 9.8 mg/dL (ref 8.4–10.5)
Chloride: 99 mEq/L (ref 96–112)
Creatinine, Ser: 0.88 mg/dL (ref 0.50–1.35)
GFR calc Af Amer: >90 mL/min (ref >90)
GFR calc non Af Amer: 88 mL/min — ABNORMAL LOW (ref >90)
Glucose, Bld: 141 mg/dL — ABNORMAL HIGH (ref 70–99)
Potassium: 4.4 mEq/L (ref 3.5–5.1)
Sodium: 137 mEq/L (ref 135–145)

## 2011-03-17 LAB — CBC
HCT: 47.5 % (ref 39.0–52.0)
Hemoglobin: 16 g/dL (ref 13.0–17.0)
MCH: 31.8 pg (ref 26.0–34.0)
MCHC: 33.7 g/dL (ref 30.0–36.0)
MCV: 94.4 fL (ref 78.0–100.0)
Platelets: 157 10*3/uL (ref 150–400)
RBC: 5.03 MIL/uL (ref 4.22–5.81)
RDW: 14.8 % (ref 11.5–15.5)
WBC: 7.9 10*3/uL (ref 4.0–10.5)

## 2011-03-17 LAB — PREPARE RBC (CROSSMATCH)

## 2011-03-17 MED ORDER — POTASSIUM CHLORIDE 2 MEQ/ML IV SOLN
80.0000 meq | INTRAVENOUS | Status: DC
  Filled 2011-03-17: qty 40

## 2011-03-17 MED ORDER — SODIUM CHLORIDE 0.9 % IV SOLN
INTRAVENOUS | Status: AC
  Administered 2011-03-18: 2.7 [IU]/h via INTRAVENOUS
  Filled 2011-03-17: qty 1

## 2011-03-17 MED ORDER — DEXTROSE 5 % IV SOLN
1.5000 g | INTRAVENOUS | Status: AC
  Administered 2011-03-18: .75 g via INTRAVENOUS
  Administered 2011-03-18: 1.5 g via INTRAVENOUS
  Filled 2011-03-17: qty 1.5

## 2011-03-17 MED ORDER — DEXTROSE 5 % IV SOLN
750.0000 mg | INTRAVENOUS | Status: DC
  Filled 2011-03-17: qty 750

## 2011-03-17 MED ORDER — VANCOMYCIN HCL 1000 MG IV SOLR
1500.0000 mg | INTRAVENOUS | Status: AC
  Administered 2011-03-18: 1500 mg via INTRAVENOUS
  Filled 2011-03-17: qty 1500

## 2011-03-17 MED ORDER — PHENYLEPHRINE HCL 10 MG/ML IJ SOLN
30.0000 ug/min | INTRAVENOUS | Status: AC
  Administered 2011-03-18: 10 ug/min via INTRAVENOUS
  Filled 2011-03-17: qty 2

## 2011-03-17 MED ORDER — EPINEPHRINE HCL 1 MG/ML IJ SOLN
0.5000 ug/min | INTRAVENOUS | Status: DC
  Filled 2011-03-17: qty 4

## 2011-03-17 MED ORDER — NITROGLYCERIN IN D5W 200-5 MCG/ML-% IV SOLN
2.0000 ug/min | INTRAVENOUS | Status: AC
  Administered 2011-03-18: 5 ug/min via INTRAVENOUS
  Filled 2011-03-17: qty 250

## 2011-03-17 MED ORDER — DOPAMINE-DEXTROSE 3.2-5 MG/ML-% IV SOLN
2.0000 ug/kg/min | INTRAVENOUS | Status: AC
  Administered 2011-03-18: 3 ug/kg/min via INTRAVENOUS
  Filled 2011-03-17: qty 250

## 2011-03-17 MED ORDER — VERAPAMIL HCL 2.5 MG/ML IV SOLN
INTRAVENOUS | Status: AC
  Administered 2011-03-18: 09:00:00
  Filled 2011-03-17: qty 2.5

## 2011-03-17 MED ORDER — SODIUM CHLORIDE 0.9 % IV SOLN
INTRAVENOUS | Status: AC
  Administered 2011-03-18: 70 mL/h via INTRAVENOUS
  Filled 2011-03-17: qty 40

## 2011-03-17 MED ORDER — MAGNESIUM SULFATE 50 % IJ SOLN
40.0000 meq | INTRAMUSCULAR | Status: DC
  Filled 2011-03-17: qty 10

## 2011-03-17 MED ORDER — SODIUM CHLORIDE 0.9 % IV SOLN
0.1000 ug/kg/h | INTRAVENOUS | Status: AC
  Administered 2011-03-18: .2 ug/kg/h via INTRAVENOUS
  Filled 2011-03-17: qty 4

## 2011-03-17 NOTE — Progress Notes (Signed)
Pt shown video 112 Preparing for heart surgery and 113 recovering from heart surgery. Cardiac Surgery book given and reviewed with pt.

## 2011-03-17 NOTE — Progress Notes (Signed)
Patient ID: William Dyer, male   DOB: Dec 30, 1945, 66 y.o.   MRN: [id number] @ Subjective:  Denies SSCP, palpitations or Dyspnea Patient indicates surgery  First case tomorrow   Objective:  Filed Vitals:   03/16/11 2016 03/16/11 2204 03/17/11 0619 03/17/11 0700  BP:  128/70 118/75   Pulse: 89 89 87 90  Temp:  97.8 F (36.6 C) 97.6 F (36.4 C)   TempSrc:  Oral Oral   Resp: 19 19 19 20   Height:      Weight:    88.4 kg (194 lb 14.2 oz)  SpO2: 95% 95% 94% 93%    Intake/Output from previous day:  Intake/Output Summary (Last 24 hours) at 03/17/11 1044 Last data filed at 03/17/11 4696  Gross per 24 hour  Intake    783 ml  Output   1075 ml  Net   -292 ml    Physical Exam: Affect appropriate Healthy:  appears stated age HEENT: normal Neck supple with no adenopathy JVP normal no bruits no thyromegaly Lungs clear with no wheezing and good diaphragmatic motion Heart:  S1/S2 no murmur, no rub, gallop or click PMI normal Abdomen: benighn, BS positve, no tenderness, no AAA no bruit.  No HSM or HJR Distal pulses intact with no bruits No edema Neuro non-focal Skin warm and dry No muscular weakness   Lab Results: Basic Metabolic Panel:  Basename 03/17/11 0700 03/15/11 0530  NA 137 136  K 4.4 3.8  CL 99 101  CO2 31 26  GLUCOSE 141* 146*  BUN 14 15  CREATININE 0.88 0.76  CALCIUM 9.8 9.2  MG -- --  PHOS -- --   Liver Function Tests: No results found for this basename: AST:2,ALT:2,ALKPHOS:2,BILITOT:2,PROT:2,ALBUMIN:2 in the last 72 hours No results found for this basename: LIPASE:2,AMYLASE:2 in the last 72 hours CBC:  Basename 03/17/11 0700 03/16/11 2210  WBC 7.9 8.3  NEUTROABS -- --  HGB 16.0 15.5  HCT 47.5 44.9  MCV 94.4 93.3  PLT 157 161   Fasting Lipid Panel:  Basename 03/14/11 1750  CHOL 286*  HDL 51  LDLCALC UNABLE TO CALCULATE IF TRIGLYCERIDE OVER 400 mg/dL  TRIG 295*  CHOLHDL 5.6  LDLDIRECT --    Imaging: Ct Angio Chest W/cm &/or Wo  Cm  03/15/2011  *RADIOLOGY REPORT*  Clinical Data: Shortness of breath.  Chest pain.  CT ANGIOGRAPHY CHEST  Technique:  Multidetector CT imaging of the chest using the standard protocol during bolus administration of intravenous contrast. Multiplanar reconstructed images including MIPs were obtained and reviewed to evaluate the vascular anatomy.  Contrast: OMNIPAQUE IOHEXOL 300 MG/ML IV SOLN  Comparison: Multiple exams, including 02/05/2010 and 04/18/2009  Findings: No filling defect is identified in the pulmonary arterial tree to suggest pulmonary embolus.  Coronary artery atherosclerosis noted.  A 2.1 x 3.4 cm density to the right of the lower thoracic esophagus on image 55 of series 4 is noted but has been present since 2004, and probably represents a bronchogenic cyst or similar benign finding.  Mild cardiomegaly noted.  Chronic borderline distal esophageal wall thickening noted.  A right lower paratracheal node has a short axis diameter of 0.8 cm on image 21 of series 4, reduced from prior measurement 1.4 cm. Centrilobular emphysema noted with a band of opacity in the left lower lobe along the wedge resection site which appears similar to the prior exam.  Interstitial accentuation is present along with subsegmental atelectasis in the left lower lobe.  Right posterior rib deformities may  be from prior fractures or osteotomy.  IMPRESSION:  1.  No embolus observed. 2.  Improved paratracheal adenopathy. 3.  Severe emphysema. 4.  Coronary artery disease. 5.  Right lower lobe scarring along the wedge resection site. 6.  Chronic interstitial accentuation. 7.  Density to the right of the lower thoracic esophagus is stable from 2004 and accordingly probably represents a bronchogenic cyst or similar benign lesion.  Original Report Authenticated By: William Dyer, M.D.    Cardiac Studies:  ECG:  NSR no acute changes   Telemetry:  NSR  Medications:      . aspirin EC  81 mg Oral Daily  . bisacodyl   5 mg Oral Once  . chlorhexidine  60 mL Topical Once  . chlorhexidine  60 mL Topical Once  . chlorhexidine  60 mL Topical Once  . doxazosin  2 mg Oral QHS  . Fluticasone-Salmeterol  1 puff Inhalation Q12H  . levothyroxine  125 mcg Oral QAC breakfast  . loratadine  10 mg Oral Daily  . metoprolol tartrate  12.5 mg Oral Once  . nicotine  21 mg Transdermal Daily  . rosuvastatin  20 mg Oral q1800  . sodium chloride  3 mL Intravenous Q12H  . tiotropium  18 mcg Inhalation Daily       Assessment/Plan:  CAD:  CABG first case Friday  Wants him to stay in hospital so he wont smoke Chol:  Diet consult start statin Smoking cessaton:  Consult done would use 21mg  patch  William Dyer 03/17/2011, 10:44 AM

## 2011-03-17 NOTE — Progress Notes (Signed)
Severe 2 vessel CAD Plan CABG in AM

## 2011-03-18 ENCOUNTER — Other Ambulatory Visit: Payer: Self-pay

## 2011-03-18 ENCOUNTER — Encounter (HOSPITAL_COMMUNITY)
Admission: AD | Disposition: A | Discharge status: Home Health | Payer: Self-pay | Source: Ambulatory Visit | Attending: Cardiothoracic Surgery

## 2011-03-18 ENCOUNTER — Encounter (HOSPITAL_COMMUNITY): Payer: Self-pay | Admitting: Certified Registered"

## 2011-03-18 ENCOUNTER — Inpatient Hospital Stay (HOSPITAL_COMMUNITY): Payer: Medicare Other

## 2011-03-18 ENCOUNTER — Inpatient Hospital Stay (HOSPITAL_COMMUNITY): Payer: Medicare Other | Admitting: Certified Registered"

## 2011-03-18 DIAGNOSIS — I2510 Atherosclerotic heart disease of native coronary artery without angina pectoris: Secondary | ICD-10-CM

## 2011-03-18 HISTORY — PX: CORONARY ARTERY BYPASS GRAFT: SHX141

## 2011-03-18 LAB — POCT I-STAT 3, ART BLOOD GAS (G3+)
Acid-base deficit: 3 mmol/L — ABNORMAL HIGH (ref 0.0–2.0)
Acid-base deficit: 2 mmol/L (ref 0.0–2.0)
Bicarbonate: 29.7 mEq/L — ABNORMAL HIGH (ref 20.0–24.0)
Bicarbonate: 25.1 mEq/L — ABNORMAL HIGH (ref 20.0–24.0)
Bicarbonate: 26 mEq/L — ABNORMAL HIGH (ref 20.0–24.0)
Bicarbonate: 24 mEq/L (ref 20.0–24.0)
Bicarbonate: 24.4 mEq/L — ABNORMAL HIGH (ref 20.0–24.0)
O2 Saturation: 98 %
Patient temperature: 36.6
Patient temperature: 37
Patient temperature: 99.8
TCO2: 27 mmol/L (ref 0–100)
TCO2: 27 mmol/L (ref 0–100)
pCO2 arterial: 51.2 mmHg — ABNORMAL HIGH (ref 35.0–45.0)
pCO2 arterial: 48.2 mmHg — ABNORMAL HIGH (ref 35.0–45.0)
pCO2 arterial: 49.9 mmHg — ABNORMAL HIGH (ref 35.0–45.0)
pH, Arterial: 7.372 (ref 7.350–7.450)
pH, Arterial: 7.324 — ABNORMAL LOW (ref 7.350–7.450)
pH, Arterial: 7.227 — ABNORMAL LOW (ref 7.350–7.450)
pH, Arterial: 7.321 — ABNORMAL LOW (ref 7.350–7.450)
pO2, Arterial: 116 mmHg — ABNORMAL HIGH (ref 80.0–100.0)

## 2011-03-18 LAB — PROTIME-INR
INR: 1.22 (ref 0.00–1.49)
INR: 1.1 (ref 0.00–1.49)
Prothrombin Time: 15.7 seconds — ABNORMAL HIGH (ref 11.6–15.2)
Prothrombin Time: 14.4 seconds (ref 11.6–15.2)

## 2011-03-18 LAB — POCT I-STAT 4, (NA,K, GLUC, HGB,HCT)
Glucose, Bld: 107 mg/dL — ABNORMAL HIGH (ref 70–99)
Glucose, Bld: 109 mg/dL — ABNORMAL HIGH (ref 70–99)
HCT: 34 % — ABNORMAL LOW (ref 39.0–52.0)
HCT: 34 % — ABNORMAL LOW (ref 39.0–52.0)
Hemoglobin: 15.3 g/dL (ref 13.0–17.0)
Hemoglobin: 11.6 g/dL — ABNORMAL LOW (ref 13.0–17.0)
Hemoglobin: 11.6 g/dL — ABNORMAL LOW (ref 13.0–17.0)
Hemoglobin: 12.9 g/dL — ABNORMAL LOW (ref 13.0–17.0)
Potassium: 4.7 mEq/L (ref 3.5–5.1)
Potassium: 4 mEq/L (ref 3.5–5.1)
Sodium: 138 mEq/L (ref 135–145)
Sodium: 138 mEq/L (ref 135–145)
Sodium: 141 mEq/L (ref 135–145)

## 2011-03-18 LAB — GLUCOSE, CAPILLARY
Glucose-Capillary: 90 mg/dL (ref 70–99)
Glucose-Capillary: 106 mg/dL — ABNORMAL HIGH (ref 70–99)
Glucose-Capillary: 108 mg/dL — ABNORMAL HIGH (ref 70–99)
Glucose-Capillary: 94 mg/dL (ref 70–99)
Glucose-Capillary: 116 mg/dL — ABNORMAL HIGH (ref 70–99)

## 2011-03-18 LAB — CREATININE, SERUM
Creatinine, Ser: 0.68 mg/dL (ref 0.50–1.35)
GFR calc Af Amer: >90 mL/min (ref >90)
GFR calc non Af Amer: >90 mL/min (ref >90)

## 2011-03-18 LAB — POCT I-STAT, CHEM 8
Calcium, Ion: 1.12 mmol/L (ref 1.12–1.32)
Creatinine, Ser: 0.8 mg/dL (ref 0.50–1.35)
Glucose, Bld: 104 mg/dL — ABNORMAL HIGH (ref 70–99)
Hemoglobin: 13.6 g/dL (ref 13.0–17.0)
TCO2: 25 mmol/L (ref 0–100)

## 2011-03-18 LAB — CBC
HCT: 38 % — ABNORMAL LOW (ref 39.0–52.0)
Hemoglobin: 12.9 g/dL — ABNORMAL LOW (ref 13.0–17.0)
Hemoglobin: 13 g/dL (ref 13.0–17.0)
MCH: 31.6 pg (ref 26.0–34.0)
MCHC: 33.1 g/dL (ref 30.0–36.0)
MCHC: 34.2 g/dL (ref 30.0–36.0)
MCV: 92.5 fL (ref 78.0–100.0)
Platelets: 132 10*3/uL — ABNORMAL LOW (ref 150–400)
RBC: 4.16 MIL/uL — ABNORMAL LOW (ref 4.22–5.81)
RBC: 4.11 MIL/uL — ABNORMAL LOW (ref 4.22–5.81)
RDW: 14.4 % (ref 11.5–15.5)
WBC: 16.8 10*3/uL — ABNORMAL HIGH (ref 4.0–10.5)
WBC: 16.9 10*3/uL — ABNORMAL HIGH (ref 4.0–10.5)

## 2011-03-18 LAB — PLATELET COUNT: Platelets: 111 10*3/uL — ABNORMAL LOW (ref 150–400)

## 2011-03-18 LAB — APTT
aPTT: 33 seconds (ref 24–37)
aPTT: 30 seconds (ref 24–37)

## 2011-03-18 LAB — HEMOGLOBIN AND HEMATOCRIT, BLOOD
HCT: 32.5 % — ABNORMAL LOW (ref 39.0–52.0)
Hemoglobin: 11.1 g/dL — ABNORMAL LOW (ref 13.0–17.0)

## 2011-03-18 LAB — POCT I-STAT GLUCOSE: Operator id: 190282

## 2011-03-18 LAB — MAGNESIUM: Magnesium: 3 mg/dL — ABNORMAL HIGH (ref 1.5–2.5)

## 2011-03-18 SURGERY — CORONARY ARTERY BYPASS GRAFTING (CABG)
Anesthesia: General | Site: Chest | Wound class: Clean

## 2011-03-18 MED ORDER — SODIUM CHLORIDE 0.9 % IJ SOLN
OROMUCOSAL | Status: DC | PRN
  Administered 2011-03-18 (×3): via TOPICAL

## 2011-03-18 MED ORDER — BISACODYL 5 MG PO TBEC
10.0000 mg | DELAYED_RELEASE_TABLET | Freq: Every day | ORAL | Status: DC
  Administered 2011-03-19 – 2011-03-21 (×3): 10 mg via ORAL
  Filled 2011-03-18 (×3): qty 2

## 2011-03-18 MED ORDER — INSULIN ASPART 100 UNIT/ML ~~LOC~~ SOLN: 0.0000 [IU] | SUBCUTANEOUS | Status: DC

## 2011-03-18 MED ORDER — MORPHINE SULFATE 2 MG/ML IJ SOLN
1.0000 mg | INTRAMUSCULAR | Status: AC | PRN
  Administered 2011-03-18: 2 mg via INTRAVENOUS
  Filled 2011-03-18: qty 1

## 2011-03-18 MED ORDER — DOCUSATE SODIUM 100 MG PO CAPS
200.0000 mg | ORAL_CAPSULE | Freq: Every day | ORAL | Status: DC
  Administered 2011-03-19 – 2011-03-21 (×3): 200 mg via ORAL
  Filled 2011-03-18 (×3): qty 2

## 2011-03-18 MED ORDER — ASPIRIN EC 325 MG PO TBEC
325.0000 mg | DELAYED_RELEASE_TABLET | Freq: Every day | ORAL | Status: DC
  Administered 2011-03-19 – 2011-03-21 (×3): 325 mg via ORAL
  Filled 2011-03-18 (×3): qty 1

## 2011-03-18 MED ORDER — MIDAZOLAM HCL 5 MG/5ML IJ SOLN
INTRAMUSCULAR | Status: DC | PRN
  Administered 2011-03-18 (×4): 2 mg via INTRAVENOUS

## 2011-03-18 MED ORDER — FAMOTIDINE IN NACL 20-0.9 MG/50ML-% IV SOLN
20.0000 mg | Freq: Two times a day (BID) | INTRAVENOUS | Status: AC
  Administered 2011-03-18 (×2): 20 mg via INTRAVENOUS
  Filled 2011-03-18: qty 50

## 2011-03-18 MED ORDER — VECURONIUM BROMIDE 10 MG IV SOLR
INTRAVENOUS | Status: DC | PRN
  Administered 2011-03-18 (×2): 5 mg via INTRAVENOUS
  Administered 2011-03-18: 2 mg via INTRAVENOUS
  Administered 2011-03-18 (×2): 5 mg via INTRAVENOUS
  Administered 2011-03-18: 10 mg via INTRAVENOUS
  Administered 2011-03-18: 4 mg via INTRAVENOUS

## 2011-03-18 MED ORDER — PROTAMINE SULFATE 10 MG/ML IV SOLN
INTRAVENOUS | Status: DC | PRN
  Administered 2011-03-18: 150 mg via INTRAVENOUS
  Administered 2011-03-18: 30 mg via INTRAVENOUS
  Administered 2011-03-18: 20 mg via INTRAVENOUS

## 2011-03-18 MED ORDER — ACETAMINOPHEN 500 MG PO TABS
1000.0000 mg | ORAL_TABLET | Freq: Four times a day (QID) | ORAL | Status: AC
  Administered 2011-03-19 – 2011-03-23 (×16): 1000 mg via ORAL
  Filled 2011-03-18 (×17): qty 2

## 2011-03-18 MED ORDER — OXYCODONE HCL 5 MG PO TABS
5.0000 mg | ORAL_TABLET | ORAL | Status: DC | PRN
  Administered 2011-03-18 – 2011-03-19 (×2): 10 mg via ORAL
  Administered 2011-03-19: 5 mg via ORAL
  Administered 2011-03-19: 10 mg via ORAL
  Administered 2011-03-19: 5 mg via ORAL
  Filled 2011-03-18: qty 1
  Filled 2011-03-18: qty 2
  Filled 2011-03-18: qty 1
  Filled 2011-03-18 (×2): qty 2

## 2011-03-18 MED ORDER — DOPAMINE-DEXTROSE 3.2-5 MG/ML-% IV SOLN: 0.0000 ug/kg/min | INTRAVENOUS | Status: DC

## 2011-03-18 MED ORDER — LACTATED RINGERS IV SOLN
INTRAVENOUS | Status: DC | PRN
  Administered 2011-03-18 (×2): via INTRAVENOUS

## 2011-03-18 MED ORDER — INSULIN REGULAR BOLUS VIA INFUSION
0.0000 [IU] | Freq: Three times a day (TID) | INTRAVENOUS | Status: DC
  Administered 2011-03-18: 0 [IU] via INTRAVENOUS
  Filled 2011-03-18: qty 10

## 2011-03-18 MED ORDER — SODIUM CHLORIDE 0.9 % IV SOLN
0.1000 ug/kg/h | INTRAVENOUS | Status: DC
  Administered 2011-03-18: 0.5 ug/kg/h via INTRAVENOUS
  Administered 2011-03-18: 0.7 ug/kg/h via INTRAVENOUS
  Filled 2011-03-18 (×4): qty 2

## 2011-03-18 MED ORDER — MORPHINE SULFATE 4 MG/ML IJ SOLN
2.0000 mg | INTRAMUSCULAR | Status: DC | PRN
  Administered 2011-03-18 – 2011-03-19 (×5): 4 mg via INTRAVENOUS
  Filled 2011-03-18 (×5): qty 1

## 2011-03-18 MED ORDER — LEVALBUTEROL TARTRATE 45 MCG/ACT IN AERO
6.0000 | INHALATION_SPRAY | Freq: Four times a day (QID) | RESPIRATORY_TRACT | Status: DC
  Administered 2011-03-18 – 2011-03-21 (×9): 6 via RESPIRATORY_TRACT

## 2011-03-18 MED ORDER — ACETAMINOPHEN 160 MG/5ML PO SOLN
975.0000 mg | Freq: Four times a day (QID) | ORAL | Status: AC
  Administered 2011-03-18: 975 mg
  Filled 2011-03-18: qty 20.3
  Filled 2011-03-18: qty 40.6

## 2011-03-18 MED ORDER — METOPROLOL TARTRATE 12.5 MG HALF TABLET
12.5000 mg | ORAL_TABLET | Freq: Two times a day (BID) | ORAL | Status: DC
  Administered 2011-03-20 – 2011-03-21 (×3): 12.5 mg via ORAL
  Filled 2011-03-18 (×7): qty 1

## 2011-03-18 MED ORDER — LEVALBUTEROL TARTRATE 45 MCG/ACT IN AERO
6.0000 | INHALATION_SPRAY | Freq: Four times a day (QID) | RESPIRATORY_TRACT | Status: DC
  Filled 2011-03-18: qty 15

## 2011-03-18 MED ORDER — HEMOSTATIC AGENTS (NO CHARGE) OPTIME
TOPICAL | Status: DC | PRN
  Administered 2011-03-18: 1 via TOPICAL

## 2011-03-18 MED ORDER — ACETAMINOPHEN 650 MG RE SUPP
650.0000 mg | RECTAL | Status: AC
  Administered 2011-03-18: 650 mg via RECTAL

## 2011-03-18 MED ORDER — LEVALBUTEROL TARTRATE 45 MCG/ACT IN AERO: 6.0000 | INHALATION_SPRAY | Freq: Four times a day (QID) | RESPIRATORY_TRACT | Status: DC

## 2011-03-18 MED ORDER — SODIUM CHLORIDE 0.9 % IV SOLN
INTRAVENOUS | Status: DC
  Filled 2011-03-18: qty 1

## 2011-03-18 MED ORDER — METOPROLOL TARTRATE 25 MG/10 ML ORAL SUSPENSION
12.5000 mg | Freq: Two times a day (BID) | ORAL | Status: DC
  Filled 2011-03-18 (×7): qty 5

## 2011-03-18 MED ORDER — MIDAZOLAM HCL 2 MG/2ML IJ SOLN: 2.0000 mg | INTRAMUSCULAR | Status: DC | PRN

## 2011-03-18 MED ORDER — ACETAMINOPHEN 160 MG/5ML PO SOLN: 650.0000 mg | ORAL | Status: AC

## 2011-03-18 MED ORDER — PANTOPRAZOLE SODIUM 40 MG PO TBEC
40.0000 mg | DELAYED_RELEASE_TABLET | Freq: Every day | ORAL | Status: DC
  Administered 2011-03-20 – 2011-03-21 (×2): 40 mg via ORAL
  Filled 2011-03-18 (×2): qty 1

## 2011-03-18 MED ORDER — PHENYLEPHRINE HCL 10 MG/ML IJ SOLN
0.0000 ug/min | INTRAVENOUS | Status: DC
  Filled 2011-03-18 (×3): qty 2

## 2011-03-18 MED ORDER — HEPARIN SODIUM (PORCINE) 1000 UNIT/ML IJ SOLN
INTRAMUSCULAR | Status: DC | PRN
  Administered 2011-03-18: 18000 [IU] via INTRAVENOUS
  Administered 2011-03-18: 5000 [IU] via INTRAVENOUS
  Administered 2011-03-18: 2000 [IU] via INTRAVENOUS

## 2011-03-18 MED ORDER — LACTATED RINGERS IV SOLN: INTRAVENOUS | Status: DC

## 2011-03-18 MED ORDER — NITROGLYCERIN IN D5W 200-5 MCG/ML-% IV SOLN: 0.0000 ug/min | INTRAVENOUS | Status: DC

## 2011-03-18 MED ORDER — INSULIN ASPART 100 UNIT/ML ~~LOC~~ SOLN
0.0000 [IU] | SUBCUTANEOUS | Status: AC
  Administered 2011-03-19: 2 [IU] via SUBCUTANEOUS
  Filled 2011-03-18: qty 3

## 2011-03-18 MED ORDER — FENTANYL CITRATE 0.05 MG/ML IJ SOLN
INTRAMUSCULAR | Status: DC | PRN
  Administered 2011-03-18: 350 ug via INTRAVENOUS
  Administered 2011-03-18 (×2): 100 ug via INTRAVENOUS
  Administered 2011-03-18: 150 ug via INTRAVENOUS
  Administered 2011-03-18: 100 ug via INTRAVENOUS
  Administered 2011-03-18: 250 ug via INTRAVENOUS
  Administered 2011-03-18: 100 ug via INTRAVENOUS
  Administered 2011-03-18: 200 ug via INTRAVENOUS
  Administered 2011-03-18: 100 ug via INTRAVENOUS

## 2011-03-18 MED ORDER — 0.9 % SODIUM CHLORIDE (POUR BTL) OPTIME
TOPICAL | Status: DC | PRN
  Administered 2011-03-18: 6000 mL

## 2011-03-18 MED ORDER — ALBUMIN HUMAN 5 % IV SOLN
250.0000 mL | INTRAVENOUS | Status: AC | PRN
  Administered 2011-03-18: 250 mL via INTRAVENOUS

## 2011-03-18 MED ORDER — ONDANSETRON HCL 4 MG/2ML IJ SOLN
4.0000 mg | Freq: Four times a day (QID) | INTRAMUSCULAR | Status: DC | PRN
  Administered 2011-03-19 – 2011-03-20 (×3): 4 mg via INTRAVENOUS
  Filled 2011-03-18 (×3): qty 2

## 2011-03-18 MED ORDER — PROPOFOL 10 MG/ML IV EMUL
INTRAVENOUS | Status: DC | PRN
  Administered 2011-03-18: 100 mg via INTRAVENOUS

## 2011-03-18 MED ORDER — BISACODYL 10 MG RE SUPP: 10.0000 mg | Freq: Every day | RECTAL | Status: DC

## 2011-03-18 MED ORDER — POTASSIUM CHLORIDE 10 MEQ/50ML IV SOLN: 10.0000 meq | INTRAVENOUS | Status: AC

## 2011-03-18 MED ORDER — SODIUM CHLORIDE 0.9 % IJ SOLN
3.0000 mL | Freq: Two times a day (BID) | INTRAMUSCULAR | Status: DC
  Administered 2011-03-19 – 2011-03-21 (×5): 3 mL via INTRAVENOUS

## 2011-03-18 MED ORDER — LACTATED RINGERS IV SOLN: 500.0000 mL | Freq: Once | INTRAVENOUS | Status: AC | PRN

## 2011-03-18 MED ORDER — LEVALBUTEROL HCL 1.25 MG/0.5ML IN NEBU
1.2500 mg | INHALATION_SOLUTION | Freq: Three times a day (TID) | RESPIRATORY_TRACT | Status: DC
  Filled 2011-03-18 (×3): qty 0.5

## 2011-03-18 MED ORDER — DEXTROSE 5 % IV SOLN
1.5000 g | Freq: Two times a day (BID) | INTRAVENOUS | Status: AC
  Administered 2011-03-18 – 2011-03-20 (×4): 1.5 g via INTRAVENOUS
  Filled 2011-03-18 (×4): qty 1.5

## 2011-03-18 MED ORDER — SODIUM CHLORIDE 0.9 % IJ SOLN: 3.0000 mL | INTRAMUSCULAR | Status: DC | PRN

## 2011-03-18 MED ORDER — ALBUMIN HUMAN 5 % IV SOLN
INTRAVENOUS | Status: DC | PRN
  Administered 2011-03-18: 13:00:00 via INTRAVENOUS

## 2011-03-18 MED ORDER — MAGNESIUM SULFATE 40 MG/ML IJ SOLN
4.0000 g | Freq: Once | INTRAMUSCULAR | Status: AC
  Administered 2011-03-18: 4 g via INTRAVENOUS
  Filled 2011-03-18: qty 100

## 2011-03-18 MED ORDER — SODIUM CHLORIDE 0.45 % IV SOLN
INTRAVENOUS | Status: DC
  Administered 2011-03-18 – 2011-03-20 (×3): via INTRAVENOUS

## 2011-03-18 MED ORDER — SODIUM CHLORIDE 0.9 % IV SOLN: INTRAVENOUS | Status: DC

## 2011-03-18 MED ORDER — VANCOMYCIN HCL 1000 MG IV SOLR
1000.0000 mg | Freq: Once | INTRAVENOUS | Status: AC
  Administered 2011-03-18: 1000 mg via INTRAVENOUS
  Filled 2011-03-18: qty 1000

## 2011-03-18 MED ORDER — METOPROLOL TARTRATE 1 MG/ML IV SOLN: 2.5000 mg | INTRAVENOUS | Status: DC | PRN

## 2011-03-18 MED ORDER — SODIUM CHLORIDE 0.9 % IV SOLN: 250.0000 mL | INTRAVENOUS | Status: DC

## 2011-03-18 MED ORDER — ASPIRIN 81 MG PO CHEW: 324.0000 mg | CHEWABLE_TABLET | Freq: Every day | ORAL | Status: DC

## 2011-03-18 SURGICAL SUPPLY — 133 items
ADAPTER CARDIO PERF ANTE/RETRO (ADAPTER) ×3 IMPLANT
ADH SKN CLS APL DERMABOND .7 (GAUZE/BANDAGES/DRESSINGS) ×1
ADPR PRFSN 84XANTGRD RTRGD (ADAPTER) ×1
APPLIER CLIP 9.375 MED OPEN (MISCELLANEOUS)
APPLIER CLIP 9.375 SM OPEN (CLIP)
APR CLP MED 9.3 20 MLT OPN (MISCELLANEOUS)
APR CLP SM 9.3 20 MLT OPN (CLIP)
ATTRACTOMAT 16X20 MAGNETIC DRP (DRAPES) ×3 IMPLANT
BAG DECANTER FOR FLEXI CONT (MISCELLANEOUS) ×3 IMPLANT
BANDAGE ELASTIC 4 VELCRO ST LF (GAUZE/BANDAGES/DRESSINGS) ×3 IMPLANT
BANDAGE ELASTIC 6 VELCRO ST LF (GAUZE/BANDAGES/DRESSINGS) ×3 IMPLANT
BANDAGE GAUZE ELAST BULKY 4 IN (GAUZE/BANDAGES/DRESSINGS) ×3 IMPLANT
BASKET HEART  (ORDER IN 25'S) (MISCELLANEOUS) ×1
BASKET HEART (ORDER IN 25'S) (MISCELLANEOUS) ×1
BASKET HEART (ORDER IN 25S) (MISCELLANEOUS) ×1 IMPLANT
BLADE SAW STERNAL (BLADE) ×3 IMPLANT
BLADE SURG 11 STRL SS (BLADE) ×2 IMPLANT
BLADE SURG 12 STRL SS (BLADE) ×3 IMPLANT
BLADE SURG ROTATE 9660 (MISCELLANEOUS) IMPLANT
CANISTER SUCTION 2500CC (MISCELLANEOUS) ×3 IMPLANT
CANNULA GUNDRY RCSP 15FR (MISCELLANEOUS) ×3 IMPLANT
CATH CPB KIT VANTRIGT (MISCELLANEOUS) ×3 IMPLANT
CATH ROBINSON RED A/P 18FR (CATHETERS) ×9 IMPLANT
CATH THORACIC 28FR (CATHETERS) IMPLANT
CATH THORACIC 28FR RT ANG (CATHETERS) IMPLANT
CATH THORACIC 36FR (CATHETERS) IMPLANT
CATH THORACIC 36FR RT ANG (CATHETERS) ×4 IMPLANT
CLIP APPLIE 9.375 MED OPEN (MISCELLANEOUS) IMPLANT
CLIP APPLIE 9.375 SM OPEN (CLIP) IMPLANT
CLIP FOGARTY SPRING 6M (CLIP) IMPLANT
CLIP TI MEDIUM 24 (CLIP) IMPLANT
CLIP TI WIDE RED SMALL 24 (CLIP) ×4 IMPLANT
CLOTH BEACON ORANGE TIMEOUT ST (SAFETY) ×3 IMPLANT
CONN Y 3/8X3/8X3/8  BEN (MISCELLANEOUS)
CONN Y 3/8X3/8X3/8 BEN (MISCELLANEOUS) IMPLANT
COVER SURGICAL LIGHT HANDLE (MISCELLANEOUS) ×6 IMPLANT
CRADLE DONUT ADULT HEAD (MISCELLANEOUS) ×3 IMPLANT
DERMABOND ADVANCED (GAUZE/BANDAGES/DRESSINGS) ×2
DERMABOND ADVANCED .7 DNX12 (GAUZE/BANDAGES/DRESSINGS) IMPLANT
DRAIN CHANNEL 32F RND 10.7 FF (WOUND CARE) ×3 IMPLANT
DRAPE CARDIOVASCULAR INCISE (DRAPES) ×3
DRAPE SLUSH MACHINE 52X66 (DRAPES) IMPLANT
DRAPE SLUSH/WARMER DISC (DRAPES) IMPLANT
DRAPE SRG 135X102X78XABS (DRAPES) ×1 IMPLANT
DRSG COVADERM 4X14 (GAUZE/BANDAGES/DRESSINGS) ×3 IMPLANT
ELECT BLADE 4.0 EZ CLEAN MEGAD (MISCELLANEOUS) ×3
ELECT BLADE 6.5 EXT (BLADE) ×3 IMPLANT
ELECT CAUTERY BLADE 6.4 (BLADE) ×3 IMPLANT
ELECT REM PT RETURN 9FT ADLT (ELECTROSURGICAL) ×6
ELECTRODE BLDE 4.0 EZ CLN MEGD (MISCELLANEOUS) ×1 IMPLANT
ELECTRODE REM PT RTRN 9FT ADLT (ELECTROSURGICAL) ×2 IMPLANT
GLOVE BIO SURGEON STRL SZ 6 (GLOVE) IMPLANT
GLOVE BIO SURGEON STRL SZ 6.5 (GLOVE) IMPLANT
GLOVE BIO SURGEON STRL SZ7 (GLOVE) IMPLANT
GLOVE BIO SURGEON STRL SZ7.5 (GLOVE) ×6 IMPLANT
GLOVE BIO SURGEONS STRL SZ 6.5 (GLOVE)
GLOVE BIOGEL PI IND STRL 6 (GLOVE) IMPLANT
GLOVE BIOGEL PI IND STRL 6.5 (GLOVE) IMPLANT
GLOVE BIOGEL PI IND STRL 7.0 (GLOVE) IMPLANT
GLOVE BIOGEL PI INDICATOR 6 (GLOVE) ×10
GLOVE BIOGEL PI INDICATOR 6.5 (GLOVE) ×10
GLOVE BIOGEL PI INDICATOR 7.0 (GLOVE) ×4
GLOVE EUDERMIC 7 POWDERFREE (GLOVE) IMPLANT
GLOVE ORTHO TXT STRL SZ7.5 (GLOVE) IMPLANT
GOWN PREVENTION PLUS XLARGE (GOWN DISPOSABLE) ×4 IMPLANT
GOWN STRL NON-REIN LRG LVL3 (GOWN DISPOSABLE) ×14 IMPLANT
HEMOSTAT POWDER SURGIFOAM 1G (HEMOSTASIS) ×9 IMPLANT
HEMOSTAT SURGICEL 2X14 (HEMOSTASIS) ×3 IMPLANT
INSERT FOGARTY 61MM (MISCELLANEOUS) IMPLANT
INSERT FOGARTY XLG (MISCELLANEOUS) IMPLANT
KIT BASIN OR (CUSTOM PROCEDURE TRAY) ×3 IMPLANT
KIT ROOM TURNOVER OR (KITS) ×3 IMPLANT
KIT SUCTION CATH 14FR (SUCTIONS) ×3 IMPLANT
KIT VASOVIEW W/TROCAR VH 2000 (KITS) ×3 IMPLANT
LEAD PACING MYOCARDI (MISCELLANEOUS) ×3 IMPLANT
MARKER GRAFT CORONARY BYPASS (MISCELLANEOUS) ×9 IMPLANT
NS IRRIG 1000ML POUR BTL (IV SOLUTION) ×15 IMPLANT
PACK OPEN HEART (CUSTOM PROCEDURE TRAY) ×3 IMPLANT
PAD ARMBOARD 7.5X6 YLW CONV (MISCELLANEOUS) ×6 IMPLANT
PENCIL BUTTON HOLSTER BLD 10FT (ELECTRODE) ×3 IMPLANT
PUNCH AORTIC ROTATE 4.0MM (MISCELLANEOUS) IMPLANT
PUNCH AORTIC ROTATE 4.5MM 8IN (MISCELLANEOUS) IMPLANT
PUNCH AORTIC ROTATE 5MM 8IN (MISCELLANEOUS) IMPLANT
SET CARDIOPLEGIA MPS 5001102 (MISCELLANEOUS) ×2 IMPLANT
SOLUTION ANTI FOG 6CC (MISCELLANEOUS) IMPLANT
SPONGE GAUZE 4X4 12PLY (GAUZE/BANDAGES/DRESSINGS) ×6 IMPLANT
SPONGE INTESTINAL PEANUT (DISPOSABLE) IMPLANT
SPONGE LAP 18X18 X RAY DECT (DISPOSABLE) IMPLANT
SPONGE LAP 4X18 X RAY DECT (DISPOSABLE) ×2 IMPLANT
SUT BONE WAX W31G (SUTURE) ×3 IMPLANT
SUT ETHIBOND 2 0 SH (SUTURE) ×3
SUT ETHIBOND 2 0 SH 36X2 (SUTURE) IMPLANT
SUT MNCRL AB 4-0 PS2 18 (SUTURE) ×2 IMPLANT
SUT PROLENE 3 0 SH DA (SUTURE) IMPLANT
SUT PROLENE 3 0 SH1 36 (SUTURE) IMPLANT
SUT PROLENE 4 0 RB 1 (SUTURE) ×3
SUT PROLENE 4 0 SH DA (SUTURE) ×7 IMPLANT
SUT PROLENE 4-0 RB1 .5 CRCL 36 (SUTURE) ×1 IMPLANT
SUT PROLENE 5 0 C 1 36 (SUTURE) IMPLANT
SUT PROLENE 6 0 C 1 30 (SUTURE) ×4 IMPLANT
SUT PROLENE 6 0 CC (SUTURE) IMPLANT
SUT PROLENE 7 0 BV 1 (SUTURE) IMPLANT
SUT PROLENE 7 0 BV1 MDA (SUTURE) IMPLANT
SUT PROLENE 7 0 DA (SUTURE) IMPLANT
SUT PROLENE 7.0 RB 3 (SUTURE) ×9 IMPLANT
SUT PROLENE 8 0 BV175 6 (SUTURE) ×8 IMPLANT
SUT PROLENE BLUE 7 0 (SUTURE) ×3 IMPLANT
SUT PROLENE POLY MONO (SUTURE) IMPLANT
SUT SILK  1 MH (SUTURE)
SUT SILK 1 MH (SUTURE) IMPLANT
SUT SILK 2 0 SH CR/8 (SUTURE) IMPLANT
SUT SILK 3 0 SH CR/8 (SUTURE) IMPLANT
SUT STEEL 6MS V (SUTURE) ×6 IMPLANT
SUT STEEL STERNAL CCS#1 18IN (SUTURE) IMPLANT
SUT STEEL SZ 6 DBL 3X14 BALL (SUTURE) ×5 IMPLANT
SUT VIC AB 1 CTX 18 (SUTURE) IMPLANT
SUT VIC AB 1 CTX 36 (SUTURE) ×6
SUT VIC AB 1 CTX36XBRD ANBCTR (SUTURE) ×2 IMPLANT
SUT VIC AB 2-0 CT1 27 (SUTURE) ×3
SUT VIC AB 2-0 CT1 TAPERPNT 27 (SUTURE) IMPLANT
SUT VIC AB 2-0 CTX 27 (SUTURE) ×2 IMPLANT
SUT VIC AB 3-0 SH 27 (SUTURE)
SUT VIC AB 3-0 SH 27X BRD (SUTURE) IMPLANT
SUT VIC AB 3-0 X1 27 (SUTURE) IMPLANT
SUT VICRYL 4-0 PS2 18IN ABS (SUTURE) IMPLANT
SUTURE E-PAK OPEN HEART (SUTURE) ×3 IMPLANT
SYSTEM SAHARA CHEST DRAIN ATS (WOUND CARE) ×3 IMPLANT
TOWEL OR 17X24 6PK STRL BLUE (TOWEL DISPOSABLE) ×6 IMPLANT
TOWEL OR 17X26 10 PK STRL BLUE (TOWEL DISPOSABLE) ×6 IMPLANT
TRAY FOLEY IC TEMP SENS 16FR (CATHETERS) ×3 IMPLANT
TUBING INSUFFLATION 10FT LAP (TUBING) ×3 IMPLANT
UNDERPAD 30X30 INCONTINENT (UNDERPADS AND DIAPERS) ×3 IMPLANT
WATER STERILE IRR 1000ML POUR (IV SOLUTION) ×6 IMPLANT

## 2011-03-18 NOTE — Progress Notes (Signed)
The patient was examined and preop studies reviewed. There has been no change from the prior exam and the patient is ready for surgery.  Plan CABG for multivessel CAD    

## 2011-03-18 NOTE — Preoperative (Signed)
Beta Blockers   Reason not to administer Beta Blockers:Not Applicable 

## 2011-03-18 NOTE — Progress Notes (Signed)
UR Completed.  William Dyer Jane [phone] 03/18/2011  

## 2011-03-18 NOTE — Procedures (Signed)
Extubation Procedure Note  Patient Details:   Name: William Dyer DOB: Feb 23, 1945 MRN: [id number]   Airway Documentation:  Airway 8 mm (Active)  Secured at (cm) 2 cm 03/18/2011 10:15 PM  Measured From Lips 03/18/2011  9:17 PM  Secured Location Right 03/18/2011  9:17 PM  Secured By Caron Presume Tape 03/18/2011  9:17 PM  Site Condition Dry 03/18/2011  9:17 PM    Evaluation  O2 sats: stable throughout Complications: No apparent complications Patient did tolerate procedure well. Bilateral Breath Sounds: Clear Suctioning: Oral Yes Pt. Was extubated to a 6L Start without any complications, dyspnea or stridor noted. Pt. Achieved 1.3L on VC & -40 on NIF. Pt. Was instructed on IS X 5. Highest goal achieved was .  Sheryle Hail 03/18/2011, 22:15 PM

## 2011-03-18 NOTE — OR Nursing (Signed)
Family called and Unit 2300 at removal of retractor.

## 2011-03-18 NOTE — Transfer of Care (Signed)
Immediate Anesthesia Transfer of Care Note  Patient: William Dyer  Procedure(s) Performed:  CORONARY ARTERY BYPASS GRAFTING (CABG)  Patient Location: PACU  Anesthesia Type: General  Level of Consciousness: Patient remains intubated per anesthesia plan  Airway & Oxygen Therapy: Patient remains intubated per anesthesia plan  Post-op Assessment: Report given to PACU RN  Post vital signs: Reviewed and stable  Complications: No apparent anesthesia complications

## 2011-03-18 NOTE — Anesthesia Preprocedure Evaluation (Addendum)
Anesthesia Evaluation  Patient identified by MRN, date of birth, ID band Patient awake    Reviewed: Allergy & Precautions, H&P , NPO status , Patient's Chart, lab work & pertinent test results  Airway Mallampati: II TM Distance: >3 FB Neck ROM: Full    Dental  (+) Edentulous Upper, Missing, Poor Dentition and Dental Advisory Given   Pulmonary shortness of breath and Long-Term Oxygen Therapy, pneumonia , COPD COPD inhaler and oxygen dependent,    Pulmonary exam normal       Cardiovascular + angina + CAD + Valvular Problems/Murmurs     Neuro/Psych    GI/Hepatic GERD-  ,  Endo/Other  Diabetes mellitus-, Type 2, Oral Hypoglycemic AgentsHypothyroidism   Renal/GU      Musculoskeletal   Abdominal   Peds  Hematology   Anesthesia Other Findings   Reproductive/Obstetrics                          Anesthesia Physical Anesthesia Plan  ASA: IV  Anesthesia Plan: General   Post-op Pain Management:    Induction: Intravenous  Airway Management Planned: Oral ETT  Additional Equipment: PA Cath and Ultrasound Guidance Line Placement  Intra-op Plan:   Post-operative Plan: Post-operative intubation/ventilation  Informed Consent: I have reviewed the patients History and Physical, chart, labs and discussed the procedure including the risks, benefits and alternatives for the proposed anesthesia with the patient or authorized representative who has indicated his/her understanding and acceptance.   Dental advisory given  Plan Discussed with: CRNA, Anesthesiologist and Surgeon  Anesthesia Plan Comments:         Anesthesia Quick Evaluation

## 2011-03-18 NOTE — Anesthesia Postprocedure Evaluation (Signed)
Anesthesia Post Note  Patient: William Dyer  Procedure(s) Performed:  CORONARY ARTERY BYPASS GRAFTING (CABG)  Anesthesia type: General  Patient location: ICU  Post pain: Pain level controlled  Post assessment: Post-op Vital signs reviewed  Last Vitals:  Filed Vitals:   03/18/11 1500  BP: 97/62  Pulse: 95  Temp: 36.9 C  Resp: 11    Post vital signs: stable  Level of consciousness: Patient remains intubated per anesthesia plan  Complications: No apparent anesthesia complications

## 2011-03-18 NOTE — Brief Op Note (Signed)
03/14/2011 - 03/18/2011  11:43 AM  PATIENT:  William Dyer  66 y.o. male  PRE-OPERATIVE DIAGNOSIS:  Coronary Artery Disease  POST-OPERATIVE DIAGNOSIS:  Coronary Artery Disease  PROCEDURE:  Procedure(s): CORONARY ARTERY BYPASS GRAFTING (CABG)x 3 (LIMA to LAD, SVG to Diagonal, SVG to RCA) with EVH from the right thigh.  SURGEON: Mikey Bussing, MD  PHYSICIAN ASSISTANT: Doree Fudge PA-C  ASSISTANTS: Minda Ditto RNFA  ANESTHESIA:   general  EBL:  Total I/O In: -  Out: 550 [Urine:550]    DRAINS:  Chest Tube(s) in the mediastinal and pleural space    COUNTS CORRECT:  YES   DICTATION: .Dragon Dictation  PLAN OF CARE: Admit to inpatient   PATIENT DISPOSITION:  ICU - intubated and hemodynamically stable.   Delay start of Pharmacological VTE agent (>24hrs) due to surgical blood loss or risk of bleeding:  YES  PRE OP WEIGHT: 88 kg

## 2011-03-19 ENCOUNTER — Inpatient Hospital Stay (HOSPITAL_COMMUNITY): Payer: Medicare Other

## 2011-03-19 ENCOUNTER — Other Ambulatory Visit: Payer: Self-pay

## 2011-03-19 DIAGNOSIS — IMO0001 Reserved for inherently not codable concepts without codable children: Secondary | ICD-10-CM

## 2011-03-19 DIAGNOSIS — E1165 Type 2 diabetes mellitus with hyperglycemia: Secondary | ICD-10-CM

## 2011-03-19 LAB — GLUCOSE, CAPILLARY
Glucose-Capillary: 114 mg/dL — ABNORMAL HIGH (ref 70–99)
Glucose-Capillary: 140 mg/dL — ABNORMAL HIGH (ref 70–99)
Glucose-Capillary: 168 mg/dL — ABNORMAL HIGH (ref 70–99)
Glucose-Capillary: 168 mg/dL — ABNORMAL HIGH (ref 70–99)

## 2011-03-19 LAB — POCT I-STAT, CHEM 8
HCT: 39 % (ref 39.0–52.0)
Hemoglobin: 13.3 g/dL (ref 13.0–17.0)
Potassium: 4.3 mEq/L (ref 3.5–5.1)
Sodium: 137 mEq/L (ref 135–145)
TCO2: 25 mmol/L (ref 0–100)

## 2011-03-19 LAB — BASIC METABOLIC PANEL WITH GFR
BUN: 14 mg/dL (ref 6–23)
CO2: 26 meq/L (ref 19–32)
Calcium: 7.9 mg/dL — ABNORMAL LOW (ref 8.4–10.5)
Chloride: 105 meq/L (ref 96–112)
Creatinine, Ser: 0.73 mg/dL (ref 0.50–1.35)
GFR calc Af Amer: >90 mL/min
GFR calc non Af Amer: >90 mL/min
Glucose, Bld: 100 mg/dL — ABNORMAL HIGH (ref 70–99)
Potassium: 4.4 meq/L (ref 3.5–5.1)
Sodium: 137 meq/L (ref 135–145)

## 2011-03-19 LAB — POCT I-STAT 3, ART BLOOD GAS (G3+)
Acid-base deficit: 1 mmol/L (ref 0.0–2.0)
O2 Saturation: 87 %
pO2, Arterial: 59 mmHg — ABNORMAL LOW (ref 80.0–100.0)

## 2011-03-19 LAB — CBC
HCT: 36.8 % — ABNORMAL LOW (ref 39.0–52.0)
Hemoglobin: 12.5 g/dL — ABNORMAL LOW (ref 13.0–17.0)
Hemoglobin: 13 g/dL (ref 13.0–17.0)
MCH: 31.4 pg (ref 26.0–34.0)
MCHC: 34 g/dL (ref 30.0–36.0)
RBC: 4.12 MIL/uL — ABNORMAL LOW (ref 4.22–5.81)
RDW: 14.4 % (ref 11.5–15.5)
WBC: 19 10*3/uL — ABNORMAL HIGH (ref 4.0–10.5)

## 2011-03-19 LAB — CREATININE, SERUM
Creatinine, Ser: 0.78 mg/dL (ref 0.50–1.35)
GFR calc non Af Amer: >90 mL/min (ref >90)

## 2011-03-19 LAB — MAGNESIUM: Magnesium: 2.1 mg/dL (ref 1.5–2.5)

## 2011-03-19 MED ORDER — INSULIN ASPART 100 UNIT/ML ~~LOC~~ SOLN
0.0000 [IU] | SUBCUTANEOUS | Status: DC
  Administered 2011-03-19: 2 [IU] via SUBCUTANEOUS
  Administered 2011-03-19 (×2): 4 [IU] via SUBCUTANEOUS
  Administered 2011-03-19: 2 [IU] via SUBCUTANEOUS
  Administered 2011-03-20: 4 [IU] via SUBCUTANEOUS
  Administered 2011-03-20: 2 [IU] via SUBCUTANEOUS
  Administered 2011-03-20 (×2): 4 [IU] via SUBCUTANEOUS
  Administered 2011-03-20: 0 [IU] via SUBCUTANEOUS
  Administered 2011-03-21: 2 [IU] via SUBCUTANEOUS
  Administered 2011-03-21: 4 [IU] via SUBCUTANEOUS
  Administered 2011-03-21 (×2): 2 [IU] via SUBCUTANEOUS

## 2011-03-19 MED ORDER — FENTANYL CITRATE 0.05 MG/ML IJ SOLN
50.0000 ug | INTRAMUSCULAR | Status: DC | PRN
  Administered 2011-03-19 – 2011-03-21 (×7): 50 ug via INTRAVENOUS
  Filled 2011-03-19 (×6): qty 2

## 2011-03-19 MED ORDER — TRAMADOL HCL 50 MG PO TABS
50.0000 mg | ORAL_TABLET | Freq: Four times a day (QID) | ORAL | Status: DC | PRN
  Administered 2011-03-20 – 2011-03-21 (×3): 50 mg via ORAL
  Filled 2011-03-19 (×3): qty 1

## 2011-03-19 MED ORDER — FUROSEMIDE 10 MG/ML IJ SOLN
40.0000 mg | Freq: Once | INTRAMUSCULAR | Status: AC
  Administered 2011-03-19: 40 mg via INTRAVENOUS
  Filled 2011-03-19: qty 4

## 2011-03-19 MED ORDER — NICOTINE 21 MG/24HR TD PT24
21.0000 mg | MEDICATED_PATCH | Freq: Every day | TRANSDERMAL | Status: DC
  Administered 2011-03-19 – 2011-03-21 (×3): 21 mg via TRANSDERMAL
  Filled 2011-03-19 (×3): qty 1

## 2011-03-19 MED ORDER — INSULIN GLARGINE 100 UNIT/ML ~~LOC~~ SOLN
24.0000 [IU] | Freq: Every day | SUBCUTANEOUS | Status: DC
  Administered 2011-03-19 – 2011-03-21 (×3): 24 [IU] via SUBCUTANEOUS
  Filled 2011-03-19: qty 3

## 2011-03-19 NOTE — Op Note (Signed)
NAMENEPHTALI, DOCKEN                ACCOUNT NO.:  [id number]  MEDICAL RECORD NO.:  [id number]  LOCATION:  2315                         FACILITY:  [facility]  PHYSICIAN:  Kerin Perna, M.D.  DATE OF BIRTH:  [birth year]  DATE OF PROCEDURE:  03/18/2011 DATE OF DISCHARGE:                              OPERATIVE REPORT   OPERATION: 1. Coronary artery bypass grafting x3 (left internal mammary artery to     LAD, saphenous vein graft to diagonal, saphenous vein graft to     right coronary artery). 2. Endoscopic harvest of right leg greater saphenous vein.  PREOPERATIVE DIAGNOSIS:  Unstable angina with severe multivessel coronary artery disease.  POSTOPERATIVE DIAGNOSIS:  Unstable angina with severe multivessel coronary artery disease.  SURGEON:  Kerin Perna, MD  ASSISTANT:  Doree Fudge, PA-C  ANESTHESIA:  General by Dr. Claybon Jabs.  INDICATIONS:  The patient is a 66 year old obese Caucasian male, diabetic smoker with prior history of coronary artery disease treated with percutaneous intervention.  He now presents with unstable angina. Echo showed fairly well-preserved LV function, but cardiac catheterization demonstrated high-grade stenosis of the LAD with previously placed more proximal stents now stenotic.  There is also a nondominant right coronary with a 90% stenosis.  The circumflex had no significant disease.  He was felt to be a candidate for surgical coronary revascularization.  Prior to surgery, I examined the patient in his hospital room and reviewed the results of the cardiac cath with the patient and family.  I discussed the indications and expected benefits of coronary artery bypass grafting for treatment of his coronary artery disease.  I reviewed the major aspects of surgery including the use of general anesthesia and cardiopulmonary bypass, the expected postoperative hospital recovery, the location of the surgical incisions, and the potential risks of  stroke, MI, bleeding, infection, prolonged ventilator dependence, and death.  He understood that due to his severe COPD from heavy smoking history, he would be at increased risk for postoperative complications including pneumonia, prolonged oxygen requirement, prolonged ventilator dependence, and tracheostomy.  After reviewing these issues, he demonstrated his understanding and agreed to proceed with the surgery under what I felt was an informed consent.  OPERATIVE FINDINGS: 1. Small mammary artery 1.2-mm, but with good flow. 2. No blood transfusion required for this operation. 3. Small native right coronary.  PROCEDURE:  The patient was brought to the operating room and placed supine on the operating room table, where general anesthesia was induced.  The chest, abdomen and legs were prepped with Betadine and draped as a sterile field.  A sternal incision was made as the saphenous vein was harvested endoscopically from the right leg.  The left internal mammary artery was harvested as a pedicle graft from its origin at the subclavian vessels.  It was a small vessel, but had good flow.  The sternal retractor was then placed using the deep blades due to the patient's obese body habitus.  The pericardium was opened and suspended. Heparin was administered and pursestrings were placed in the ascending aorta and right atrium.  The patient was then cannulated and placed on the cardiopulmonary bypass.  The coronaries were identified for grafting.  The mammary artery and vein grafts were prepared for the distal anastomoses.  Cardioplegic cannulas were placed for both antegrade and retrograde cold blood cardioplegia.  The patient was cooled to 32 degrees and the aortic crossclamp was applied.  A 1 L of cold blood cardioplegia was delivered in split doses between the antegrade aortic and retrograde coronary sinus catheters.  There was good cardioplegic arrest and septal temperature dropped less  than 15 degrees.  Cardioplegia was delivered every 20 minutes.  The distal coronary anastomoses were then performed.  The first distal anastomosis was to the right coronary.  This was small and nondominant. It was a 1.3-mm vessel just beyond the acute margin.  The reverse saphenous vein was sewn end-to-side with running 8-0 Prolene.  There was good flow through the graft.  The second distal anastomosis was to the distal LAD.  The LAD was 1.5-mm vessel.  The mammary artery was a 1.1-mm vessel.  The mammary pedicle was brought through an opening and the pericardium was brought down onto the LAD and sewn end-to-side with running 8-0 Prolene.  There was good flow through the anastomosis, but the mammary was fairly small.  A vein graft was then placed to the diagonal branch to the LAD in order to prove improved blood flow to the anterior wall.  The diagonal had a tight proximal stenosis.  Reverse saphenous vein was sewn end-to-side with running 7 Prolene with good flow through the graft.  While the crossclamp was still in place, 2 proximal vein anastomoses were performed on the ascending aorta using a 4.0-mm punch with running 7-0 Prolene.  Prior to tying down the final proximal anastomosis, air was vented from the coronaries with a dose of retrograde warm blood cardioplegia.  The crossclamp was then removed.  The heart was cardioverted back to a regular rhythm.  The patient was rewarmed to 37 degrees.  The grafts were open and each had good flow. Hemostasis was documented in the proximal distal anastomoses.  Temporary pacing wires were applied.  The lungs were expanded and the ventilator was resumed.  It should be noted the patient's lungs were highly inflamed and with severe COPD.  The patient was weaned off bypass on low- dose dopamine.  Overall, function looked good.  Cardiac output and hemodynamics remained stable.  Protamine was administered without adverse reaction.  The cannula  was removed and the mediastinum was irrigated with warm saline.  The patient remained hemodynamically stable.  Hemostasis was obtained.  The superior pericardial fat was closed over the aorta.  Then, in the anterior mediastinal and a left pleural chest tube were placed and brought through separate incisions. The sternum was closed with wire.  The pectoralis fascia was closed with a running #1 Vicryl.  The subcutaneous and skin layers were closed with Vicryl and sterile dressings were applied.  Total bypass time was 110 minutes.    Kerin Perna, M.D.    PV/MEDQ  D:  03/18/2011  T:  03/19/2011  Job:  [id number]  cc:   Noralyn Pick. Eden Emms, MD, [facility]

## 2011-03-19 NOTE — Progress Notes (Signed)
Patient examined and record reviewed.Hemodynamics stable,labs satisfactory.Patient had stable day.Continue current care. VAN TRIGT III,PETER 03/19/2011    

## 2011-03-20 ENCOUNTER — Inpatient Hospital Stay (HOSPITAL_COMMUNITY): Payer: Medicare Other

## 2011-03-20 LAB — BASIC METABOLIC PANEL
BUN: 19 mg/dL (ref 6–23)
CO2: 32 mEq/L (ref 19–32)
Calcium: 8.7 mg/dL (ref 8.4–10.5)
Chloride: 99 mEq/L (ref 96–112)
Creatinine, Ser: 0.8 mg/dL (ref 0.50–1.35)
GFR calc Af Amer: >90 mL/min (ref >90)
GFR calc non Af Amer: >90 mL/min (ref >90)
Glucose, Bld: 142 mg/dL — ABNORMAL HIGH (ref 70–99)
Potassium: 4.1 mEq/L (ref 3.5–5.1)
Sodium: 135 mEq/L (ref 135–145)

## 2011-03-20 LAB — TYPE AND SCREEN
ABO/RH(D): A NEG
Antibody Screen: NEGATIVE
Unit division: 0
Unit division: 0

## 2011-03-20 LAB — CBC
HCT: 34.7 % — ABNORMAL LOW (ref 39.0–52.0)
Hemoglobin: 11.7 g/dL — ABNORMAL LOW (ref 13.0–17.0)
MCH: 31.9 pg (ref 26.0–34.0)
MCHC: 33.7 g/dL (ref 30.0–36.0)
MCV: 94.6 fL (ref 78.0–100.0)
Platelets: 119 10*3/uL — ABNORMAL LOW (ref 150–400)
RBC: 3.67 MIL/uL — ABNORMAL LOW (ref 4.22–5.81)
RDW: 14.8 % (ref 11.5–15.5)
WBC: 16.1 10*3/uL — ABNORMAL HIGH (ref 4.0–10.5)

## 2011-03-20 LAB — GLUCOSE, CAPILLARY
Glucose-Capillary: 122 mg/dL — ABNORMAL HIGH (ref 70–99)
Glucose-Capillary: 162 mg/dL — ABNORMAL HIGH (ref 70–99)
Glucose-Capillary: 92 mg/dL (ref 70–99)

## 2011-03-20 MED ORDER — SODIUM CHLORIDE 0.45 % IV SOLN
INTRAVENOUS | Status: DC
  Administered 2011-03-20: 10 mL via INTRAVENOUS

## 2011-03-20 NOTE — Progress Notes (Signed)
Postop day 2 CABG x3 for unstable angina  History of severe COPD with active smoking, continued O2 requirement Ambulating in all chest x-ray without effusion Diuresis and well maintaining sinus rhythm Plan for transfer to step down probably in a.m. but high risk for A. fib

## 2011-03-21 ENCOUNTER — Encounter (HOSPITAL_COMMUNITY): Payer: Self-pay | Admitting: Cardiothoracic Surgery

## 2011-03-21 ENCOUNTER — Inpatient Hospital Stay (HOSPITAL_COMMUNITY): Payer: Medicare Other

## 2011-03-21 DIAGNOSIS — IMO0001 Reserved for inherently not codable concepts without codable children: Secondary | ICD-10-CM

## 2011-03-21 DIAGNOSIS — E1165 Type 2 diabetes mellitus with hyperglycemia: Secondary | ICD-10-CM

## 2011-03-21 LAB — BASIC METABOLIC PANEL
BUN: 21 mg/dL (ref 6–23)
CO2: 31 mEq/L (ref 19–32)
Calcium: 8.6 mg/dL (ref 8.4–10.5)
Chloride: 97 mEq/L (ref 96–112)
Creatinine, Ser: 0.65 mg/dL (ref 0.50–1.35)
GFR calc Af Amer: >90 mL/min (ref >90)
GFR calc non Af Amer: >90 mL/min (ref >90)
Glucose, Bld: 146 mg/dL — ABNORMAL HIGH (ref 70–99)
Potassium: 4 mEq/L (ref 3.5–5.1)
Sodium: 132 mEq/L — ABNORMAL LOW (ref 135–145)

## 2011-03-21 LAB — GLUCOSE, CAPILLARY
Glucose-Capillary: 134 mg/dL — ABNORMAL HIGH (ref 70–99)
Glucose-Capillary: 154 mg/dL — ABNORMAL HIGH (ref 70–99)
Glucose-Capillary: 158 mg/dL — ABNORMAL HIGH (ref 70–99)

## 2011-03-21 LAB — CBC
HCT: 33 % — ABNORMAL LOW (ref 39.0–52.0)
Hemoglobin: 10.9 g/dL — ABNORMAL LOW (ref 13.0–17.0)
MCH: 31.6 pg (ref 26.0–34.0)
MCHC: 33 g/dL (ref 30.0–36.0)
MCV: 95.7 fL (ref 78.0–100.0)
Platelets: 123 10*3/uL — ABNORMAL LOW (ref 150–400)
RBC: 3.45 MIL/uL — ABNORMAL LOW (ref 4.22–5.81)
RDW: 14.9 % (ref 11.5–15.5)
WBC: 14.2 10*3/uL — ABNORMAL HIGH (ref 4.0–10.5)

## 2011-03-21 MED ORDER — TRAMADOL HCL 50 MG PO TABS
50.0000 mg | ORAL_TABLET | ORAL | Status: DC | PRN
  Administered 2011-03-21: 100 mg via ORAL
  Administered 2011-03-22 – 2011-03-24 (×3): 50 mg via ORAL
  Filled 2011-03-21 (×3): qty 1
  Filled 2011-03-21: qty 2

## 2011-03-21 MED ORDER — ASPIRIN EC 81 MG PO TBEC
81.0000 mg | DELAYED_RELEASE_TABLET | Freq: Every day | ORAL | Status: DC
  Administered 2011-03-22 – 2011-03-25 (×4): 81 mg via ORAL
  Filled 2011-03-21 (×5): qty 1

## 2011-03-21 MED ORDER — DIPHENHYDRAMINE HCL 25 MG PO CAPS: 25.0000 mg | ORAL_CAPSULE | Freq: Every evening | ORAL | Status: DC | PRN

## 2011-03-21 MED ORDER — GUAIFENESIN ER 600 MG PO TB12
600.0000 mg | ORAL_TABLET | Freq: Two times a day (BID) | ORAL | Status: DC
  Administered 2011-03-21 – 2011-03-25 (×9): 600 mg via ORAL
  Filled 2011-03-21 (×10): qty 1

## 2011-03-21 MED ORDER — CLOPIDOGREL BISULFATE 75 MG PO TABS
75.0000 mg | ORAL_TABLET | Freq: Every day | ORAL | Status: DC
  Administered 2011-03-22 – 2011-03-25 (×4): 75 mg via ORAL
  Filled 2011-03-21 (×5): qty 1

## 2011-03-21 MED ORDER — ALPRAZOLAM 0.25 MG PO TABS: 0.2500 mg | ORAL_TABLET | Freq: Every evening | ORAL | Status: DC | PRN

## 2011-03-21 MED ORDER — LEVALBUTEROL TARTRATE 45 MCG/ACT IN AERO
2.0000 | INHALATION_SPRAY | Freq: Four times a day (QID) | RESPIRATORY_TRACT | Status: DC
  Administered 2011-03-21 – 2011-03-22 (×3): 2 via RESPIRATORY_TRACT
  Filled 2011-03-21: qty 15

## 2011-03-21 MED ORDER — METOPROLOL TARTRATE 12.5 MG HALF TABLET
12.5000 mg | ORAL_TABLET | Freq: Two times a day (BID) | ORAL | Status: DC
  Administered 2011-03-21: 12.5 mg via ORAL
  Filled 2011-03-21 (×3): qty 1

## 2011-03-21 MED ORDER — LORAZEPAM 1 MG PO TABS
1.0000 mg | ORAL_TABLET | Freq: Every day | ORAL | Status: DC
  Administered 2011-03-21 – 2011-03-24 (×4): 1 mg via ORAL
  Filled 2011-03-21 (×4): qty 1

## 2011-03-21 MED ORDER — POTASSIUM CHLORIDE CRYS ER 20 MEQ PO TBCR
20.0000 meq | EXTENDED_RELEASE_TABLET | Freq: Every day | ORAL | Status: DC
  Administered 2011-03-22 – 2011-03-25 (×4): 20 meq via ORAL
  Filled 2011-03-21 (×5): qty 1

## 2011-03-21 MED ORDER — HYDROMORPHONE HCL 2 MG PO TABS: 2.0000 mg | ORAL_TABLET | ORAL | Status: DC | PRN

## 2011-03-21 MED ORDER — GUAIFENESIN ER 600 MG PO TB12
600.0000 mg | ORAL_TABLET | Freq: Two times a day (BID) | ORAL | Status: DC | PRN
  Filled 2011-03-21: qty 1

## 2011-03-21 MED ORDER — INSULIN ASPART 100 UNIT/ML ~~LOC~~ SOLN
0.0000 [IU] | Freq: Three times a day (TID) | SUBCUTANEOUS | Status: DC
  Administered 2011-03-21 – 2011-03-22 (×3): 2 [IU] via SUBCUTANEOUS
  Filled 2011-03-21: qty 3

## 2011-03-21 MED ORDER — SODIUM CHLORIDE 0.9 % IJ SOLN: 3.0000 mL | INTRAMUSCULAR | Status: DC | PRN

## 2011-03-21 MED ORDER — LACTULOSE 10 GM/15ML PO SOLN
20.0000 g | Freq: Once | ORAL | Status: AC
  Administered 2011-03-21: 20 g via ORAL
  Filled 2011-03-21: qty 30

## 2011-03-21 MED ORDER — FUROSEMIDE 40 MG PO TABS
40.0000 mg | ORAL_TABLET | Freq: Every day | ORAL | Status: DC
  Filled 2011-03-21 (×2): qty 1

## 2011-03-21 MED ORDER — FUROSEMIDE 40 MG PO TABS
40.0000 mg | ORAL_TABLET | Freq: Every day | ORAL | Status: DC
  Administered 2011-03-21 – 2011-03-25 (×5): 40 mg via ORAL
  Filled 2011-03-21 (×5): qty 1

## 2011-03-21 MED ORDER — POTASSIUM CHLORIDE CRYS ER 20 MEQ PO TBCR
20.0000 meq | EXTENDED_RELEASE_TABLET | Freq: Every day | ORAL | Status: DC
  Administered 2011-03-21: 20 meq via ORAL
  Filled 2011-03-21: qty 1

## 2011-03-21 MED ORDER — DOCUSATE SODIUM 100 MG PO CAPS
200.0000 mg | ORAL_CAPSULE | Freq: Every day | ORAL | Status: DC
  Administered 2011-03-22 – 2011-03-25 (×4): 200 mg via ORAL
  Filled 2011-03-21 (×4): qty 2

## 2011-03-21 MED ORDER — LEVALBUTEROL HCL 1.25 MG/0.5ML IN NEBU
1.2500 mg | INHALATION_SOLUTION | Freq: Three times a day (TID) | RESPIRATORY_TRACT | Status: DC
  Filled 2011-03-21 (×8): qty 0.5

## 2011-03-21 MED ORDER — BISACODYL 5 MG PO TBEC: 10.0000 mg | DELAYED_RELEASE_TABLET | Freq: Every day | ORAL | Status: DC | PRN

## 2011-03-21 MED ORDER — BISACODYL 10 MG RE SUPP
10.0000 mg | Freq: Every day | RECTAL | Status: DC | PRN
  Administered 2011-03-23: 10 mg via RECTAL
  Filled 2011-03-21: qty 1

## 2011-03-21 MED ORDER — SODIUM CHLORIDE 0.9 % IV SOLN: 250.0000 mL | INTRAVENOUS | Status: DC | PRN

## 2011-03-21 MED ORDER — NICOTINE 21 MG/24HR TD PT24
21.0000 mg | MEDICATED_PATCH | Freq: Every day | TRANSDERMAL | Status: DC
  Administered 2011-03-22 – 2011-03-25 (×4): 21 mg via TRANSDERMAL
  Filled 2011-03-21 (×5): qty 1

## 2011-03-21 MED ORDER — ALPRAZOLAM 0.25 MG PO TABS: 0.2500 mg | ORAL_TABLET | Freq: Four times a day (QID) | ORAL | Status: DC | PRN

## 2011-03-21 MED ORDER — ACETAMINOPHEN 325 MG PO TABS: 650.0000 mg | ORAL_TABLET | Freq: Four times a day (QID) | ORAL | Status: DC | PRN

## 2011-03-21 MED ORDER — SODIUM CHLORIDE 0.9 % IJ SOLN
3.0000 mL | Freq: Two times a day (BID) | INTRAMUSCULAR | Status: DC
  Administered 2011-03-21 – 2011-03-24 (×6): 3 mL via INTRAVENOUS

## 2011-03-21 MED ORDER — MOVING RIGHT ALONG BOOK
Freq: Once | Status: AC
  Administered 2011-03-21: 18:00:00
  Filled 2011-03-21: qty 1

## 2011-03-21 MED ORDER — PANTOPRAZOLE SODIUM 40 MG PO TBEC
40.0000 mg | DELAYED_RELEASE_TABLET | Freq: Every day | ORAL | Status: DC
  Administered 2011-03-22 – 2011-03-25 (×4): 40 mg via ORAL
  Filled 2011-03-21 (×4): qty 1

## 2011-03-21 MED ORDER — ROSUVASTATIN CALCIUM 10 MG PO TABS
10.0000 mg | ORAL_TABLET | Freq: Every day | ORAL | Status: DC
  Administered 2011-03-22 – 2011-03-24 (×3): 10 mg via ORAL
  Filled 2011-03-21 (×5): qty 1

## 2011-03-21 MED FILL — Heparin Sodium (Porcine) Inj 1000 Unit/ML: INTRAMUSCULAR | Qty: 10 | Status: AC

## 2011-03-21 MED FILL — Magnesium Sulfate Inj 50%: INTRAMUSCULAR | Qty: 10 | Status: AC

## 2011-03-21 MED FILL — Potassium Chloride Inj 2 mEq/ML: INTRAVENOUS | Qty: 40 | Status: AC

## 2011-03-21 MED FILL — Verapamil HCl IV Soln 2.5 MG/ML: INTRAVENOUS | Qty: 4 | Status: AC

## 2011-03-21 MED FILL — Nitroglycerin IV Soln 5 MG/ML: INTRAVENOUS | Qty: 10 | Status: AC

## 2011-03-21 MED FILL — Lactated Ringer's Solution: INTRAVENOUS | Qty: 500 | Status: AC

## 2011-03-21 NOTE — Progress Notes (Signed)
Betadine incisions 

## 2011-03-21 NOTE — Progress Notes (Addendum)
3 Days Post-Op Procedure(s) (LRB): CORONARY ARTERY BYPASS GRAFTING (CABG) (N/A)  Subjective: Patient unable to sleep in bed secondary to back spasms and is requesting xanax as that helps him. He also has complaints of a productive cough and some incisional pain. Passing flatus but no bm yet.  Objective: Vital signs in last 24 hours: Patient Vitals for the past 24 hrs:  BP Temp Temp src Pulse Resp SpO2 Weight  03/21/11 0817 - 97.4 F (36.3 C) Oral - - - -  03/21/11 0812 - - - - - 93 % -  03/21/11 0700 117/67 mmHg - - 89  15  93 % -  03/21/11 0600 125/91 mmHg - - 94  29  93 % 204 lb 12.9 oz (92.9 kg)  03/21/11 0500 130/67 mmHg - - 92  22  93 % -  03/21/11 0400 113/74 mmHg - - 92  24  92 % -  03/21/11 0344 - 98.1 F (36.7 C) Oral - - - -  03/21/11 0300 111/61 mmHg - - 91  16  93 % -  03/21/11 0200 114/65 mmHg - - 93  17  92 % -  03/21/11 0100 113/65 mmHg - - 94  16  93 % -  03/21/11 0000 - - - 96  28  90 % -  03/20/11 2317 - 98.2 F (36.8 C) Oral - - - -  03/20/11 2300 133/64 mmHg - - 96  20  92 % -  03/20/11 2200 131/64 mmHg - - 92  22  90 % -   Pre op weight and 88 kg Current Weight  03/21/11 204 lb 12.9 oz (92.9 kg)    Intake/Output from previous day: 02/10 0701 - 02/11 0700 In: 1224 [P.O.:650; I.V.:420; IV Piggyback:4] Out: 950 [Urine:950]   Physical Exam:  Cardiovascular: RRR, no murmurs, gallops, or rubs. Pulmonary: Slightly decreased at the base; no rales, wheezes, or rhonchi. Abdomen: Soft, non tender, bowel sounds present. Extremities: Mild bilateral lower extremity edema. Wounds: Dressings I. and that lean and dry.    Lab Results: A CBC: Basename 03/21/11 0345 03/20/11 0450  WBC 14.2* 16.1*  HGB 10.9* 11.7*  HCT 33.0* 34.7*  PLT 123* 119*   BMET:  Basename 03/21/11 0345 03/20/11 0450  NA 132* 135  K 4.0 4.1  CL 97 99  CO2 31 32  GLUCOSE 146* 142*  BUN 21 19  CREATININE 0.65 0.80  CALCIUM 8.6 8.7    PT/INR:  Basename 03/18/11 1845  LABPROT  14.4  INR 1.10   ABG:  INR: Will add last result for INR, ABG once components are confirmed Will add last 4 CBG results once components are confirmed  Assessment/Plan:  1. CV - SR.Continue Lopressor 12.5 bid. 2.  Pulmonary - Encourage incentive spirometer. CXR this am shows no pneumothorax, small bilateral pleural effusions, and atelectasis (some improvement), stable cardiomegaly.Wean O2 as tolerates Has a h and istory of COPD) 3. Volume Overload - Diurese. 4.  Acute blood loss anemia - H/H this am 10.9/33. 5.Thrombocytopenia-Platelets slightly increased to 123,000. 6.Leukocytosis-WBC continues to decrease.  7.Possibly transfer to [facility]. 8.LOC for constipation.   ZIMMERMAN,DONIELLE MPA-C 03/21/2011   patient examined and medical record reviewed,agree with above note  Still desats w/ exertion,ervere COPD will transfer to [address] VAN TRIGT III,PETER 03/21/2011

## 2011-03-22 ENCOUNTER — Inpatient Hospital Stay (HOSPITAL_COMMUNITY): Payer: Medicare Other

## 2011-03-22 LAB — GLUCOSE, CAPILLARY
Glucose-Capillary: 128 mg/dL — ABNORMAL HIGH (ref 70–99)
Glucose-Capillary: 156 mg/dL — ABNORMAL HIGH (ref 70–99)

## 2011-03-22 LAB — CBC
HCT: 34.3 % — ABNORMAL LOW (ref 39.0–52.0)
Hemoglobin: 11.2 g/dL — ABNORMAL LOW (ref 13.0–17.0)
MCH: 31.4 pg (ref 26.0–34.0)
MCHC: 32.7 g/dL (ref 30.0–36.0)
MCV: 96.1 fL (ref 78.0–100.0)
Platelets: 171 10*3/uL (ref 150–400)
RBC: 3.57 MIL/uL — ABNORMAL LOW (ref 4.22–5.81)
RDW: 14.9 % (ref 11.5–15.5)
WBC: 9.9 10*3/uL (ref 4.0–10.5)

## 2011-03-22 LAB — BASIC METABOLIC PANEL
BUN: 16 mg/dL (ref 6–23)
CO2: 36 mEq/L — ABNORMAL HIGH (ref 19–32)
Calcium: 8.8 mg/dL (ref 8.4–10.5)
Chloride: 96 mEq/L (ref 96–112)
Creatinine, Ser: 0.75 mg/dL (ref 0.50–1.35)
GFR calc Af Amer: >90 mL/min (ref >90)
GFR calc non Af Amer: >90 mL/min (ref >90)
Glucose, Bld: 124 mg/dL — ABNORMAL HIGH (ref 70–99)
Potassium: 4.1 mEq/L (ref 3.5–5.1)
Sodium: 138 mEq/L (ref 135–145)

## 2011-03-22 MED ORDER — LEVALBUTEROL HCL 1.25 MG/0.5ML IN NEBU: 1.2500 mg | INHALATION_SOLUTION | RESPIRATORY_TRACT | Status: DC | PRN

## 2011-03-22 MED ORDER — METOPROLOL TARTRATE 25 MG PO TABS
25.0000 mg | ORAL_TABLET | Freq: Two times a day (BID) | ORAL | Status: DC
  Administered 2011-03-22 – 2011-03-25 (×7): 25 mg via ORAL
  Filled 2011-03-22 (×8): qty 1

## 2011-03-22 MED ORDER — METFORMIN HCL 500 MG PO TABS
500.0000 mg | ORAL_TABLET | Freq: Two times a day (BID) | ORAL | Status: DC
  Administered 2011-03-22 – 2011-03-25 (×7): 500 mg via ORAL
  Filled 2011-03-22 (×9): qty 1

## 2011-03-22 MED ORDER — FUROSEMIDE 40 MG PO TABS
40.0000 mg | ORAL_TABLET | Freq: Once | ORAL | Status: AC
  Administered 2011-03-22: 40 mg via ORAL
  Filled 2011-03-22: qty 1

## 2011-03-22 MED ORDER — LEVALBUTEROL TARTRATE 45 MCG/ACT IN AERO
2.0000 | INHALATION_SPRAY | RESPIRATORY_TRACT | Status: DC | PRN
  Administered 2011-03-24: 2 via RESPIRATORY_TRACT

## 2011-03-22 MED ORDER — POLYETHYLENE GLYCOL 3350 17 G PO PACK
17.0000 g | PACK | Freq: Once | ORAL | Status: AC
  Administered 2011-03-22: 17 g via ORAL
  Filled 2011-03-22: qty 1

## 2011-03-22 MED ORDER — POTASSIUM CHLORIDE CRYS ER 20 MEQ PO TBCR
20.0000 meq | EXTENDED_RELEASE_TABLET | Freq: Once | ORAL | Status: AC
  Administered 2011-03-22: 20 meq via ORAL

## 2011-03-22 NOTE — Progress Notes (Signed)
Covered patient with 2 units of insulin for CBG of 156 per MD order.  No where to document in [facility].  Will monitor.

## 2011-03-22 NOTE — Progress Notes (Signed)
UR Completed.  William Dyer [phone] 03/22/2011  

## 2011-03-22 NOTE — Progress Notes (Addendum)
4 Days Post-Op Procedure(s) (LRB): CORONARY ARTERY BYPASS GRAFTING (CABG) (N/A)  Subjective: Patient with some shortness of breath after ambulating to the bathroom. Also, has complaints of constipation and feeling bloated.Denies nausea, abdominal pain, or emesis.  Objective: Vital signs in last 24 hours: Patient Vitals for the past 24 hrs:  BP Temp Temp src Pulse Resp SpO2 Weight  03/22/11 0552 - - - - - 91 % -  03/22/11 0544 131/79 mmHg 97.9 F (36.6 C) Oral 93  20  88 % 202 lb 13.2 oz (92 kg)  03/21/11 2311 - - - - - 92 % -  03/21/11 2202 - - - 91  - - -  03/21/11 2143 113/57 mmHg 97 F (36.1 C) Oral 95  20  93 % -  03/21/11 2000 - - - - - 93 % -  03/21/11 1620 - 99 F (37.2 C) Oral - - - -  03/21/11 1600 113/64 mmHg - - 88  27  93 % -  03/21/11 1501 - - - - - 92 % -  03/21/11 1500 121/70 mmHg - - 88  19  94 % -  03/21/11 1400 107/66 mmHg - - 86  17  95 % -  03/21/11 1300 109/62 mmHg - - 85  - 96 % -  03/21/11 1239 - 97.7 F (36.5 C) Oral - - - -  03/21/11 1200 105/72 mmHg - - 85  19  96 % -  03/21/11 1100 98/64 mmHg - - 93  19  96 % -  03/21/11 1000 125/59 mmHg - - 95  20  93 % -  03/21/11 0900 116/55 mmHg - - 94  22  92 % -  03/21/11 0817 - 97.4 F (36.3 C) Oral - - - -  03/21/11 0812 - - - - - 93 % -  03/21/11 0800 130/68 mmHg - - 90  21  94 % -   Pre op weight  88 kg Current Weight  03/22/11 202 lb 13.2 oz (92 kg)       Intake/Output from previous day: 02/11 0701 - 02/12 0700 In: 163 [I.V.:163] Out: 1475 [Urine:1475]   Physical Exam:  Cardiovascular: RRR, no murmurs, gallops, or rubs. Pulmonary: Diminished at bases; no rales, wheezes, or rhonchi. Abdomen: Soft, non tender, distended,bowel sounds present. Extremities: Bilateral lower extremity edema. Wounds: Clean and dry.  No erythema or signs of infection.  Lab Results: CBC: Basename 03/22/11 0558 03/21/11 0345  WBC 9.9 14.2*  HGB 11.2* 10.9*  HCT 34.3* 33.0*  PLT 171 123*   BMET:  Basename  03/22/11 0558 03/21/11 0345  NA 138 132*  K 4.1 4.0  CL 96 97  CO2 36* 31  GLUCOSE 124* 146*  BUN 16 21  CREATININE 0.75 0.65  CALCIUM 8.8 8.6    PT/INR: No results found for this basename: LABPROT,INR in the last 72 hours ABG:  INR: Will add last result for INR, ABG once components are confirmed Will add last 4 CBG results once components are confirmed  Assessment/Plan:  1. CV - SR.Increase Lopressor to 25 bid and continue Plavix. 2.  Pulmonary - Encourage incentive spirometer.CXR this am shows no pneumothorax, bibasilar atelectasis and small pleural effusions,and some pulmonary vascular congestion. 3. Volume Overload - Continue with diuresis.Will give additional Lasix this afternoon. 4.  Acute blood loss anemia - H/H this am 11.2/34.3. 5.DM-CBGs 158/151/131. Re start Metformin and stop scheduled insulin. Pre op HGA1C 6.6. 6.Remove EPW. 7.LOC for constipation.  ZIMMERMAN,DONIELLE MPA-C 03/22/2011   I have seen and examined the patient and agree with the assessment and plan as outlined.  Purcell Nails 03/22/2011 7:36 PM

## 2011-03-22 NOTE — Progress Notes (Signed)
Patient CBG less than one 120 so no insulin needed. CBG= 119.

## 2011-03-22 NOTE — Progress Notes (Signed)
D/C'd EPW per MD order and hospital policy.  Tolerated well.  All ends intact.  Gauze applied because of little bleeding.  Bed rest for 1 hour.  Vitals started.  Will continue to monitor.

## 2011-03-22 NOTE — Progress Notes (Signed)
Patient ID: William Dyer, male   DOB: [birth year], 66 y.o.   MRN: [id number] @ Subjective:  Rough.  Coughing some SOB    Objective:  Filed Vitals:   03/21/11 2202 03/21/11 2311 03/22/11 0544 03/22/11 0552  BP:   131/79   Pulse: 91  93   Temp:   97.9 F (36.6 C)   TempSrc:   Oral   Resp:   20   Height:      Weight:   92 kg (202 lb 13.2 oz)   SpO2:  92% 88% 91%    Intake/Output from previous day:  Intake/Output Summary (Last 24 hours) at 03/22/11 0820 Last data filed at 03/21/11 2209  Gross per 24 hour  Intake    143 ml  Output   1475 ml  Net  -1332 ml    Physical Exam:  Chronically ill COPDer S/P sternotomy Rhonchi Distant heart sounds regular rate and rhythm BS positive soft Trace LE edema Plus 2 bilateral PT  Lab Results: Basic Metabolic Panel:  Basename 03/22/11 0558 03/21/11 0345 03/19/11 1640  NA 138 132* --  K 4.1 4.0 --  CL 96 97 --  CO2 36* 31 --  GLUCOSE 124* 146* --  BUN 16 21 --  CREATININE 0.75 0.65 --  CALCIUM 8.8 8.6 --  MG -- -- 2.1  PHOS -- -- --   Liver Function Tests: No results found for this basename: AST:2,ALT:2,ALKPHOS:2,BILITOT:2,PROT:2,ALBUMIN:2 in the last 72 hours No results found for this basename: LIPASE:2,AMYLASE:2 in the last 72 hours CBC:  Basename 03/22/11 0558 03/21/11 0345  WBC 9.9 14.2*  NEUTROABS -- --  HGB 11.2* 10.9*  HCT 34.3* 33.0*  MCV 96.1 95.7  PLT 171 123*    Imaging: Dg Chest 2 View  03/21/2011  *RADIOLOGY REPORT*  Clinical Data: Shortness of breath, post CABG  CHEST - 2 VIEW  Comparison: 03/20/2011  Findings: Right IJ sheath remains at least.  No pneumothorax. Small bilateral effusions are noted with improved bibasilar aeration but persistent atelectasis.  Moderate enlargement of the cardiomediastinal silhouette noted with evidence of CABG.  IMPRESSION: Small pleural effusions persist with bibasilar atelectasis but mildly improved basilar aeration.  Original Report Authenticated By: Harrel Lemon,  M.D.    Cardiac Studies:  ECG:  Orders placed during the hospital encounter of 03/14/11  . EKG 12-LEAD  . EKG 12-LEAD  . EKG     Telemetry: NSR no arrhythmia  Medications:     . acetaminophen  1,000 mg Oral Q6H   Or  . acetaminophen (TYLENOL) oral liquid 160 mg/5 mL  975 mg Per Tube Q6H  . aspirin EC  81 mg Oral Daily  . clopidogrel  75 mg Oral Q breakfast  . docusate sodium  200 mg Oral Daily  . Fluticasone-Salmeterol  1 puff Inhalation Q12H  . furosemide  40 mg Oral Daily  . furosemide  40 mg Oral Once  . guaiFENesin  600 mg Oral BID  . insulin aspart  0-24 Units Subcutaneous TID AC & HS  . lactulose  20 g Oral Once  . levalbuterol  2 puff Inhalation Q6H  . levalbuterol  1.25 mg Nebulization TID  . levothyroxine  125 mcg Oral QAC breakfast  . loratadine  10 mg Oral Daily  . LORazepam  1 mg Oral QHS  . metFORMIN  500 mg Oral BID WC  . metoprolol tartrate  25 mg Oral BID  . moving right along book   Does not apply Once  .  nicotine  21 mg Transdermal Daily  . pantoprazole  40 mg Oral QAC breakfast  . polyethylene glycol  17 g Oral Once  . potassium chloride  20 mEq Oral Daily  . potassium chloride  20 mEq Oral Once  . rosuvastatin  10 mg Oral q1800  . sodium chloride  3 mL Intravenous Q12H  . tiotropium  18 mcg Inhalation Daily  . DISCONTD: aspirin  324 mg Per Tube Daily  . DISCONTD: aspirin EC  325 mg Oral Daily  . DISCONTD: bisacodyl  10 mg Oral Daily  . DISCONTD: bisacodyl  10 mg Rectal Daily  . DISCONTD: docusate sodium  200 mg Oral Daily  . DISCONTD: furosemide  40 mg Oral Daily  . DISCONTD: insulin aspart  0-24 Units Subcutaneous Q4H  . DISCONTD: insulin glargine  24 Units Subcutaneous QHS  . DISCONTD: metoprolol tartrate  12.5 mg Per Tube BID  . DISCONTD: metoprolol tartrate  12.5 mg Oral BID  . DISCONTD: metoprolol tartrate  12.5 mg Oral BID  . DISCONTD: nicotine  21 mg Transdermal Daily  . DISCONTD: pantoprazole  40 mg Oral Q1200  . DISCONTD: potassium  chloride  20 mEq Oral Daily  . DISCONTD: rosuvastatin  20 mg Oral q1800  . DISCONTD: sodium chloride  3 mL Intravenous Q12H       . DISCONTD: sodium chloride Stopped (03/21/11 1600)  . DISCONTD: insulin (NOVOLIN-R) infusion Stopped (03/18/11 2000)    Assessment/Plan:  CABG:  ? Reason for Plavix  Pulmonary toilet  IS  Discussed need for continued smoking cessation Continue  21 mg patch  [name] 03/22/2011, 8:20 AM

## 2011-03-22 NOTE — Progress Notes (Signed)
Ambulated patient 300 ft with rolling walker, assist x 1.  Tolerated well. Returned to chair, vss.  Will continue to monitor.

## 2011-03-22 NOTE — Progress Notes (Signed)
CARDIAC REHAB PHASE I   PRE:  Rate/Rhythm: 92 SR    BP: sitting 118/68    SaO2: 86 3 1/2L  MODE:  Ambulation: 350 ft   POST:  Rate/Rhythm: 100 ST    BP: sitting 132/58     SaO2: 84-89 6L  Tolerated fair, assist x1 with RW. Steady. Sts some SOB. SaO2 84-89 on 6L walking. Sts he wears 2L at home. Left on 4L in room in recliner. Encouraged IS, which he sts he is doing.  [id number]  Harriet Masson CES, ACSM

## 2011-03-23 LAB — GLUCOSE, CAPILLARY
Glucose-Capillary: 119 mg/dL — ABNORMAL HIGH (ref 70–99)
Glucose-Capillary: 161 mg/dL — ABNORMAL HIGH (ref 70–99)

## 2011-03-23 MED ORDER — POTASSIUM CHLORIDE CRYS ER 20 MEQ PO TBCR: 20.0000 meq | EXTENDED_RELEASE_TABLET | Freq: Every day | ORAL | Status: DC

## 2011-03-23 MED ORDER — FUROSEMIDE 40 MG PO TABS
40.0000 mg | ORAL_TABLET | Freq: Once | ORAL | Status: AC
  Administered 2011-03-23: 40 mg via ORAL
  Filled 2011-03-23: qty 1

## 2011-03-23 MED ORDER — FLUTICASONE-SALMETEROL 250-50 MCG/DOSE IN AEPB: 1.0000 | INHALATION_SPRAY | Freq: Two times a day (BID) | RESPIRATORY_TRACT | Status: DC

## 2011-03-23 MED ORDER — METOPROLOL TARTRATE 25 MG PO TABS: 25.0000 mg | ORAL_TABLET | Freq: Two times a day (BID) | ORAL | Status: DC

## 2011-03-23 MED ORDER — TIOTROPIUM BROMIDE MONOHYDRATE 18 MCG IN CAPS: 18.0000 ug | ORAL_CAPSULE | Freq: Every day | RESPIRATORY_TRACT | Status: DC

## 2011-03-23 MED ORDER — CLOPIDOGREL BISULFATE 75 MG PO TABS: 75.0000 mg | ORAL_TABLET | Freq: Every day | ORAL | Status: DC

## 2011-03-23 MED ORDER — TRAMADOL HCL 50 MG PO TABS: 50.0000 mg | ORAL_TABLET | ORAL | Status: AC | PRN

## 2011-03-23 MED ORDER — POTASSIUM CHLORIDE CRYS ER 20 MEQ PO TBCR
20.0000 meq | EXTENDED_RELEASE_TABLET | Freq: Once | ORAL | Status: AC
  Administered 2011-03-23: 20 meq via ORAL
  Filled 2011-03-23: qty 1

## 2011-03-23 MED ORDER — LACTULOSE 10 GM/15ML PO SOLN
20.0000 g | Freq: Once | ORAL | Status: AC
  Administered 2011-03-23: 20 g via ORAL
  Filled 2011-03-23: qty 30

## 2011-03-23 MED ORDER — INSULIN ASPART 100 UNIT/ML ~~LOC~~ SOLN
0.0000 [IU] | SUBCUTANEOUS | Status: DC
  Administered 2011-03-23 – 2011-03-24 (×3): 3 [IU] via SUBCUTANEOUS

## 2011-03-23 MED ORDER — ALBUTEROL SULFATE HFA 108 (90 BASE) MCG/ACT IN AERS: 2.0000 | INHALATION_SPRAY | RESPIRATORY_TRACT | Status: DC | PRN

## 2011-03-23 MED ORDER — ROSUVASTATIN CALCIUM 10 MG PO TABS: 10.0000 mg | ORAL_TABLET | Freq: Every day | ORAL | Status: DC

## 2011-03-23 MED ORDER — NICOTINE 21 MG/24HR TD PT24: 1.0000 | MEDICATED_PATCH | Freq: Every day | TRANSDERMAL | Status: AC

## 2011-03-23 MED ORDER — FUROSEMIDE 40 MG PO TABS: 40.0000 mg | ORAL_TABLET | Freq: Every day | ORAL | Status: DC

## 2011-03-23 MED FILL — Lidocaine HCl IV Inj 20 MG/ML: INTRAVENOUS | Qty: 5 | Status: AC

## 2011-03-23 MED FILL — Heparin Sodium (Porcine) Inj 1000 Unit/ML: INTRAMUSCULAR | Qty: 10 | Status: AC

## 2011-03-23 MED FILL — Sodium Chloride IV Soln 0.9%: INTRAVENOUS | Qty: 1000 | Status: AC

## 2011-03-23 MED FILL — Mannitol IV Soln 20%: INTRAVENOUS | Qty: 500 | Status: AC

## 2011-03-23 MED FILL — Heparin Sodium (Porcine) Inj 1000 Unit/ML: INTRAMUSCULAR | Qty: 30 | Status: AC

## 2011-03-23 MED FILL — Sodium Chloride Irrigation Soln 0.9%: Qty: 3000 | Status: AC

## 2011-03-23 MED FILL — Sodium Bicarbonate IV Soln 8.4%: INTRAVENOUS | Qty: 50 | Status: AC

## 2011-03-23 MED FILL — Electrolyte-R (PH 7.4) Solution: INTRAVENOUS | Qty: 4000 | Status: AC

## 2011-03-23 NOTE — Discharge Summary (Signed)
Physician Discharge Summary  Patient ID: William Dyer MRN: [id number] DOB/AGE: 10-06-1945 66 y.o.  Admit date: 03/14/2011 Discharge date: 03/25/2011  Admission Diagnoses: 1.History of CAD (possible PCI) 2.History of DM 3.History of COPD 4.History of lung cancer 5.History of tobacco abuse 6.History of obesity 7.History of hypothyroidism 8.History of BPH  Discharge Diagnoses:  1.History of  CAD (possible PCI) 2.History of DM 3.History of COPD 4.History of lung cancer 5.History of tobacco abuse 6.History of obesity 7.History of hypothyroidism 8.History of BPH 9.Thrombocytopenia 10.Mild ABL anemia   Procedure (s): 1.Cardiac Catheterization done by Dr. Sanjuana Kava on 03/14/2011 Angiographic Findings:  Left main: No obstructive disease.  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 This is a long stenosis involving the proximal vessel and mid vessel. There is a large branch that is occluded and while it could represent a diagonal, it has an unusual distribution. 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 The distal vessel is free of disease.  Circumflex Artery: Moderate sized vessel with moderate sized OM branch. No obstructive disease.  Right Coronary Artery: Small, non-dominant vessel with 100% mid occlusion. Distal vessel fills from right to right bridging collaterals.  Left Ventricular Angiogram: LVEF 60-65%.  2.1. Coronary artery bypass grafting x3 (left internal mammary artery to  LAD, saphenous vein graft to diagonal, saphenous vein graft to  right coronary artery) with endoscopic harvest of right leg greater saphenous vein by Dr. Donata Clay on 03/18/2011.   History of Presenting Illness: This is a 66 year old Caucasian male, previously seen by Dr Riley Kill.He apparently had two arteries unplugged 20 years ago." He has a history of COPD/emphysema and his pulmonologist is Dr. Shelle Iron. His Spiriva was then stopped due to congestion and sinus issues. He has  chronic dyspnea, but was not on oxygen at home. He recently had a COPD exacerbation and did not react well to prednisone. He then had increasing  blood pressures and started having SSCP that radiated  to his left arm. He underwent a cardiac catheterization by Dr. Sanjuana Kava on 03/14/2011. Results indicated 5 vessel coronary artery disease with normal left and circular systolic function. Not a good candidate for PCI and a cardiothoracic consultation was obtained with Dr. Zenaida Niece to right for the consideration of coronary artery bypass grafting surgery. Potential risks, benefits, and complications of the surgery discussed with the patient and he agreed to proceed. He underwent a CABG x3 by Dr. Kate Sable on 03/18/2011.  Brief Hospital Course:  He was extubated later the evening of surgery without difficulty. He remained afebrile and hemodynamically stable. His Swan-Ganz, A-line, chest tubes and, and Foley were all removed early in his postoperative course. He was started on a low-dose beta blocker. As previously stated, he has a history of COPD/emphysema area he did require a couple liters of oxygen postoperatively and will require oxygen at  the time of discharge. He was weaned off his insulin drip. He was restarted on his oral diabetic medications with good glucose control. His preoperative hemoglobin A1c was 6.6. He was found to be volume overloaded and diuresed accordingly to found to have thrombocytopenia postoperatively. His platelet count went as low as 120,000. He did have resolution of his thrombocytopenia as his last platelet white count was up to 171,000. He was also found to have mild acute blood loss anemia. His H&H was low as 10.9 and 33. He did not require postoperative transfusion. His last H&H of 11.2 and 34.3 respectively. He remained in sinus rhythm. He  was felt surgically stable for transfer from the intensive care to [facility]. for further convalescence on 03/21/2011. He has been making steady progress  with cardiac rehabilitation. He has already been  tolerating a diet and  has had a bowel movement. Epicardial pacing wires have been removed. Chest tube sutures will be removed on the day of discharge. He has been diuresing very well over the last couple of days.Provided he remains afebrile, hemodynamically stable and pending morning round evaluation, and he will be surgically stable for discharge on 03/24/2011 .  Filed Vitals:   03/23/11 0359  BP: 105/56  Pulse: 87  Temp: 97.9 F (36.6 C)  Resp: 19     Latest Vital Signs: Blood pressure 105/56, pulse 87, temperature 97.9 F (36.6 C), temperature source Oral, resp. rate 19, height 5\' 4"  (1.626 m), weight 200 lb 9.9 oz (91 kg), SpO2 91.00%.  Physical Exam: Cardiovascular: RRR, no murmurs, gallops, or rubs.  Pulmonary: Diminished at bases; no rales, wheezes, or rhonchi.  Abdomen: Soft, non tender, distended,bowel sounds present.  Extremities: Bilateral lower extremity edema.  Wounds: Clean and dry. No erythema or signs of infection.   Discharge Condition:Stable  Recent laboratory studies:  Lab Results  Component Value Date   WBC 9.9 03/22/2011   HGB 11.2* 03/22/2011   HCT 34.3* 03/22/2011   MCV 96.1 03/22/2011   PLT 171 03/22/2011   Lab Results  Component Value Date   NA 138 03/22/2011   K 4.1 03/22/2011   CL 96 03/22/2011   CO2 36* 03/22/2011   CREATININE 0.75 03/22/2011   GLUCOSE 124* 03/22/2011      Diagnostic Studies: Dg Chest 2 View  03/22/2011  *RADIOLOGY REPORT*  Clinical Data: History of post CABG.  History of hypertension and diabetes.  Chest soreness.  CHEST - 2 VIEW  Comparison: 03/21/2011.  Findings: There has been interval removal of the right internal jugular venous sheath. Stable moderate cardiac silhouette enlargement. The patient has undergone previous median sternotomy and coronary artery bypass grafting.  Stable ectasia of thoracic aorta.  Stable bibasilar atelectasis with small pleural effusions. Mild vascular  congestion pattern.  Low lung volumes.  Minimal degenerative spondylosis.  IMPRESSION: Interval removal of right internal jugular venous sheath. No pneumothorax.  Stable cardiac silhouette enlargement.  Post CABG. Vascular congestion pattern with stable bilateral bibasilar atelectasis and small pleural effusions.  No new lesion evident.  Original Report Authenticated By: Crawford Givens, M.D.      Discharge Orders    Future Appointments: Provider: Department: Dept Phone: Center:   03/31/2011 10:00 AM Wendall Stade, MD [facility] [phone] [facility]   04/13/2011 10:30 AM Mikey Bussing, MD [facility] [phone] [facility]   06/21/2011 9:45 AM Barbaraann Share, MD [facility] [phone] None      Discharge Medications: Medication List  As of 03/23/2011 11:43 AM   STOP taking these medications               nitroGLYCERIN 0.4 MG SL tablet                     TAKE these medications         acetaminophen 500 MG tablet   Commonly known as: TYLENOL   Take 1,000 mg by mouth daily as needed. For pain      aspirin 81 MG tablet   Take 81 mg by mouth daily.      clopidogrel 75 MG tablet   Commonly known as:  PLAVIX   Take 1 tablet (75 mg total) by mouth daily with breakfast.      doxazosin 2 MG tablet   Commonly known as: CARDURA   Take 2 mg by mouth at bedtime.      Fluticasone-Salmeterol 250-50 MCG/DOSE Aepb   Commonly known as: ADVAIR   Inhale 1 puff into the lungs every 12 (twelve) hours.      furosemide 40 MG tablet   Commonly known as: LASIX   Take 1 tablet (40 mg total) by mouth daily. For 4 days then stop.      levothyroxine 125 MCG tablet   Commonly known as: SYNTHROID, LEVOTHROID   Take 125 mcg by mouth daily.      loratadine 10 MG tablet   Commonly known as: CLARITIN   Take 10 mg by mouth daily.      metFORMIN 500 MG (MOD) 24 hr tablet   Commonly known as: GLUMETZA   Take 500 mg by mouth 2 (two) times daily with a meal. 1 tablet twice a day        metoprolol tartrate 25 MG tablet   Commonly known as: LOPRESSOR   Take 1 tablet (25 mg total) by mouth 2 (two) times daily.      MUCINEX 600 MG 12 hr tablet   Generic drug: guaiFENesin   Take 600 mg by mouth 2 (two) times daily as needed. For congestion      nicotine 21 mg/24hr patch   Commonly known as: NICODERM CQ - dosed in mg/24 hours   Place 1 patch onto the skin daily.      potassium chloride SA 20 MEQ tablet   Commonly known as: K-DUR,KLOR-CON   Take 1 tablet (20 mEq total) by mouth daily. For 4 days then stop.      rosuvastatin 10 MG tablet   Commonly known as: CRESTOR   Take 1 tablet (10 mg total) by mouth daily at 6 PM.      tiotropium 18 MCG inhalation capsule   Commonly known as: SPIRIVA   Place 18 mcg into inhaler and inhale daily.      traMADol 50 MG tablet   Commonly known as: ULTRAM   Take 1-2 tablets (50-100 mg total) by mouth every 4 (four) hours as needed for pain.            Follow Up Appointments: Follow-up Information    Follow up with medical doctor. (Call for a follow up regarding further diabetes management)       Follow up with [name], MD. (Call for an appointment for 2 weeks)    Contact information:   [address] [place] [address] [phone]       Follow up with Mikey Bussing, MD. (PA/LAT CXR to be taken on 04/13/2011  at 9:30 am;Appointment with Dr. Donata Clay is on 04/13/2011  at 10:30 am)    Contact information:   [address] [place] [address] [phone]          Signed: Doree Fudge MPA-C 03/23/2011, 11:43 AM

## 2011-03-23 NOTE — Progress Notes (Signed)
Patient discussed at the Long Length of Stay William Dyer 03/23/2011  

## 2011-03-23 NOTE — Discharge Instructions (Signed)
Activity: 1.May walk up steps                2.No lifting more than ten pounds for four weeks.                 3.No driving for four weeks.                4.Stop any activity that causes chest pain, shortness of breath, dizziness, sweating or excessive weakness.                5.Avoid straining.                6.Continue with your breathing exercises daily.  Diet: Diabetic diet and Low fat, Low salt diet  Wound Care: May shower.  Clean wounds with mild soap and water daily. Contact the office at [phone] if any problems arise. Coronary Artery Bypass Grafting Care After Refer to this sheet in the next few weeks. These instructions provide you with information on caring for yourself after your procedure. Your caregiver may also give you more specific instructions. Your treatment has been planned according to current medical practices, but problems sometimes occur. Call your caregiver if you have any problems or questions after your procedure.  Recovery from open heart surgery will be different for everyone. Some people feel well after 3 or 4 weeks, while for others it takes longer. After heart surgery, it may be normal to:  Not have an appetite, feel nauseated by the smell of food, or only want to eat a small amount.   Be constipated because of changes in your diet, activity, and medicines. Eat foods high in fiber. Add fresh fruits and vegetables to your diet. Stool softeners may be helpful.   Feel sad or unhappy. You may be frustrated or cranky. You may have good days and bad days. Do not give up. Talk to your caregiver if you do not feel better.   Feel weakness and fatigue. You many need physical therapy or cardiac rehabilitation to get your strength back.   Develop an irregular heartbeat called atrial fibrillation. Symptoms of atrial fibrillation are a fast, irregular heartbeat or feelings of fluttery heartbeats, shortness of breath, low blood pressure, and dizziness. If these symptoms  develop, see your caregiver right away.  MEDICATION  Have a list of all the medicines you will be taking when you leave the hospital. For every medicine, know the following:   Name.   Exact dose.   Time of day to be taken.   How often it should be taken.   Why you are taking it.   Ask which medicines should or should not be taken together. If you take more than one heart medicine, ask if it is okay to take them together. Some heart medicines should not be taken at the same time because they may lower your blood pressure too much.   Narcotic pain medicine can cause constipation. Eat fresh fruits and vegetables. Add fiber to your diet. Stool softener medicine may help relieve constipation.   Keep a copy of your medicines with you at all times.   Do not add or stop taking any medicine until you check with your caregiver.   Medicines can have side effects. Call your caregiver who prescribed the medicine if you:   Start throwing up, have diarrhea, or have stomach pain.   Feel dizzy or lightheaded when you stand up.   Feel your heart is skipping beats or is beating   too fast or too slow.   Develop a rash.   Notice unusual bruising or bleeding.  HOME CARE INSTRUCTIONS  After heart surgery, it is important to learn how to take your pulse. Have your caregiver show you how to take your pulse.   Use your incentive spirometer. Ask your caregiver how long after surgery you need to use it.  Care of your chest incision  Tell your caregiver right away if you notice clicking in your chest (sternum).   Support your chest with a pillow or your arms when you take deep breaths and cough.   Follow your caregiver's instructions about when you can bathe or swim.   Protect your incision from sunlight during the first year to keep the scar from getting dark.   Tell your caregiver if you notice:   Increased tenderness of your incision.   Increased redness or swelling around your incision.    Drainage or pus from your incision.  Care of your leg incision(s)  Avoid crossing your legs.   Avoid sitting for long periods of time. Change positions every half hour.   Elevate your leg(s) when you are sitting.   Check your leg(s) daily for swelling. Check the incisions for redness or drainage.   Wear your elastic stockings as told by your caregiver. Take them off at bedtime.  Diet  Diet is very important to heart health.   Eat plenty of fresh fruits and vegetables. Meats should be lean cut. Avoid canned, processed, and fried foods.   Talk to a dietician. They can teach you how to make healthy food and drink choices.  Weight  Weigh yourself every day. This is important because it helps to know if you are retaining fluid that may make your heart and lungs work harder.   Use the same scale each time.   Weigh yourself every morning at the same time. You should do this after you go to the bathroom, but before you eat breakfast.   Your weight will be more accurate if you do not wear any clothes.   Record your weight.   Tell your caregiver if you have gained 2 pounds or more overnight.  Activity Stop any activity at once if you have chest pain, shortness of breath, irregular heartbeats, or dizziness. Get help right away if you have any of these symptoms.  Bathing.  Avoid soaking in a bath or hot tub until your incisions are healed.   Rest. You need a balance of rest and activity.   Exercise. Exercise per your caregiver's advice. You may need physical therapy or cardiac rehabilitation to help strengthen your muscles and build your endurance.   Climbing stairs. Unless your caregiver tells you not to climb stairs, go up stairs slowly and rest if you tire. Do not pull yourself up by the handrail.   Driving a car. Follow your caregiver's advice on when you may drive. You may ride as a passenger at any time. When traveling for long periods of time in a car, get out of the car and  walk around for a few minutes every 2 hours.   Lifting. Avoid lifting, pushing, or pulling anything heavier than 10 pounds for 6 weeks after surgery or as told by your caregiver.   Returning to work. Check with your caregiver. People heal at different rates. Most people will be able to go back to work 6 to 12 weeks after surgery.   Sexual activity. You may resume sexual relations as told   by your caregiver.  SEEK MEDICAL CARE IF:  Any of your incisions are red, painful, or have any type of drainage coming from them.   You have an oral temperature above 102 F (38.9 C).   You have ankle or leg swelling.   You have pain in your legs.   You have weight gain of 2 or more pounds a day.   You feel dizzy or lightheaded when you stand up.  SEEK IMMEDIATE MEDICAL CARE IF:  You have angina or chest pain that goes to your jaw or arms. Call your local emergency services right away.   You have shortness of breath at rest or with activity.   You have a fast or irregular heartbeat (arrhythmia).   There is a "clicking" in your sternum when you move.   You have numbness or weakness in your arms or legs.  MAKE SURE YOU:  Understand these instructions.   Will watch your condition.   Will get help right away if you are not doing well or get worse.  Document Released: 08/13/2004 Document Revised: 10/06/2010 Document Reviewed: 03/31/2010 Exit[employer] Patient Information 2012 [employer].    

## 2011-03-23 NOTE — Progress Notes (Signed)
CARDIAC REHAB PHASE I   PRE:  Rate/Rhythm: 85SR  BP:  Supine:   Sitting: 94/60  Standing:    SaO2: 89%4L  MODE:  Ambulation: 550 ft   POST:  Rate/Rhythem: 96SR  BP:  Supine:   Sitting: 130/80  Standing:    SaO2: 85-89-90%6L 1100-1126 Pt walked 550 ft on oxygen at 6L with rolling walker and asst x 1. Checked sats several times during walk and 85%on6L. Sats to 89-90%6L with rest standing. To recliner after walk. Tolerated well except desat.  William Dyer

## 2011-03-23 NOTE — Progress Notes (Signed)
2 units given for cbg of 141 per insulin scale in nursing orders.

## 2011-03-23 NOTE — Progress Notes (Signed)
Patient ambulated 250 ft assist x 1.  Tolerated well.  Needed 6L of O2 while walking. Sats were 90%.  Will continue to monitor.

## 2011-03-23 NOTE — Progress Notes (Signed)
Patient has refused to have a new IV placed, MD aware.  Ok to leave in.  Will continue to monitor.

## 2011-03-23 NOTE — Progress Notes (Signed)
5 Days Post-Op Procedure(s) (LRB): CORONARY ARTERY BYPASS GRAFTING (CABG) (N/A)  Subjective: Patient with small bowel movement; "self disimpacted" yesterday.  Objective: Vital signs in last 24 hours: Patient Vitals for the past 24 hrs:  BP Temp Temp src Pulse Resp SpO2 Weight  03/23/11 0359 105/56 mmHg 97.9 F (36.6 C) Oral 87  19  91 % 200 lb 9.9 oz (91 kg)  03/22/11 2206 103/57 mmHg - - 94  - - -  03/22/11 2033 97/53 mmHg 98.2 F (36.8 C) Oral 91  19  90 % -  03/22/11 1458 112/68 mmHg 97.4 F (36.3 C) Oral 83  18  93 % -  03/22/11 1112 124/70 mmHg - - 88  19  - -  03/22/11 1100 131/78 mmHg - - 85  18  - -  03/22/11 1030 130/80 mmHg - - 91  20  - -  03/22/11 0915 136/78 mmHg - - 90  20  - -  03/22/11 0900 131/70 mmHg - - 89  19  - -   Pre op weight  88 kg Current Weight  03/23/11 200 lb 9.9 oz (91 kg)       Intake/Output from previous day: 02/12 0701 - 02/13 0700 In: 240 [P.O.:240] Out: 2126 [Urine:2125; Stool:1]   Physical Exam:  Cardiovascular: RRR, no murmurs, gallops, or rubs. Pulmonary: Diminished at bases; no rales, wheezes, or rhonchi. Abdomen: Soft, non tender, distended,bowel sounds present. Extremities: Bilateral lower extremity edema. Wounds: Clean and dry.  No erythema or signs of infection.  Lab Results: CBC:  Basename 03/22/11 0558 03/21/11 0345  WBC 9.9 14.2*  HGB 11.2* 10.9*  HCT 34.3* 33.0*  PLT 171 123*   BMET:   Basename 03/22/11 0558 03/21/11 0345  NA 138 132*  K 4.1 4.0  CL 96 97  CO2 36* 31  GLUCOSE 124* 146*  BUN 16 21  CREATININE 0.75 0.65  CALCIUM 8.8 8.6    PT/INR: No results found for this basename: LABPROT,INR in the last 72 hours ABG:  INR: Will add last result for INR, ABG once components are confirmed Will add last 4 CBG results once components are confirmed  Assessment/Plan:  1. CV - SR.Continue  Lopressor to 25 bid and continue Plavix. 2.  Pulmonary - Encourage incentive spirometer. Wean O2 as tolerates.Will  likely need home O2 as with a history of COPD. 3. Volume Overload - Continue with diuresis.Will give additional Lasix this afternoon. 4.  Acute blood loss anemia - H/H this am 11.2/34.3. 5.DM-CBGs 156/119/141 . Re start Metformin and stop scheduled insulin. Pre op HGA1C 6.6. 6.LOC for constipation.     Ardelle Balls PA-C 03/23/2011 7:43 AM

## 2011-03-24 DIAGNOSIS — IMO0001 Reserved for inherently not codable concepts without codable children: Secondary | ICD-10-CM

## 2011-03-24 DIAGNOSIS — E1165 Type 2 diabetes mellitus with hyperglycemia: Secondary | ICD-10-CM

## 2011-03-24 LAB — GLUCOSE, CAPILLARY
Glucose-Capillary: 172 mg/dL — ABNORMAL HIGH (ref 70–99)
Glucose-Capillary: 140 mg/dL — ABNORMAL HIGH (ref 70–99)
Glucose-Capillary: 138 mg/dL — ABNORMAL HIGH (ref 70–99)

## 2011-03-24 LAB — BASIC METABOLIC PANEL
BUN: 17 mg/dL (ref 6–23)
CO2: 37 mEq/L — ABNORMAL HIGH (ref 19–32)
Calcium: 10 mg/dL (ref 8.4–10.5)
Chloride: 91 mEq/L — ABNORMAL LOW (ref 96–112)
Creatinine, Ser: 0.86 mg/dL (ref 0.50–1.35)

## 2011-03-24 MED ORDER — POTASSIUM CHLORIDE CRYS ER 20 MEQ PO TBCR
20.0000 meq | EXTENDED_RELEASE_TABLET | Freq: Once | ORAL | Status: AC
  Administered 2011-03-24: 20 meq via ORAL
  Filled 2011-03-24 (×2): qty 1

## 2011-03-24 MED ORDER — FUROSEMIDE 40 MG PO TABS
40.0000 mg | ORAL_TABLET | Freq: Once | ORAL | Status: AC
  Administered 2011-03-24: 40 mg via ORAL
  Filled 2011-03-24: qty 1

## 2011-03-24 MED ORDER — INSULIN ASPART 100 UNIT/ML ~~LOC~~ SOLN: 0.0000 [IU] | Freq: Every day | SUBCUTANEOUS | Status: DC

## 2011-03-24 MED ORDER — INSULIN ASPART 100 UNIT/ML ~~LOC~~ SOLN
0.0000 [IU] | Freq: Three times a day (TID) | SUBCUTANEOUS | Status: DC
  Administered 2011-03-24: 4 [IU] via SUBCUTANEOUS
  Administered 2011-03-24: 3 [IU] via SUBCUTANEOUS
  Administered 2011-03-24: 2 [IU] via SUBCUTANEOUS
  Administered 2011-03-25: 4 [IU] via SUBCUTANEOUS

## 2011-03-24 NOTE — Progress Notes (Addendum)
6 Days Post-Op Procedure(s) (LRB): CORONARY ARTERY BYPASS GRAFTING (CABG) (N/A)  Subjective: Patient with some incisional pain to left of sternal incision. Patient with desaturation (O2 into 85%) with ambulation yesterday.  Objective: Vital signs in last 24 hours: Patient Vitals for the past 24 hrs:  BP Temp Temp src Pulse Resp SpO2 Weight  03/24/11 0823 121/74 mmHg 98 F (36.7 C) Oral 97  20  91 % -  03/24/11 0637 - - - - - - 195 lb 1.7 oz (88.5 kg)  03/24/11 0408 116/71 mmHg 97.3 F (36.3 C) Oral 94  20  90 % -  03/23/11 2010 - - - - - 95 % -  03/23/11 1900 149/78 mmHg 97 F (36.1 C) Oral 98  20  - -  03/23/11 1332 122/70 mmHg 97.5 F (36.4 C) Oral 87  19  91 % -   Pre op weight  88 kg Current Weight  03/24/11 195 lb 1.7 oz (88.5 kg)       Intake/Output from previous day: 02/13 0701 - 02/14 0700 In: 960 [P.O.:960] Out: 1800 [Urine:1800]   Physical Exam:  Cardiovascular: RRR, no murmurs, gallops, or rubs. Pulmonary: Diminished at bases; no rales, wheezes, or rhonchi. Abdomen: Soft, non tender, distended,bowel sounds present. Extremities: Bilateral lower extremity edema, but is decreasing. Wounds: Clean and dry.  No erythema or signs of infection.  Lab Results: CBC:  Basename 03/22/11 0558  WBC 9.9  HGB 11.2*  HCT 34.3*  PLT 171   BMET:   Basename 03/24/11 0605 03/22/11 0558  NA 137 138  K 4.2 4.1  CL 91* 96  CO2 37* 36*  GLUCOSE 137* 124*  BUN 17 16  CREATININE 0.86 0.75  CALCIUM 10.0 8.8    PT/INR: No results found for this basename: LABPROT,INR in the last 72 hours ABG:  INR: Will add last result for INR, ABG once components are confirmed Will add last 4 CBG results once components are confirmed  Assessment/Plan:  1. CV - SR.Continue  Lopressor to 25 bid and continue Plavix. 2.  Pulmonary - Encourage incentive spirometer.Will need home O2 as with a history of COPD. 3. Volume Overload - Has diuresed well. Will continue for a few days upon  discharge. 4.  Acute blood loss anemia - H/H this am 11.2/34.3. 5.DM-CBGs 159/172/140.COntinue Metformin.Pre op HGA1C 6.6. 6.Discharge in am.      Ardelle Balls PA-C 03/24/2011 8:43 AM   patient examined and medical record reviewed,agree with above note. VAN TRIGT III,Kevyn Boquet 03/24/2011

## 2011-03-24 NOTE — Progress Notes (Signed)
   CARE MANAGEMENT NOTE 03/24/2011  Patient:  William Dyer, William Dyer   Account Number:  [id number]  Date Initiated:  03/18/2011  Documentation initiated by:  [facility]  Subjective/Objective Assessment:   CABG x3 on 03-18-11.  Has spouse.     Action/Plan:   PTA, PT INDEPENDENT, LIVES WITH SPOUSE.  PT HAS HOME O2 THROUGH [facility]; STATES HE HAS USED FOR YEARS, MOSTLY AT NIGHT.   Anticipated DC Date:  03/25/2011   Anticipated DC Plan:  HOME W HOME HEALTH SERVICES      DC Planning Services  CM consult      [facility] Choice  HOME HEALTH   Choice offered to / List presented to:  C-1 Patient        HH arranged  HH-1 William Dyer      [facility] agency  [facility]   Status of service:  In process, will continue to follow Medicare Important Message given?   (If response is "NO", the following Medicare IM given date fields will be blank) Date Medicare IM given:   Date Additional Medicare IM given:    Discharge Disposition:  HOME W HOME HEALTH SERVICES  Per UR Regulation:  Reviewed for med. necessity/level of care/duration of stay  Comments:  03/24/11 William Lukasiewicz,William Dyer,William Dyer 1040 MET WITH PT TO DISCUSS DC PLANS.  WIFE AND DAUGHTER TO PROVIDE CARE AT DC.  WOULD BENEFIT FROM [facility] FOR RESTORATIVE CARE AT DISCHARGE.  WILL ARRANGE HOME HEALTH CARE; PT William Dyer [facility] FOR HOME HEALTH CARE.  REFERRAL TO [facility] PER PROTOCOL. Phone #[phone]

## 2011-03-24 NOTE — Discharge Summary (Signed)
patient examined and medical record reviewed,agree with above note. VAN TRIGT III,Matt Delpizzo 03/24/2011

## 2011-03-24 NOTE — Progress Notes (Signed)
CARDIAC REHAB PHASE I   PRE:  Rate/Rhythm: 89 SR  BP:  Supine:   Sitting: 120/60  Standing:    SaO2: 91 4L  MODE:  Ambulation: 550 ft   POST:  Rate/Rhythem: 94  BP:  Supine:   Sitting: 122/70  Standing:    SaO2: 88-89 4l during walk  91 4L after walk 1135-1200  Assisted X 1 and used O2 4L to ambulate. Gait steady without walker. O2 sat during walk 88-89% on 4L after 91% on 4L. Walked 550 ft with couple rest stops, standing ones. To side of bed after walk with call light in reach.  Beatrix Fetters

## 2011-03-25 MED ORDER — POTASSIUM CHLORIDE CRYS ER 20 MEQ PO TBCR: 20.0000 meq | EXTENDED_RELEASE_TABLET | Freq: Every day | ORAL | Status: DC

## 2011-03-25 MED ORDER — FUROSEMIDE 40 MG PO TABS: 40.0000 mg | ORAL_TABLET | Freq: Every day | ORAL | Status: DC

## 2011-03-25 NOTE — Progress Notes (Signed)
7 Days Post-Op Procedure(s) (LRB): CORONARY ARTERY BYPASS GRAFTING (CABG) (N/A)  Subjective: Patient eating breakfast this am. Has some shortness of breath with exertion.  Objective: Vital signs in last 24 hours: Patient Vitals for the past 24 hrs:  BP Temp Temp src Pulse Resp SpO2  03/25/11 0401 114/71 mmHg 98.4 F (36.9 C) Oral 89  22  90 %  03/24/11 1950 126/74 mmHg 98 F (36.7 C) Oral 93  20  97 %  03/24/11 1916 - - - - - 90 %  03/24/11 1438 117/74 mmHg 97.1 F (36.2 C) Oral 88  22  93 %  03/24/11 1417 100/43 mmHg - - 108  22  93 %  03/24/11 1009 121/74 mmHg - - 97  - -  03/24/11 0849 120/76 mmHg - - 96  20  -  03/24/11 0823 121/74 mmHg 98 F (36.7 C) Oral 97  20  91 %   Pre op weight  88 kg Current Weight  03/24/11 195 lb 1.7 oz (88.5 kg)       Intake/Output from previous day: 02/14 0701 - 02/15 0700 In: 1680 [P.O.:1680] Out: 3025 [Urine:3025]   Physical Exam:  Cardiovascular: RRR, no murmurs, gallops, or rubs. Pulmonary: Diminished at bases; no rales, wheezes, or rhonchi. Abdomen: Soft, non tender, distended,bowel sounds present. Extremities: Bilateral lower extremity edema, but is decreasing. Wounds: Clean and dry.  No erythema or signs of infection.  Lab Results: CBC: No results found for this basename: WBC:2,HGB:2,HCT:2,PLT:2 in the last 72 hours BMET:   [facility] 03/24/11 0605  NA 137  K 4.2  CL 91*  CO2 37*  GLUCOSE 137*  BUN 17  CREATININE 0.86  CALCIUM 10.0    PT/INR: No results found for this basename: LABPROT,INR in the last 72 hours ABG:  INR: Will add last result for INR, ABG once components are confirmed Will add last 4 CBG results once components are confirmed  Assessment/Plan:  1. CV - SR.Continue  Lopressor to 25 bid and continue Plavix. 2.  Pulmonary - Encourage incentive spirometer.Will need home O2 as with a history of COPD. 3. Volume Overload - Has diuresed well. Will continue for a few days upon discharge. 4.  Acute blood  loss anemia - H/H this am 11.2/34.3. 5.DM-CBGs 160/123/154.Continue Metformin.Pre op HGA1C 6.6. 6.Discharge today.      Ardelle Balls PA-C 03/25/2011 7:53 AM   patient examined and medical record reviewed,agree with above note. Ardelle Balls 03/25/2011

## 2011-03-25 NOTE — Progress Notes (Signed)
CARDIAC REHAB PHASE I  Pt preparing to be d/c today. Enc use of IS and  D/c  teaching done with verbal understanding. Ok for phase II .   Rosalie Doctor

## 2011-03-25 NOTE — Progress Notes (Signed)
   CARE MANAGEMENT NOTE 03/25/2011  Patient:  BENOIT, MEECH   Account Number:  [id number]  Date Initiated:  03/18/2011  Documentation initiated by:  [facility]  Subjective/Objective Assessment:   CABG x3 on 03-18-11.  Has spouse.     Action/Plan:   PTA, PT INDEPENDENT, LIVES WITH SPOUSE.  PT HAS HOME O2 THROUGH [facility]; STATES HE HAS USED FOR YEARS, MOSTLY AT NIGHT.   Anticipated DC Date:  03/25/2011   Anticipated DC Plan:  HOME W HOME HEALTH SERVICES      DC Planning Services  CM consult      [facility] Choice  HOME HEALTH   Choice offered to / List presented to:  C-1 Patient        HH arranged  HH-1 RN      [facility] agency  [facility]   Status of service:  Completed, signed off Medicare Important Message given?   (If response is "NO", the following Medicare IM given date fields will be blank) Date Medicare IM given:   Date Additional Medicare IM given:    Discharge Disposition:  HOME W HOME HEALTH SERVICES  Per UR Regulation:  Reviewed for med. necessity/level of care/duration of stay  Comments:  03/25/11 Yaniel Limbaugh,RN,BSN PT DISCHARGED TO HOME TODAY.  NOTIFIED [facility] OF DC DATE. Phone #[phone]   03/24/11 Rylin Seavey,RN,BSN 1040 MET WITH PT TO DISCUSS DC PLANS.  WIFE AND DAUGHTER TO PROVIDE CARE AT DC.  WOULD BENEFIT FROM [facility] FOR RESTORATIVE CARE AT DISCHARGE.  WILL ARRANGE HOME HEALTH CARE; PT Claris Che [facility] FOR HOME HEALTH CARE.  REFERRAL TO [facility] PER PROTOCOL. Phone #[phone]

## 2011-03-31 ENCOUNTER — Ambulatory Visit (INDEPENDENT_AMBULATORY_CARE_PROVIDER_SITE_OTHER): Payer: Medicare Other | Admitting: Cardiovascular Disease

## 2011-03-31 ENCOUNTER — Encounter: Payer: Self-pay | Admitting: Cardiovascular Disease

## 2011-03-31 VITALS — BP 110/70 | HR 82 | Wt 197.0 lb

## 2011-03-31 DIAGNOSIS — E785 Hyperlipidemia, unspecified: Secondary | ICD-10-CM

## 2011-03-31 DIAGNOSIS — I2510 Atherosclerotic heart disease of native coronary artery without angina pectoris: Secondary | ICD-10-CM

## 2011-03-31 DIAGNOSIS — J438 Other emphysema: Secondary | ICD-10-CM

## 2011-03-31 DIAGNOSIS — Z951 Presence of aortocoronary bypass graft: Secondary | ICD-10-CM

## 2011-03-31 NOTE — Progress Notes (Signed)
F/U post hospitalization for CABG:  Course complicated by severe COPD and smoking.  D/C on oxygen Has F/U with Dr Shelle Iron and PVT.  Not sure why he is on Plavix. Asked him to discuss with PVT stopping this and starting ASA.  Has been intolerant to all statins in past.  D/C with crestor  Will take qod if pain in hands and myalgias return  Wounds healing well  Procedure (s): 1.Cardiac Catheterization done by Dr. Sanjuana Kava on 03/14/2011  Angiographic Findings:  Left main: No obstructive disease.  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 This is a long stenosis involving the proximal vessel and mid vessel. There is a large branch that is occluded and while it could represent a diagonal, it has an unusual distribution. 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 The distal vessel is free of disease.  Circumflex Artery: Moderate sized vessel with moderate sized OM branch. No obstructive disease.  Right Coronary Artery: Small, non-dominant vessel with 100% mid occlusion. Distal vessel fills from right to right bridging collaterals.  Left Ventricular Angiogram: LVEF 60-65%.  2.1. Coronary artery bypass grafting x3 (left internal mammary artery to  LAD, saphenous vein graft to diagonal, saphenous vein graft to  right coronary artery) with endoscopic harvest of right leg greater saphenous vein by Dr. Donata Clay on 03/18/2011.   ROS: Denies fever, malais, weight loss, blurry vision, decreased visual acuity, cough, sputum, SOB, hemoptysis, pleuritic pain, palpitaitons, heartburn, abdominal pain, melena, lower extremity edema, claudication, or rash.  All other systems reviewed and negative  General: Affect appropriate Chronically ill COPDer HEENT: normal Neck supple with no adenopathy JVP normal no bruits no thyromegaly Lungs clear with no wheezing and good diaphragmatic motion Heart:  S1/S2 no murmur, no rub, gallop or click S/P sternotomy PMI normal Abdomen: benighn, BS positve,  no tenderness, no AAA no bruit.  No HSM or HJR Distal pulses intact with no bruits Trace RLE edema S/P vein harvest endoscopically Neuro non-focal Skin warm and dry No muscular weakness   Current Outpatient Prescriptions  Medication Sig Dispense Refill  . acetaminophen (TYLENOL) 500 MG tablet Take 1,000 mg by mouth daily as needed. For pain      . albuterol (PROVENTIL HFA) 108 (90 BASE) MCG/ACT inhaler Inhale 2 puffs into the lungs every 4 (four) hours as needed for wheezing or shortness of breath.  3.7 Inhaler  1  . clopidogrel (PLAVIX) 75 MG tablet Take 1 tablet (75 mg total) by mouth daily with breakfast.  30 tablet  1  . Fluticasone-Salmeterol (ADVAIR DISKUS) 250-50 MCG/DOSE AEPB Inhale 1 puff into the lungs 2 (two) times daily.  1 each  5  . furosemide (LASIX) 40 MG tablet Take 1 tablet (40 mg total) by mouth daily. For 7 days then stop.  7 tablet  0  . guaiFENesin (MUCINEX) 600 MG 12 hr tablet Take 600 mg by mouth 2 (two) times daily as needed. For congestion      . levothyroxine (SYNTHROID, LEVOTHROID) 125 MCG tablet Take 125 mcg by mouth daily.        Marland Kitchen loratadine (CLARITIN) 10 MG tablet Take 10 mg by mouth daily.      . metFORMIN (GLUMETZA) 500 MG (MOD) 24 hr tablet Take 500 mg by mouth 2 (two) times daily with a meal. 1 tablet twice a day      . metoprolol tartrate (LOPRESSOR) 25 MG tablet Take 1 tablet (25 mg total) by mouth 2 (two) times daily.  60 tablet  1  . nicotine (NICODERM CQ - DOSED IN MG/24 HOURS) 21 mg/24hr patch Place 1 patch onto the skin daily.  28 patch  1  . potassium chloride SA (K-DUR,KLOR-CON) 20 MEQ tablet Take 1 tablet (20 mEq total) by mouth daily. For 7 days then stop.  7 tablet  0  . rosuvastatin (CRESTOR) 10 MG tablet Take 1 tablet (10 mg total) by mouth daily at 6 PM.  30 tablet  1  . tiotropium (SPIRIVA HANDIHALER) 18 MCG inhalation capsule Place 1 capsule (18 mcg total) into inhaler and inhale daily.  30 capsule  2  . traMADol (ULTRAM) 50 MG tablet Take  1-2 tablets (50-100 mg total) by mouth every 4 (four) hours as needed for pain.  45 tablet  0    Allergies  Sulfonamide derivatives  Electrocardiogram:  Assessment and Plan

## 2011-03-31 NOTE — Patient Instructions (Addendum)
Your physician wants you to follow-up in: 3 MONTHS WITH DR Haywood Filler will receive a reminder letter in the mail two months in advance. If you don't receive a letter, please call our office to schedule the follow-up appointment. Your physician recommends that you continue on your current medications as directed. Please refer to the Current Medication list given to you today. A chest x-ray takes a picture of the organs and structures inside the chest, including the heart, lungs, and blood vessels. This test can show several things, including, whether the heart is enlarges; whether fluid is building up in the lungs; and whether pacemaker / defibrillator leads are still in place. DX S/P CABG  04-08-11

## 2011-03-31 NOTE — Assessment & Plan Note (Signed)
Continue low dose crestor as long as he tolerates.  May be a candidate for Accelerate trial if he cant take statin

## 2011-03-31 NOTE — Assessment & Plan Note (Signed)
Wounds healing well.  F/U PVT  ? Stop Plavix and start ASA

## 2011-03-31 NOTE — Assessment & Plan Note (Signed)
F/U Clance 3/1.  Had oxygen before surgery but didn't use it much.  Continue 21 mg patch and decrease to 14 mg in 2 weeks

## 2011-04-08 ENCOUNTER — Ambulatory Visit (INDEPENDENT_AMBULATORY_CARE_PROVIDER_SITE_OTHER): Payer: Medicare Other | Admitting: Pulmonary Disease

## 2011-04-08 ENCOUNTER — Ambulatory Visit (INDEPENDENT_AMBULATORY_CARE_PROVIDER_SITE_OTHER)
Admission: RE | Admit: 2011-04-08 | Discharge: 2011-04-08 | Disposition: A | Discharge status: Home / Self Care | Payer: Medicare Other | Source: Ambulatory Visit | Attending: Cardiovascular Disease | Admitting: Cardiovascular Disease

## 2011-04-08 ENCOUNTER — Encounter: Payer: Self-pay | Admitting: Pulmonary Disease

## 2011-04-08 VITALS — BP 142/80 | HR 76 | Temp 97.7°F | Ht 64.0 in | Wt 201.0 lb

## 2011-04-08 DIAGNOSIS — Z951 Presence of aortocoronary bypass graft: Secondary | ICD-10-CM

## 2011-04-08 DIAGNOSIS — J438 Other emphysema: Secondary | ICD-10-CM

## 2011-04-08 NOTE — Progress Notes (Signed)
  Subjective:    Patient ID: William Dyer, male    DOB: 07-04-1945, 66 y.o.   MRN: [id number]  HPI The patient comes in today for followup of his known emphysema.  He has had recent cardiac bypass surgery, and has not smoked in 25 days.  He feels that his breathing is much improved, and denies any cough or congestion.  He is staying on his bronchodilator regimen, and is also wearing oxygen at h.s. And while awake.   He has an upcoming appointment with his cardiac surgeon next week  Review of Systems  Constitutional: Negative for fever and unexpected weight change.  HENT: Negative for ear pain, nosebleeds, congestion, sore throat, rhinorrhea, sneezing, trouble swallowing, dental problem, postnasal drip and sinus pressure.   Eyes: Negative for redness and itching.  Respiratory: Positive for cough. Negative for chest tightness, shortness of breath and wheezing.   Cardiovascular: Positive for chest pain and leg swelling. Negative for palpitations.  Gastrointestinal: Negative for nausea and vomiting.  Genitourinary: Negative for dysuria.  Musculoskeletal: Negative for joint swelling.  Skin: Negative for rash.  Neurological: Negative for headaches.  Hematological: Bruises/bleeds easily.  Psychiatric/Behavioral: Negative for dysphoric mood. The patient is not nervous/anxious.        Objective:   Physical Exam Obese male in no acute distress Nose without purulence or discharge noted Chest with adequate air flow, no wheezes or rhonchi Cardiac exam with regular rate and rhythm Lower extremities with mild ankle edema, no cyanosis Alert and oriented, moves all 4 extremities.       Assessment & Plan:

## 2011-04-08 NOTE — Assessment & Plan Note (Signed)
The pt is doing well from a breathing standpoint after CABG.  I suspect that his smoking cessation is playing a big role in this.  I have asked him to stay on his current BD, and to continue staying off cigs.  He will also benefit from cardiac rehab.

## 2011-04-08 NOTE — Patient Instructions (Addendum)
No change in breathing medications Continue staying away from cigarettes.  You are doing great! Stay active  followup with me in 4mos You do not need oxygen at rest when awake, but continue one liter at night while sleeping and 2 liters with sustained activity. Will send an order for you to get a humidity bottle on your concentrator.

## 2011-04-12 ENCOUNTER — Other Ambulatory Visit: Payer: Self-pay | Admitting: Cardiothoracic Surgery

## 2011-04-12 DIAGNOSIS — I2510 Atherosclerotic heart disease of native coronary artery without angina pectoris: Secondary | ICD-10-CM

## 2011-04-13 ENCOUNTER — Ambulatory Visit
Admission: RE | Admit: 2011-04-13 | Discharge: 2011-04-13 | Disposition: A | Discharge status: Home / Self Care | Payer: Medicare Other | Source: Ambulatory Visit | Attending: Cardiothoracic Surgery | Admitting: Cardiothoracic Surgery

## 2011-04-13 ENCOUNTER — Ambulatory Visit (INDEPENDENT_AMBULATORY_CARE_PROVIDER_SITE_OTHER): Payer: Self-pay | Admitting: Cardiothoracic Surgery

## 2011-04-13 ENCOUNTER — Encounter: Payer: Self-pay | Admitting: Cardiothoracic Surgery

## 2011-04-13 VITALS — BP 127/75 | HR 86 | Resp 20 | Ht 64.0 in | Wt 202.0 lb

## 2011-04-13 DIAGNOSIS — Z951 Presence of aortocoronary bypass graft: Secondary | ICD-10-CM

## 2011-04-13 DIAGNOSIS — I2510 Atherosclerotic heart disease of native coronary artery without angina pectoris: Secondary | ICD-10-CM

## 2011-04-13 DIAGNOSIS — J439 Emphysema, unspecified: Secondary | ICD-10-CM

## 2011-04-13 DIAGNOSIS — J438 Other emphysema: Secondary | ICD-10-CM

## 2011-04-13 NOTE — Patient Instructions (Signed)
U. may drive You may lift up to 20 pounds Continue complete smoking cessation Discontinue Plavix after current prescription is out and start taking aspirin one tablet 325 mg daily

## 2011-04-13 NOTE — Progress Notes (Signed)
PCP is [name], MD, MD Referring Provider is Wendall Stade, MD  Chief Complaint  Patient presents with  . 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 Since returning home his had no recurrent angina and the incisions are healing well. He is been successful in completely stopping smoking. He is anxious to increase his activity level and to drive.    Past Medical History  Diagnosis Date  . Emphysema   . Hyperlipidemia   . CAD (coronary artery disease)   . Lung cancer 07/2000    status post right lower lobectomy for adenocarcinoma  . Heart murmur   . Angina   . COPD (chronic obstructive pulmonary disease)   . Pneumonia   . Chronic bronchitis     "mostly in the winter time"  . Shortness of breath on exertion   . Hypothyroidism   . Diabetes mellitus   . GERD (gastroesophageal reflux disease)     Past Surgical History  Procedure Date  . Inguinal hernia repair 1960's    "? side"  . Tonsillectomy and adenoidectomy 1950's  . Lung lobectomy     right, lower; "for lung cancer"  . Coronary angioplasty 1990's  . Coronary artery bypass graft 03/18/2011    Procedure: CORONARY ARTERY BYPASS GRAFTING (CABG);  Surgeon: Kathlee Nations Suann Larry, MD;  Location: [facility] OR;  Service: Open Heart Surgery;  Laterality: N/A;    Family History  Problem Relation Age of Onset  . Emphysema    . Lung cancer      Social History History  Substance Use Topics  . Smoking status: Former Smoker -- 1.5 packs/day for 50 years    Types: Cigarettes    Quit date: 03/14/2011  . Smokeless tobacco: Never Used  . Alcohol Use: No    Current Outpatient Prescriptions  Medication Sig Dispense Refill  . acetaminophen (TYLENOL) 500 MG tablet Take 1,000 mg by mouth daily as needed. For pain       . albuterol (PROVENTIL HFA) 108 (90 BASE) MCG/ACT inhaler Inhale 2 puffs into the lungs every 4 (four) hours as needed for wheezing or shortness of breath.  3.7 Inhaler  1  . clopidogrel (PLAVIX) 75 MG tablet Take 1 tablet (75 mg total) by mouth daily with breakfast.  30 tablet  1  . Fluticasone-Salmeterol (ADVAIR DISKUS) 250-50 MCG/DOSE AEPB Inhale 1 puff into the lungs 2 (two) times daily.  1 each  5  . guaiFENesin (MUCINEX) 600 MG 12 hr tablet Take 600 mg by mouth 2 (two) times daily as needed. For congestion      . levothyroxine (SYNTHROID, LEVOTHROID) 125 MCG tablet Take 125 mcg by mouth daily.        Marland Kitchen loratadine (CLARITIN) 10 MG tablet Take 10 mg by mouth daily as needed.       . metFORMIN (GLUMETZA) 500 MG (MOD) 24 hr tablet Take 500 mg by mouth 2 (two) times daily with a meal. 1 tablet twice a day      . metoprolol tartrate (LOPRESSOR) 25 MG tablet Take 1 tablet (25 mg total) by mouth 2 (two) times daily.  60 tablet  1  . nicotine (NICODERM CQ - DOSED IN MG/24 HOURS) 21 mg/24hr patch Place 1 patch onto the skin daily.  28 patch  1  . rosuvastatin (CRESTOR) 10 MG tablet Take 1 tablet (10 mg total) by mouth daily at 6 PM.  30 tablet  1  . tiotropium (SPIRIVA HANDIHALER) 18 MCG inhalation capsule Place 1 capsule (18 mcg total) into inhaler and inhale daily.  30 capsule  2    Allergies  Allergen Reactions  . Sulfonamide Derivatives Other (See Comments)    Patient "drasws up" muscularly    Review of SystemsImproved appetite, improved MG, some pain in his right lower leg above the ankle where the vein was harvested but no sign of infection. Blood sugars are between 90 and 140  BP 127/75  Pulse 86  Resp 20  Ht 5\' 4"  (1.626 m)  Wt 202 lb (91.627 kg)  BMI 34.67 kg/m2  SpO2 94% Physical Exam General appearance alert and oriented obese male in no distress Breath sounds clear and equal Sternal incision well-healed Cardiac rhythm regular without murmur Leg incision healed with some  tenderness over the pretibial area at the vein tunnel no significant edema  Diagnostic Tests: Chest x-ray shows clear lung fields no pleural effusion evidence of COPD   Impression:Stable course following multivessel bypass grafting for severe three-vessel disease.   Plan:The patient will be released to the care of his primary care physician and cardiologist return here as needed. Instructions regarding driving and lifting were discussed with the patient.

## 2011-04-28 ENCOUNTER — Encounter: Payer: Self-pay | Admitting: Nurse Practitioner

## 2011-04-28 ENCOUNTER — Ambulatory Visit (INDEPENDENT_AMBULATORY_CARE_PROVIDER_SITE_OTHER): Payer: Medicare Other | Admitting: Nurse Practitioner

## 2011-04-28 VITALS — BP 132/82 | HR 79 | Ht 64.0 in | Wt 205.0 lb

## 2011-04-28 DIAGNOSIS — R0789 Other chest pain: Secondary | ICD-10-CM

## 2011-04-28 DIAGNOSIS — I2510 Atherosclerotic heart disease of native coronary artery without angina pectoris: Secondary | ICD-10-CM

## 2011-04-28 NOTE — Assessment & Plan Note (Signed)
He has had recent CABG. Unfortunately, he has returned to smoking. Cessation is encouraged. He is on Crestor for his lipids.

## 2011-04-28 NOTE — Progress Notes (Signed)
Laveda Norman Date of Birth: [birth year] Medical Record #[id number]  History of Present Illness: Mr. Pollan is seen today for a work in visit. He is seen for Dr. Eden Emms. He is a 66 year old male with history of known CAD. Has had recent CABG x 3 about 5 weeks ago. Has COPD and is already back smoking some. Now on Crestor for his lipids. His other problems include DM, HTN and HLD. He has had prior lung cancer with lobectomy.  He comes in today. He is here because the nurse with his insurance company spoke with him yesterday. He reported to her that he was having some pain on the right side of his chest. It is sore to touch and sore to move. This is nothing like his prior chest pain syndrome. Ultram helps. He does not exercise. Says he is too limited by hip pain. Not interested in going to cardiac rehab. Has some chronic shortness of breath. He is taking his medicines. He is back smoking.   Current Outpatient Prescriptions on File Prior to Visit  Medication Sig Dispense Refill  . acetaminophen (TYLENOL) 500 MG tablet Take 1,000 mg by mouth daily as needed. For pain      . albuterol (PROVENTIL HFA) 108 (90 BASE) MCG/ACT inhaler Inhale 2 puffs into the lungs every 4 (four) hours as needed for wheezing or shortness of breath.  3.7 Inhaler  1  . Fluticasone-Salmeterol (ADVAIR DISKUS) 250-50 MCG/DOSE AEPB Inhale 1 puff into the lungs 2 (two) times daily.  1 each  5  . guaiFENesin (MUCINEX) 600 MG 12 hr tablet Take 600 mg by mouth 2 (two) times daily as needed. For congestion      . levothyroxine (SYNTHROID, LEVOTHROID) 125 MCG tablet Take 125 mcg by mouth daily.        Marland Kitchen loratadine (CLARITIN) 10 MG tablet Take 10 mg by mouth daily as needed.       . metFORMIN (GLUMETZA) 500 MG (MOD) 24 hr tablet Take 500 mg by mouth 2 (two) times daily with a meal. 1 tablet twice a day      . metoprolol tartrate (LOPRESSOR) 25 MG tablet Take 1 tablet (25 mg total) by mouth 2 (two) times daily.  60 tablet  1  .  rosuvastatin (CRESTOR) 10 MG tablet Take 1 tablet (10 mg total) by mouth daily at 6 PM.  30 tablet  1  . tiotropium (SPIRIVA HANDIHALER) 18 MCG inhalation capsule Place 1 capsule (18 mcg total) into inhaler and inhale daily.  30 capsule  2    Allergies  Allergen Reactions  . Sulfonamide Derivatives Other (See Comments)    Patient "drasws up" muscularly    Past Medical History  Diagnosis Date  . Emphysema   . Hyperlipidemia   . CAD (coronary artery disease)   . Lung cancer 07/2000    status post right lower lobectomy for adenocarcinoma  . Heart murmur   . Angina   . COPD (chronic obstructive pulmonary disease)   . Pneumonia   . Chronic bronchitis     "mostly in the winter time"  . Shortness of breath on exertion   . Hypothyroidism   . Diabetes mellitus   . GERD (gastroesophageal reflux disease)     Past Surgical History  Procedure Date  . Inguinal hernia repair 1960's    "? side"  . Tonsillectomy and adenoidectomy 1950's  . Lung lobectomy     right, lower; "for lung cancer"  . Coronary angioplasty 1990's  .  Coronary artery bypass graft 03/18/2011    Procedure: CORONARY ARTERY BYPASS GRAFTING (CABG);  Surgeon: Kathlee Nations Suann Larry, MD;  Location: [facility] OR;  Service: Open Heart Surgery;  Laterality: N/A;    History  Smoking status  . Former Smoker -- 1.5 packs/day for 50 years  . Types: Cigarettes  . Quit date: 03/14/2011  Smokeless tobacco  . Never Used    History  Alcohol Use No    Family History  Problem Relation Age of Onset  . Emphysema    . Lung cancer      Review of Systems: The review of systems is positive for soreness in the right chest.  All other systems were reviewed and are negative.  Physical Exam: BP 132/82  Pulse 79  Ht 5\' 4"  (1.626 m)  Wt 205 lb (92.987 kg)  BMI 35.19 kg/m2 Patient is alert and in no acute distress. Skin is warm and dry. Color is normal.  HEENT is unremarkable. Normocephalic/atraumatic. PERRL. Sclera are nonicteric. Neck is  supple. No masses. No JVD. Lungs are coarse. Cardiac exam shows a regular rate and rhythm. Sternum looks ok. Has palpable chest wall pain on the right. Abdomen is obese but soft. Extremities are without edema. Gait and ROM are intact. No gross neurologic deficits noted.   LABORATORY DATA: EKG shows sinus rhythm with nonspecific changes.    Assessment / Plan:

## 2011-04-28 NOTE — Patient Instructions (Addendum)
Smoking cessation is encouraged.   I do not think this pain you have is coming from your heart. This seems to be more muscle related and from your surgery  Keep your appointment with Dr. Eden Emms in May  Call the [facility] office at [phone] if you have any questions, problems or concerns.

## 2011-04-28 NOTE — Assessment & Plan Note (Signed)
He presents with atypical chest pain. He has had recent CABG. His symptoms are present with palpation. This is not felt to be cardiac in origin but more likely muscular. He is only 5 weeks out from his surgery. He will continue with conservative measures. We will see him back at his regular appointment in May. Patient is agreeable to this plan and will call if any problems develop in the interim.

## 2011-05-02 ENCOUNTER — Other Ambulatory Visit: Payer: Self-pay | Admitting: Pulmonary Disease

## 2011-06-21 ENCOUNTER — Ambulatory Visit: Payer: Medicare Other | Admitting: Pulmonary Disease

## 2011-06-22 ENCOUNTER — Encounter: Payer: Self-pay | Admitting: Cardiovascular Disease

## 2011-06-22 ENCOUNTER — Ambulatory Visit (INDEPENDENT_AMBULATORY_CARE_PROVIDER_SITE_OTHER): Payer: Medicare Other | Admitting: Cardiovascular Disease

## 2011-06-22 VITALS — BP 152/86 | HR 81 | Ht 64.0 in | Wt 203.0 lb

## 2011-06-22 DIAGNOSIS — J438 Other emphysema: Secondary | ICD-10-CM

## 2011-06-22 DIAGNOSIS — J449 Chronic obstructive pulmonary disease, unspecified: Secondary | ICD-10-CM | POA: Insufficient documentation

## 2011-06-22 DIAGNOSIS — I2510 Atherosclerotic heart disease of native coronary artery without angina pectoris: Secondary | ICD-10-CM

## 2011-06-22 DIAGNOSIS — K219 Gastro-esophageal reflux disease without esophagitis: Secondary | ICD-10-CM | POA: Insufficient documentation

## 2011-06-22 DIAGNOSIS — J189 Pneumonia, unspecified organism: Secondary | ICD-10-CM | POA: Insufficient documentation

## 2011-06-22 DIAGNOSIS — R0602 Shortness of breath: Secondary | ICD-10-CM | POA: Insufficient documentation

## 2011-06-22 DIAGNOSIS — E039 Hypothyroidism, unspecified: Secondary | ICD-10-CM | POA: Insufficient documentation

## 2011-06-22 DIAGNOSIS — E785 Hyperlipidemia, unspecified: Secondary | ICD-10-CM

## 2011-06-22 DIAGNOSIS — R011 Cardiac murmur, unspecified: Secondary | ICD-10-CM | POA: Insufficient documentation

## 2011-06-22 DIAGNOSIS — J42 Unspecified chronic bronchitis: Secondary | ICD-10-CM | POA: Insufficient documentation

## 2011-06-22 DIAGNOSIS — E669 Obesity, unspecified: Secondary | ICD-10-CM | POA: Insufficient documentation

## 2011-06-22 NOTE — Assessment & Plan Note (Signed)
Biggest clinical problem  Has oxygen at home.  Counseled on smoking cessation but failed patches.  F/U Clance Recommended Chantix but he is scared to take it

## 2011-06-22 NOTE — Assessment & Plan Note (Signed)
Stable with no angina and good activity level.  Continue medical Rx S/P CABG sternum well healed despite paresthesias

## 2011-06-22 NOTE — Patient Instructions (Signed)
Your physician wants you to follow-up in:  6 MONTHS WITH DR NISHAN  You will receive a reminder letter in the mail two months in advance. If you don't receive a letter, please call our office to schedule the follow-up appointment. Your physician recommends that you continue on your current medications as directed. Please refer to the Current Medication list given to you today. 

## 2011-06-22 NOTE — Progress Notes (Signed)
Patient ID: SWANSON FARNELL, male   DOB: 1945-06-26, 66 y.o.   MRN: [id number] F/U post hospitalization for CABG: Course complicated by severe COPD and smoking. D/C on oxygen Has F/U with Dr Shelle Iron and PVT. Not sure why he is on Plavix. Asked him to discuss with PVT stopping this and starting ASA. Has been intolerant to all statins in past. D/C with crestor Will take qod if pain in hands and myalgias return Wounds healing well  Back to smoking Counseled for less than 10 minuts.  He is scared to take Chantix.  Will talk to Dr Shelle Iron about it  Procedure (s): 1.Cardiac Catheterization done by Dr. Sanjuana Kava on 03/14/2011  Angiographic Findings:  Left main: No obstructive disease.  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 This is a long stenosis involving the proximal vessel and mid vessel. There is a large branch that is occluded and while it could represent a diagonal, it has an unusual distribution. 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 The distal vessel is free of disease.  Circumflex Artery: Moderate sized vessel with moderate sized OM branch. No obstructive disease.  Right Coronary Artery: Small, non-dominant vessel with 100% mid occlusion. Distal vessel fills from right to right bridging collaterals.  Left Ventricular Angiogram: LVEF 60-65%.  2.1. Coronary artery bypass grafting x3 (left internal mammary artery to  LAD, saphenous vein graft to diagonal, saphenous vein graft to  right coronary artery) with endoscopic harvest of right leg greater saphenous vein by Dr. Donata Clay on 03/18/2011.  Seen by PA 3/13 for atypical right sided pain.    ROS: Denies fever, malais, weight loss, blurry vision, decreased visual acuity, cough, sputum, SOB, hemoptysis, pleuritic pain, palpitaitons, heartburn, abdominal pain, melena, lower extremity edema, claudication, or rash.  All other systems reviewed and negative  General: Affect appropriate Obese male with COPD HEENT:  normal Neck supple with no adenopathy JVP normal no bruits no thyromegaly Lungs end expitory wheezing and good diaphragmatic motion Heart:  S1/S2 no murmur, no rub, gallop or click PMI normal Abdomen: benighn, BS positve, no tenderness, no AAA no bruit.  No HSM or HJR Distal pulses intact with no bruits No edema Neuro non-focal Skin warm and dry No muscular weakness   Current Outpatient Prescriptions  Medication Sig Dispense Refill  . acetaminophen (TYLENOL) 500 MG tablet Take 1,000 mg by mouth daily as needed. For pain      . albuterol (PROVENTIL HFA) 108 (90 BASE) MCG/ACT inhaler Inhale 2 puffs into the lungs every 4 (four) hours as needed for wheezing or shortness of breath.  3.7 Inhaler  1  . aspirin 325 MG tablet Take 325 mg by mouth daily.      . Fluticasone-Salmeterol (ADVAIR DISKUS) 250-50 MCG/DOSE AEPB Inhale 1 puff into the lungs 2 (two) times daily.  1 each  5  . guaiFENesin (MUCINEX) 600 MG 12 hr tablet Take 600 mg by mouth 2 (two) times daily as needed. For congestion      . levothyroxine (SYNTHROID, LEVOTHROID) 125 MCG tablet Take 125 mcg by mouth daily.        Marland Kitchen loratadine (CLARITIN) 10 MG tablet Take 10 mg by mouth daily as needed.       . metFORMIN (GLUMETZA) 500 MG (MOD) 24 hr tablet 1 tablet twice a day      . metoprolol tartrate (LOPRESSOR) 25 MG tablet Take 1 tablet (25 mg total) by mouth 2 (two) times daily.  60 tablet  1  .  rosuvastatin (CRESTOR) 10 MG tablet Take 1 tablet (10 mg total) by mouth daily at 6 PM.  30 tablet  1  . SPIRIVA HANDIHALER 18 MCG inhalation capsule INHALE CONTENTS OF 1 CAPSULE  DAILY  30 each  5  . traMADol (ULTRAM) 50 MG tablet PRN        Allergies  Sulfonamide derivatives  Electrocardiogram:  Assessment and Plan

## 2011-06-22 NOTE — Assessment & Plan Note (Signed)
Cholesterol is at goal.  Continue current dose of statin and diet Rx.  No myalgias or side effects.  F/U  LFT's in 6 months. Lab Results  Component Value Date   LDLCALC UNABLE TO CALCULATE IF TRIGLYCERIDE OVER 400 mg/dL 02/12/1094

## 2011-07-05 ENCOUNTER — Encounter: Payer: Self-pay | Admitting: Cardiovascular Disease

## 2011-08-09 ENCOUNTER — Encounter: Payer: Self-pay | Admitting: Pulmonary Disease

## 2011-08-09 ENCOUNTER — Ambulatory Visit (INDEPENDENT_AMBULATORY_CARE_PROVIDER_SITE_OTHER): Payer: Medicare Other | Admitting: Pulmonary Disease

## 2011-08-09 VITALS — BP 124/78 | HR 89 | Temp 98.0°F | Ht 64.0 in | Wt 213.0 lb

## 2011-08-09 DIAGNOSIS — J438 Other emphysema: Secondary | ICD-10-CM

## 2011-08-09 NOTE — Progress Notes (Signed)
  Subjective:    Patient ID: William Dyer, male    DOB: 1945/05/19, 66 y.o.   MRN: [id number]  HPI The patient comes in today for followup of his known COPD.  Since his last visit, he has had CABG, and did very well with the surgery.  He has quit using cigarettes, and currently is on an electronic cigarette.  He feels that his breathing is doing well, and denies any cough or congestion.  He feels that his exertional tolerance is at baseline or may be better.   Review of Systems  Constitutional: Negative for fever and unexpected weight change.  HENT: Positive for rhinorrhea and sneezing. Negative for ear pain, nosebleeds, congestion, sore throat, trouble swallowing, dental problem, postnasal drip and sinus pressure.   Eyes: Negative for redness and itching.  Respiratory: Positive for shortness of breath and wheezing. Negative for cough and chest tightness.   Cardiovascular: Negative for palpitations and leg swelling.  Gastrointestinal: Negative for nausea and vomiting.  Genitourinary: Positive for dysuria.  Musculoskeletal: Negative for joint swelling.  Skin: Negative for rash.  Neurological: Negative for headaches.  Hematological: Does not bruise/bleed easily.  Psychiatric/Behavioral: Negative for dysphoric mood. The patient is not nervous/anxious.   All other systems reviewed and are negative.       Objective:   Physical Exam Obese male in no acute distress Nose without purulence or discharge noted Chest totally clear to auscultation, no wheezing Cardiac exam with regular rate and rhythm Lower extremities without edema, cyanosis Alert and oriented, moves all 4 extremities.       Assessment & Plan:

## 2011-08-09 NOTE — Patient Instructions (Addendum)
Stay on advair, and can consider coming off spiriva to prove to yourself that you need it.   Would strongly encourage you to participate in cardiac rehab. Work on weight loss and conditioning. followup with me in 6mos.

## 2011-08-09 NOTE — Assessment & Plan Note (Signed)
The patient is doing well from a COPD standpoint, and has not had a recent acute exacerbation or chest infection.  He is doing well on his current inhaler regimen, and would like to try to come off Spiriva.  I am okay with this, as long as he restarts if his breathing worsens.  I have also stressed the importance of weight loss and conditioning, and encouraged him to enroll in the cardiac rehabilitation program.

## 2011-09-19 ENCOUNTER — Other Ambulatory Visit: Payer: Self-pay | Admitting: Pulmonary Disease

## 2012-02-10 ENCOUNTER — Ambulatory Visit (INDEPENDENT_AMBULATORY_CARE_PROVIDER_SITE_OTHER): Payer: Medicare Other | Admitting: Pulmonary Disease

## 2012-02-10 ENCOUNTER — Encounter: Payer: Self-pay | Admitting: Pulmonary Disease

## 2012-02-10 VITALS — BP 124/72 | HR 86 | Temp 97.7°F | Ht 64.0 in | Wt 217.0 lb

## 2012-02-10 DIAGNOSIS — Z23 Encounter for immunization: Secondary | ICD-10-CM

## 2012-02-10 DIAGNOSIS — J449 Chronic obstructive pulmonary disease, unspecified: Secondary | ICD-10-CM

## 2012-02-10 NOTE — Assessment & Plan Note (Signed)
The patient appears to be doing better since he has discontinued cigarettes, and I have congratulated him on this.  However, I have stressed to him the importance of trying to come off the electronic cigarettes as well.  He is to continue on Advair, and I would like to try him on tudorza since he has seen a decline in his breathing since being off Spiriva.  He understands this medication is in the same family, but may not have the same side effects.  He is to discontinue tudorza if he has the same issues.  I also stressed to him the importance of working on weight loss and conditioning.

## 2012-02-10 NOTE — Patient Instructions (Addendum)
Will give you the pneumovax today. Stay on advair, and will give you a trial of tudorza one inhalation am and pm.  followup with me in 6mos.

## 2012-02-10 NOTE — Progress Notes (Signed)
  Subjective:    Patient ID: William Dyer, male    DOB: 03/20/1945, 67 y.o.   MRN: [id number]  HPI Patient comes in today for followup of his known COPD.  He has quit smoking cigarettes earlier in the year, but is using an electronic cigarettes currently.  He was on Advair and Spiriva at the last visit, but discontinued Spiriva because of urinary issues and blurry vision.  He has no history of glaucoma.  Since discontinuing Spiriva, he is felt that his breathing is not as good.  He has not had any chest congestion, discolored mucus, or an acute exacerbation since his last visit.   Review of Systems  Constitutional: Negative for fever and unexpected weight change.  HENT: Positive for rhinorrhea. Negative for ear pain, nosebleeds, congestion, sore throat, sneezing, trouble swallowing, dental problem, postnasal drip and sinus pressure.   Eyes: Negative for redness and itching.  Respiratory: Positive for shortness of breath. Negative for cough, chest tightness and wheezing.   Cardiovascular: Positive for leg swelling. Negative for palpitations.  Gastrointestinal: Positive for nausea. Negative for vomiting.  Genitourinary: Negative for dysuria.  Musculoskeletal: Negative for joint swelling.  Skin: Negative for rash.  Neurological: Negative for headaches.  Hematological: Does not bruise/bleed easily.  Psychiatric/Behavioral: Negative for dysphoric mood. The patient is not nervous/anxious.        Objective:   Physical Exam Obese male in no acute distress Nose without purulence or discharge noted Neck without lymphadenopathy or thyromegaly Chest with decreased breath sounds, no wheezes or rhonchi Cardiac exam is regular rate and rhythm Lower extremities with mild edema, no cyanosis Alert and oriented, moves all 4 extremities.       Assessment & Plan:

## 2012-02-17 ENCOUNTER — Ambulatory Visit (INDEPENDENT_AMBULATORY_CARE_PROVIDER_SITE_OTHER): Payer: Medicare Other | Admitting: Cardiovascular Disease

## 2012-02-17 ENCOUNTER — Encounter: Payer: Self-pay | Admitting: Cardiovascular Disease

## 2012-02-17 VITALS — BP 143/90 | HR 80 | Ht 64.0 in | Wt 213.0 lb

## 2012-02-17 DIAGNOSIS — J449 Chronic obstructive pulmonary disease, unspecified: Secondary | ICD-10-CM

## 2012-02-17 DIAGNOSIS — E669 Obesity, unspecified: Secondary | ICD-10-CM

## 2012-02-17 DIAGNOSIS — E785 Hyperlipidemia, unspecified: Secondary | ICD-10-CM

## 2012-02-17 DIAGNOSIS — I2510 Atherosclerotic heart disease of native coronary artery without angina pectoris: Secondary | ICD-10-CM

## 2012-02-17 MED ORDER — FENOFIBRATE 145 MG PO TABS: 145.0000 mg | ORAL_TABLET | Freq: Every day | ORAL | Status: DC

## 2012-02-17 NOTE — Assessment & Plan Note (Signed)
Stable with no angina and good activity level.  Continue medical Rx  

## 2012-02-17 NOTE — Patient Instructions (Addendum)
**Note De-Identified Jayshawn Colston Obfuscation** Your physician recommends that you return for lab work in: 3 months   Your physician has recommended you make the following change in your medication: stop taking Crestor and start taking Fenofibrate 145 mg at bedtime  Your physician wants you to follow-up in: 6 months. You will receive a reminder letter in the mail two months in advance. If you don't receive a letter, please call our office to schedule the follow-up appointment.

## 2012-02-17 NOTE — Assessment & Plan Note (Signed)
He is grossly overweight which contributes to his dyspnea and mild dependant edema  Has not been motivated to take low carb diet

## 2012-02-17 NOTE — Assessment & Plan Note (Signed)
Stop all statins start fenofibrate  Labs in 3 months

## 2012-02-17 NOTE — Assessment & Plan Note (Signed)
Stopped smoking 7 months ago using e-cig  CXR stable

## 2012-02-17 NOTE — Progress Notes (Signed)
Patient ID: William Dyer, male   DOB: Jan 15, 1946, 67 y.o.   MRN: [id number] F/U post hospitalization for CABG: Course complicated by severe COPD and smoking. D/C on oxygen Has F/U with Dr Shelle Iron and PVT. Not sure why he is on Plavix. Asked him to discuss with PVT stopping this and starting ASA. Has been intolerant to all statins in past. D/C with crestor Will take qod if pain in hands and myalgias return Wounds healing well  Back to smoking Counseled for less than 10 minuts. He is scared to take Chantix. Will talk to Dr Shelle Iron about it   Procedure (s): 1.Cardiac Catheterization done by Dr. Sanjuana Kava on 03/14/2011  Angiographic Findings:  Left main: No obstructive disease.  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 This is a long stenosis involving the proximal vessel and mid vessel. There is a large branch that is occluded and while it could represent a diagonal, it has an unusual distribution. 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 The distal vessel is free of disease.  Circumflex Artery: Moderate sized vessel with moderate sized OM branch. No obstructive disease.  Right Coronary Artery: Small, non-dominant vessel with 100% mid occlusion. Distal vessel fills from right to right bridging collaterals.  Left Ventricular Angiogram: LVEF 60-65%.  2.1. Coronary artery bypass grafting x3 (left internal mammary artery to  LAD, saphenous vein graft to diagonal, saphenous vein graft to  right coronary artery) with endoscopic harvest of right leg greater saphenous vein by Dr. Donata Clay on 03/18/2011.  Seen by PA 3/13 for atypical right sided pain.   Intolerant to multiple statins with muscle pain.  Will start fenobibrate and check lipids in 3 months  ROS: Denies fever, malais, weight loss, blurry vision, decreased visual acuity, cough, sputum, SOB, hemoptysis, pleuritic pain, palpitaitons, heartburn, abdominal pain, melena, lower extremity edema, claudication, or rash.  All other  systems reviewed and negative  General: Affect appropriate Obese white male HEENT: normal Neck supple with no adenopathy JVP normal no bruits no thyromegaly Lungs clear with no wheezing and good diaphragmatic motion Heart:  S1/S2 no murmur, no rub, gallop or click PMI normal Abdomen: benighn, BS positve, no tenderness, no AAA no bruit.  No HSM or HJR Distal pulses intact with no bruits No edema Neuro non-focal Skin warm and dry No muscular weakness   Current Outpatient Prescriptions  Medication Sig Dispense Refill  . Aclidinium Bromide (TUDORZA PRESSAIR) 400 MCG/ACT AEPB Inhale into the lungs as directed.      Marland Kitchen ADVAIR DISKUS 250-50 MCG/DOSE AEPB INHALE 1 PUFF BY MOUTH TWICE DAILY  60 each  5  . aspirin 325 MG tablet Take 325 mg by mouth daily.      Marland Kitchen guaiFENesin (MUCINEX) 600 MG 12 hr tablet Take 600 mg by mouth 2 (two) times daily as needed. For congestion      . ibuprofen (ADVIL,MOTRIN) 600 MG tablet Take 600 mg by mouth as needed.      Marland Kitchen levothyroxine (SYNTHROID, LEVOTHROID) 125 MCG tablet Take 125 mcg by mouth daily.        Marland Kitchen lisinopril-hydrochlorothiazide (PRINZIDE,ZESTORETIC) 20-25 MG per tablet 1/2 tab po qd      . loratadine (CLARITIN) 10 MG tablet Take 10 mg by mouth daily as needed.       . metFORMIN (GLUCOPHAGE) 1000 MG tablet Take 1,000 mg by mouth 2 (two) times daily with a meal.        Allergies  Sulfonamide derivatives  Electrocardiogram:  04/19/77  SR rate 79 LPFB  Assessment and Plan

## 2012-02-20 ENCOUNTER — Telehealth: Payer: Self-pay | Admitting: Pulmonary Disease

## 2012-02-20 ENCOUNTER — Telehealth: Payer: Self-pay | Admitting: Cardiovascular Disease

## 2012-02-20 MED ORDER — ACLIDINIUM BROMIDE 400 MCG/ACT IN AEPB: 1.0000 | INHALATION_SPRAY | Freq: Two times a day (BID) | RESPIRATORY_TRACT | Status: DC

## 2012-02-20 NOTE — Telephone Encounter (Signed)
Pt would like Rx for tudorza called into [facility].  PT called earlier & would like to know the status.  Antionette Fairy

## 2012-02-20 NOTE — Telephone Encounter (Signed)
PT NEEDS TO TALK ABOUT NEW MEDICATION THAT WAS CALLED IN HE HAS QUESTIONS AND CONCERNS

## 2012-02-20 NOTE — Telephone Encounter (Signed)
Ok to call in [place] with refills.

## 2012-02-20 NOTE — Telephone Encounter (Signed)
Pt is aware that we have sent in the prescription

## 2012-02-20 NOTE — Telephone Encounter (Signed)
I spoke with pt and he stated the tudorza is working well for him. He stated it has improved his breathing. He stated he is not giving out of breathe as fast. He would like RX called in. Pt was giving this as a trial in addition to the advair. Please advise KC thanks

## 2012-02-21 ENCOUNTER — Encounter: Payer: Self-pay | Admitting: *Deleted

## 2012-02-21 NOTE — Telephone Encounter (Signed)
Pt calling back having questions re tricor finabrate, thinks this med causes him pain, he thinks he was on it before,  tried samples of lipofin , and thought that was what he was picking up at the pharmacy, but when he went to pick up rx it was finabrate, is this correct? pls call

## 2012-02-22 ENCOUNTER — Telehealth: Payer: Self-pay | Admitting: Pulmonary Disease

## 2012-02-22 ENCOUNTER — Telehealth: Payer: Self-pay

## 2012-02-22 NOTE — Telephone Encounter (Signed)
William Dyer called because he was given samples of Lipofen.  He states is seems to be working well and requested that it be called to the pharmacy.  When he arrived at the pharmacy he found fenofibrate waiting for him.  He states he thinks he might have reacted to the fenofibrate in the past.  I explained to him that the fenobribate is a generic form of Lipofen per Dr Eden Emms.  He agrees to try the fenofibrate and if he has problems with it, will call for the name brand Lipofen.  I warned him that the Lipofen may not be covered by medicare as it is a name brand.

## 2012-02-22 NOTE — Telephone Encounter (Signed)
Called [facility] at [phone]. Member ID # [id number]. Tudorza APPROVED until 02/06/13. Patient and pharmacy notified.

## 2012-03-02 NOTE — Telephone Encounter (Signed)
LMTCB ./CY 

## 2012-03-13 ENCOUNTER — Emergency Department (HOSPITAL_COMMUNITY): Payer: Medicare Other

## 2012-03-13 ENCOUNTER — Encounter (HOSPITAL_COMMUNITY): Payer: Self-pay | Admitting: Emergency Medicine

## 2012-03-13 ENCOUNTER — Emergency Department (HOSPITAL_COMMUNITY)
Admission: EM | Admit: 2012-03-13 | Discharge: 2012-03-14 | Disposition: A | Discharge status: Home / Self Care | Payer: Medicare Other | Attending: Emergency Medicine | Admitting: Emergency Medicine

## 2012-03-13 DIAGNOSIS — E119 Type 2 diabetes mellitus without complications: Secondary | ICD-10-CM | POA: Insufficient documentation

## 2012-03-13 DIAGNOSIS — M79603 Pain in arm, unspecified: Secondary | ICD-10-CM

## 2012-03-13 DIAGNOSIS — Z79899 Other long term (current) drug therapy: Secondary | ICD-10-CM | POA: Insufficient documentation

## 2012-03-13 DIAGNOSIS — K219 Gastro-esophageal reflux disease without esophagitis: Secondary | ICD-10-CM | POA: Insufficient documentation

## 2012-03-13 DIAGNOSIS — Z9861 Coronary angioplasty status: Secondary | ICD-10-CM | POA: Insufficient documentation

## 2012-03-13 DIAGNOSIS — Z951 Presence of aortocoronary bypass graft: Secondary | ICD-10-CM | POA: Insufficient documentation

## 2012-03-13 DIAGNOSIS — E039 Hypothyroidism, unspecified: Secondary | ICD-10-CM | POA: Insufficient documentation

## 2012-03-13 DIAGNOSIS — R079 Chest pain, unspecified: Secondary | ICD-10-CM

## 2012-03-13 DIAGNOSIS — R61 Generalized hyperhidrosis: Secondary | ICD-10-CM | POA: Insufficient documentation

## 2012-03-13 DIAGNOSIS — R011 Cardiac murmur, unspecified: Secondary | ICD-10-CM | POA: Insufficient documentation

## 2012-03-13 DIAGNOSIS — Z8701 Personal history of pneumonia (recurrent): Secondary | ICD-10-CM | POA: Insufficient documentation

## 2012-03-13 DIAGNOSIS — M79609 Pain in unspecified limb: Secondary | ICD-10-CM | POA: Insufficient documentation

## 2012-03-13 DIAGNOSIS — J4489 Other specified chronic obstructive pulmonary disease: Secondary | ICD-10-CM | POA: Insufficient documentation

## 2012-03-13 DIAGNOSIS — E785 Hyperlipidemia, unspecified: Secondary | ICD-10-CM | POA: Insufficient documentation

## 2012-03-13 DIAGNOSIS — E669 Obesity, unspecified: Secondary | ICD-10-CM | POA: Insufficient documentation

## 2012-03-13 DIAGNOSIS — Z85118 Personal history of other malignant neoplasm of bronchus and lung: Secondary | ICD-10-CM | POA: Insufficient documentation

## 2012-03-13 DIAGNOSIS — F17200 Nicotine dependence, unspecified, uncomplicated: Secondary | ICD-10-CM | POA: Insufficient documentation

## 2012-03-13 DIAGNOSIS — I2510 Atherosclerotic heart disease of native coronary artery without angina pectoris: Secondary | ICD-10-CM | POA: Insufficient documentation

## 2012-03-13 DIAGNOSIS — IMO0002 Reserved for concepts with insufficient information to code with codable children: Secondary | ICD-10-CM | POA: Insufficient documentation

## 2012-03-13 DIAGNOSIS — R110 Nausea: Secondary | ICD-10-CM | POA: Insufficient documentation

## 2012-03-13 DIAGNOSIS — J449 Chronic obstructive pulmonary disease, unspecified: Secondary | ICD-10-CM | POA: Insufficient documentation

## 2012-03-13 DIAGNOSIS — Z7982 Long term (current) use of aspirin: Secondary | ICD-10-CM | POA: Insufficient documentation

## 2012-03-13 DIAGNOSIS — Z8709 Personal history of other diseases of the respiratory system: Secondary | ICD-10-CM | POA: Insufficient documentation

## 2012-03-13 LAB — COMPREHENSIVE METABOLIC PANEL
ALT: 16 U/L (ref 0–53)
AST: 18 U/L (ref 0–37)
Albumin: 3.9 g/dL (ref 3.5–5.2)
CO2: 28 mEq/L (ref 19–32)
Calcium: 9.4 mg/dL (ref 8.4–10.5)
Chloride: 97 mEq/L (ref 96–112)
GFR calc non Af Amer: 71 mL/min — ABNORMAL LOW (ref >90)
Sodium: 134 mEq/L — ABNORMAL LOW (ref 135–145)

## 2012-03-13 LAB — CBC WITH DIFFERENTIAL/PLATELET
Basophils Absolute: 0 10*3/uL (ref 0.0–0.1)
Eosinophils Relative: 2 % (ref 0–5)
Lymphocytes Relative: 28 % (ref 12–46)
Neutro Abs: 4.3 10*3/uL (ref 1.7–7.7)
Platelets: 234 10*3/uL (ref 150–400)
RDW: 14.4 % (ref 11.5–15.5)
WBC: 6.9 10*3/uL (ref 4.0–10.5)

## 2012-03-13 LAB — POCT I-STAT TROPONIN I: Troponin i, poc: 0 ng/mL (ref 0.00–0.08)

## 2012-03-13 LAB — TROPONIN I: Troponin I: <0.3 ng/mL (ref <0.30)

## 2012-03-13 MED ORDER — IBUPROFEN 200 MG PO TABS
400.0000 mg | ORAL_TABLET | Freq: Once | ORAL | Status: AC
  Administered 2012-03-13: 400 mg via ORAL
  Filled 2012-03-13: qty 1

## 2012-03-13 MED ORDER — SODIUM CHLORIDE 0.9 % IV SOLN
INTRAVENOUS | Status: DC
  Administered 2012-03-13: 1000 mL via INTRAVENOUS

## 2012-03-13 NOTE — ED Provider Notes (Signed)
History     CSN: [id number]  Arrival date & time 03/13/12  1843   First MD Initiated Contact with Patient 03/13/12 1941      Chief Complaint  Patient presents with  . Arm Pain    (Consider location/radiation/quality/duration/timing/severity/associated sxs/prior treatment) HPI History provided by pt.  Pt has h/o remote PCI and then CABG x 3 in 03/2011.  Presents to ED complaining of 1.5 weeks of constant aching, and intermittently severe left upper arm pain.  Describes as deep, muscle pain.  Non-exertional and is not aggravated by movement.  No associated CP, SOB, neck pain, lightheadedness, N/V.  Has had random episodes of diaphoresis but does not occur at same time as arm pain.  Denies trauma and recent heavy lifting.  Was evaluated by his PCP today, there were EKG changes and he was referred to ED for further evaluation.  Has had aspirin.   Past Medical History  Diagnosis Date  . Hyperlipidemia   . CAD (coronary artery disease)     Remote PCI; s/p CABG x 3 in Feb 2013  . Lung cancer 07/2000    status post right lower lobectomy for adenocarcinoma  . Heart murmur   . COPD (chronic obstructive pulmonary disease)   . Pneumonia   . Chronic bronchitis     "mostly in the winter time"  . Shortness of breath on exertion   . Hypothyroidism   . Diabetes mellitus   . GERD (gastroesophageal reflux disease)   . Tobacco abuse   . Obesity     Past Surgical History  Procedure Date  . Inguinal hernia repair 1960's    "? side"  . Tonsillectomy and adenoidectomy 1950's  . Lung lobectomy     right, lower; "for lung cancer"  . Coronary angioplasty 1990's  . Coronary artery bypass graft 03/18/2011    Procedure: CORONARY ARTERY BYPASS GRAFTING (CABG);  Surgeon: Kathlee Nations Suann Larry, MD;  Location: [facility] OR;  Service: Open Heart Surgery;  Laterality: N/A;    Family History  Problem Relation Age of Onset  . Emphysema    . Lung cancer      History  Substance Use Topics  . Smoking status:  Current Some Day Smoker -- 1.5 packs/day for 50 years    Types: Cigarettes    Last Attempt to Quit: 03/14/2011  . Smokeless tobacco: Never Used     Comment: PATIENT USES VAPOR CIG  . Alcohol Use: No      Review of Systems  All other systems reviewed and are negative.    Allergies  Statins and Sulfonamide derivatives  Home Medications   Current Outpatient Rx  Name  Route  Sig  Dispense  Refill  . ACLIDINIUM BROMIDE 400 MCG/ACT IN AEPB   Inhalation   Inhale 1 puff into the lungs 2 (two) times daily.   1 each   5   . ASPIRIN 325 MG PO TABS   Oral   Take 325 mg by mouth daily.         Marland Kitchen FLUTICASONE-SALMETEROL 250-50 MCG/DOSE IN AEPB               . GUAIFENESIN ER 600 MG PO TB12   Oral   Take 600 mg by mouth 2 (two) times daily as needed. For congestion         . IBUPROFEN 600 MG PO TABS   Oral   Take 600 mg by mouth as needed.         Marland Kitchen  LEVOTHYROXINE SODIUM 125 MCG PO TABS   Oral   Take 125 mcg by mouth daily.           Marland Kitchen LISINOPRIL-HYDROCHLOROTHIAZIDE 20-25 MG PO TABS      1/2 tab po qd         . LORATADINE 10 MG PO TABS   Oral   Take 10 mg by mouth daily as needed.          Marland Kitchen METFORMIN HCL 1000 MG PO TABS   Oral   Take 1,000 mg by mouth 2 (two) times daily with a meal.         . NITROGLYCERIN 0.4 MG SL SUBL   Sublingual   Place 0.4 mg under the tongue every 5 (five) minutes as needed. For chest pain           BP 104/57  Pulse 73  Temp 98 F (36.7 C) (Oral)  Resp 16  SpO2 94%  Physical Exam  Nursing note and vitals reviewed. Constitutional: He is oriented to person, place, and time. He appears well-developed and well-nourished. No distress.  HENT:  Head: Normocephalic and atraumatic.  Eyes:       Normal appearance  Neck: Normal range of motion.  Cardiovascular: Normal rate and regular rhythm.   Pulmonary/Chest: Effort normal and breath sounds normal. No respiratory distress.  Abdominal: Soft. Bowel sounds are normal. He  exhibits no distension. There is no tenderness.       obese  Musculoskeletal: Normal range of motion.       LUE w/out skin changes.  Tenderness to deep palpation of biceps.  Nml wrist and elbow but pain w/ passive flexion of shoulder.  Distal NV intact.   Neurological: He is alert and oriented to person, place, and time.  Skin: Skin is warm and dry. No rash noted.  Psychiatric: He has a normal mood and affect. His behavior is normal.    ED Course  Procedures (including critical care time)   Date: 03/13/2012  Rate: 76  Rhythm: normal sinus rhythm  QRS Axis: normal  Intervals: normal  ST/T Wave abnormalities: normal  Conduction Disutrbances:nonspecific intraventricular conduction delay  Narrative Interpretation:   Old EKG Reviewed: changes noted (Pt had ST elevation on EKG in 03/2011)   Labs Reviewed  COMPREHENSIVE METABOLIC PANEL - Abnormal; Notable for the following:    Sodium 134 (*)     Glucose, Bld 118 (*)     Alkaline Phosphatase 33 (*)     GFR calc non Af Amer 71 (*)     GFR calc Af Amer 82 (*)     All other components within normal limits  CBC WITH DIFFERENTIAL  POCT I-STAT TROPONIN I   Dg Chest 2 View  03/13/2012  *RADIOLOGY REPORT*  Clinical Data: Left arm pain  CHEST - 2 VIEW  Comparison: 04/13/2011  Findings: Normal heart size.  Bibasilar heterogeneous opacities are increased.  No Kerley B lines to suggest edema. No pneumothorax. Chronic pleural thickening at the lung bases.  IMPRESSION: Increased bibasilar opacities worrisome for mild patchy airspace disease verse atelectasis versus interval scarring.   Original Report Authenticated By: Jolaine Click, M.D.      1. Arm pain       MDM  [phone] M w/ h/o severe CAD, s/p CABG x 3 in 2013, presents w/ L upper arm pain.  Was evaluated by his PCP today and sent to ED because he had EKG changes and there was concern he might be experiencing  an anginal equivalent.  Has not had CP/SOB.  Has received aspirin.  Pain is  reproducible w/ deep palpation and ROM of L shoulder on exam.  Suspect pain is musculoskeletal.  Consulted [facility], they have seen pt in ED, request non-POC troponin and then discharge home to f/u outpatient if negative.  Pt is comfortable w/ this plan.    Pt reports having mild L elbow pain but is otherwise feeling well.  VSS.  3rd troponin negative.  Advised him to f/u with his cardiologist asap.  Discharged home.        Otilio Miu, PA-C 03/14/12 [phone]

## 2012-03-13 NOTE — ED Notes (Signed)
Family at bedside. 

## 2012-03-13 NOTE — ED Notes (Signed)
Onset 4 days ago pain left upper arm with intermittent nausea, diaphoresis.  Pain intermittent currently 0.5/10 seen at [facility] office sent to ED for evaluation. EMS gave 4 nitro and 324mg  PO aspirin. Ax4.

## 2012-03-13 NOTE — ED Notes (Signed)
Pt asked to see MD and asked for coffee.  Explained to pt per MD that he would be seen shortly, but it's protocol to hold off on drinks until after being seen by MD, and usually after any tests are resulted.

## 2012-03-13 NOTE — Consult Note (Signed)
Cardiology Consult Note [name], MD No ref. provider found  Reason for consult: left arm/shoulder pain  History of Present Illness (and review of medical records): William Dyer is a 67 y.o. male who presents for evaluation of left shoulder/upper arm pain.  This has been present for one week in duration.  Pain is off and on and not associated with activity.  Pain also has no associated symptoms. He has been taking Ibuprofen at home with good relief.  He went to see his PCP today and was sent to ED for further evaluation given abnormal ecg which I do not have for review at this time.  He currently denies any chest pain.  He has 3/10 left shoulder arm pain.  He can not recall any trauma, injury or fall.  He was given 4 NTG and ASA 325 by PCP and EMS prior to arrival here in ED.  Cardiology was called for further evaluation. Of note he had CABG in 03/2011.  He has been seen recently by Cardiology Dr. Eden Emms in Jan 2014 and PCP without any recent problems.    Cardiac Catheterization done by Dr. Sanjuana Kava on 03/14/2011  Angiographic Findings:  Left main: No obstructive disease.  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 This is a long stenosis involving the proximal vessel and mid vessel. There is a large branch that is occluded and while it could represent a diagonal, it has an unusual distribution. 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 The distal vessel is free of disease.  Circumflex Artery: Moderate sized vessel with moderate sized OM branch. No obstructive disease.  Right Coronary Artery: Small, non-dominant vessel with 100% mid occlusion. Distal vessel fills from right to right bridging collaterals.  Left Ventricular Angiogram: LVEF 60-65%.  2.1. Coronary artery bypass grafting x3 (left internal mammary artery to  LAD, saphenous vein graft to diagonal, saphenous vein graft to  right coronary artery) with endoscopic harvest of right leg greater saphenous  vein by Dr. Donata Clay on 03/18/2011.  Echo: - Normal LV size and systolic function, EF 60-65%. Mild LV hypertrophy. No significant valvular dysfunction. Normal RV size and systolic function.  Review of Systems He denies chest pain, dyspnea on exertion, PND, orthopnea, palpitations, presyncope or syncope. A comprehensive review of systems was negative other than stated in HPI. Patient Active Problem List   Diagnosis Date Noted  . Heart murmur   . Pneumonia   . Shortness of breath on exertion   . Hypothyroidism   . GERD (gastroesophageal reflux disease)   . Obesity   . CAD (coronary artery disease) 03/02/2011  . HYPERLIPIDEMIA 02/06/2007  . COPD (chronic obstructive pulmonary disease) 02/06/2007  . Lung cancer 07/08/2000   Past Medical History  Diagnosis Date  . Hyperlipidemia   . CAD (coronary artery disease)     Remote PCI; s/p CABG x 3 in Feb 2013  . Lung cancer 07/2000    status post right lower lobectomy for adenocarcinoma  . Heart murmur   . COPD (chronic obstructive pulmonary disease)   . Pneumonia   . Chronic bronchitis     "mostly in the winter time"  . Shortness of breath on exertion   . Hypothyroidism   . Diabetes mellitus   . GERD (gastroesophageal reflux disease)   . Tobacco abuse   . Obesity     Past Surgical History  Procedure Date  . Inguinal hernia repair 1960's    "? side"  . Tonsillectomy and  adenoidectomy 1950's  . Lung lobectomy     right, lower; "for lung cancer"  . Coronary angioplasty 1990's  . Coronary artery bypass graft 03/18/2011    Procedure: CORONARY ARTERY BYPASS GRAFTING (CABG);  Surgeon: Kathlee Nations Suann Larry, MD;  Location: [facility] OR;  Service: Open Heart Surgery;  Laterality: N/A;    Current Facility-Administered Medications  Medication Dose Route Frequency Provider Last Rate Last Dose  . 0.9 %  sodium chloride infusion   Intravenous Continuous Toy Baker, MD 20 mL/hr at 03/13/12 1937 1,000 mL at 03/13/12 1610   Current Outpatient  Prescriptions  Medication Sig Dispense Refill  . Aclidinium Bromide (TUDORZA PRESSAIR) 400 MCG/ACT AEPB Inhale 1 puff into the lungs 2 (two) times daily.  1 each  5  . aspirin 325 MG tablet Take 325 mg by mouth daily.      . Fluticasone-Salmeterol (ADVAIR DISKUS) 250-50 MCG/DOSE AEPB       . guaiFENesin (MUCINEX) 600 MG 12 hr tablet Take 600 mg by mouth 2 (two) times daily as needed. For congestion      . ibuprofen (ADVIL,MOTRIN) 600 MG tablet Take 600 mg by mouth as needed.      Marland Kitchen levothyroxine (SYNTHROID, LEVOTHROID) 125 MCG tablet Take 125 mcg by mouth daily.        Marland Kitchen lisinopril-hydrochlorothiazide (PRINZIDE,ZESTORETIC) 20-25 MG per tablet 1/2 tab po qd      . loratadine (CLARITIN) 10 MG tablet Take 10 mg by mouth daily as needed.       . metFORMIN (GLUCOPHAGE) 1000 MG tablet Take 1,000 mg by mouth 2 (two) times daily with a meal.      . nitroGLYCERIN (NITROSTAT) 0.4 MG SL tablet Place 0.4 mg under the tongue every 5 (five) minutes as needed. For chest pain        Allergies  Allergen Reactions  . Statins   . Sulfonamide Derivatives Other (See Comments)    Patient "drasws up" muscularly    History  Substance Use Topics  . Smoking status: Current Some Day Smoker -- 1.5 packs/day for 50 years    Types: Cigarettes    Last Attempt to Quit: 03/14/2011  . Smokeless tobacco: Never Used     Comment: PATIENT USES VAPOR CIG  . Alcohol Use: No    Family History  Problem Relation Age of Onset  . Emphysema    . Lung cancer      Objective: Patient Vitals for the past 8 hrs:  BP Temp Temp src Pulse Resp SpO2  03/13/12 2130 104/62 mmHg - - 74  13  98 %  03/13/12 2045 114/59 mmHg - - 78  20  93 %  03/13/12 2036 - - - 73  16  94 %  03/13/12 2035 104/57 mmHg - - - - -  03/13/12 1945 110/56 mmHg - - 86  18  96 %  03/13/12 1930 103/55 mmHg - - 80  18  96 %  03/13/12 1915 106/53 mmHg - - 75  15  95 %  03/13/12 1903 109/62 mmHg 98 F (36.7 C) Oral 79  19  94 %  03/13/12 1853 - - - - - 94 %    General Appearance:    Alert, cooperative, no distress, appears stated age, obese male  Head:    Normocephalic, without obvious abnormality, atraumatic  Eyes:     PERRL, EOMI, anicteric sclerae  Neck:   Supple, no carotid bruit or JVD  Lungs:     Clear to  auscultation bilaterally, respirations unlabored  Heart:    Regular rate and rhythm, S1 and S2 normal, no murmurs  Abdomen:     Soft, non-tender, normoactive bowel sounds  Extremities:   Right arm full range of movement, no pain, normal strength both arms, left arm pain with raising above head, pain over biceps muscle and shoulder joint., atraumatic, no cyanosis, trace LE edema  Pulses:   2+ and symmetric all extremities  Skin:   no rashes or lesions, well healed mid sternotomy scar  Neurologic:   No focal deficits. AAO x3   Results for orders placed during the hospital encounter of 03/13/12 (from the past 48 hour(s))  CBC WITH DIFFERENTIAL     Status: Normal   Collection Time   03/13/12  7:15 PM      Component Value Range Comment   WBC 6.9  4.0 - 10.5 K/uL    RBC 4.60  4.22 - 5.81 MIL/uL    Hemoglobin 13.9  13.0 - 17.0 g/dL    HCT 40.9  81.1 - 91.4 %    MCV 89.6  78.0 - 100.0 fL    MCH 30.2  26.0 - 34.0 pg    MCHC 33.7  30.0 - 36.0 g/dL    RDW 78.2  95.6 - 21.3 %    Platelets 234  150 - 400 K/uL    Neutrophils Relative 61  43 - 77 %    Neutro Abs 4.3  1.7 - 7.7 K/uL    Lymphocytes Relative 28  12 - 46 %    Lymphs Abs 2.0  0.7 - 4.0 K/uL    Monocytes Relative 8  3 - 12 %    Monocytes Absolute 0.5  0.1 - 1.0 K/uL    Eosinophils Relative 2  0 - 5 %    Eosinophils Absolute 0.2  0.0 - 0.7 K/uL    Basophils Relative 0  0 - 1 %    Basophils Absolute 0.0  0.0 - 0.1 K/uL   COMPREHENSIVE METABOLIC PANEL     Status: Abnormal   Collection Time   03/13/12  7:15 PM      Component Value Range Comment   Sodium 134 (*) 135 - 145 mEq/L    Potassium 3.9  3.5 - 5.1 mEq/L    Chloride 97  96 - 112 mEq/L    CO2 28  19 - 32 mEq/L    Glucose,  Bld 118 (*) 70 - 99 mg/dL    BUN 19  6 - 23 mg/dL    Creatinine, Ser 0.86  0.50 - 1.35 mg/dL    Calcium 9.4  8.4 - 57.8 mg/dL    Total Protein 7.0  6.0 - 8.3 g/dL    Albumin 3.9  3.5 - 5.2 g/dL    AST 18  0 - 37 U/L    ALT 16  0 - 53 U/L    Alkaline Phosphatase 33 (*) 39 - 117 U/L    Total Bilirubin 0.4  0.3 - 1.2 mg/dL    GFR calc non Af Amer 71 (*) >90 mL/min    GFR calc Af Amer 82 (*) >90 mL/min   POCT I-STAT TROPONIN I     Status: Normal   Collection Time   03/13/12  7:33 PM      Component Value Range Comment   Troponin i, poc 0.00  0.00 - 0.08 ng/mL    Comment 3            POCT I-STAT  TROPONIN I     Status: Normal   Collection Time   03/13/12  9:22 PM      Component Value Range Comment   Troponin i, poc 0.00  0.00 - 0.08 ng/mL    Comment 3             Dg Chest 2 View  03/13/2012  *RADIOLOGY REPORT*  Clinical Data: Left arm pain  CHEST - 2 VIEW  Comparison: 04/13/2011  Findings: Normal heart size.  Bibasilar heterogeneous opacities are increased.  No Kerley B lines to suggest edema. No pneumothorax. Chronic pleural thickening at the lung bases.  IMPRESSION: Increased bibasilar opacities worrisome for mild patchy airspace disease verse atelectasis versus interval scarring.   Original Report Authenticated By: Jolaine Click, M.D.     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  He has no chest pain or associated cardiac symptoms, with normal ecg and negative biomarkers.  Recommendations: --Will plan to d/c home with follow up with PCP and Cardiology --Patient and family states understanding of signs and symptoms of acute coronary syndrome and plan to return/call 911 for evaluation. --No changes made to home medications. --Evaluation/management of MSK pain per ED.  Thank you for allowing [place] to participate in the care of William Dyer.

## 2012-03-14 ENCOUNTER — Encounter: Payer: Self-pay | Admitting: Physician Assistant

## 2012-03-14 ENCOUNTER — Telehealth: Payer: Self-pay | Admitting: Cardiovascular Disease

## 2012-03-14 ENCOUNTER — Ambulatory Visit (INDEPENDENT_AMBULATORY_CARE_PROVIDER_SITE_OTHER): Payer: Medicare Other | Admitting: Physician Assistant

## 2012-03-14 VITALS — BP 128/70 | HR 82 | Ht 64.0 in | Wt 212.2 lb

## 2012-03-14 DIAGNOSIS — M79602 Pain in left arm: Secondary | ICD-10-CM | POA: Insufficient documentation

## 2012-03-14 DIAGNOSIS — M79609 Pain in unspecified limb: Secondary | ICD-10-CM

## 2012-03-14 NOTE — Assessment & Plan Note (Addendum)
Patient not taking some of his inhalers as prescribed. I have asked him to follow up with Dr. Shelle Iron about his pulmonary medications

## 2012-03-14 NOTE — Progress Notes (Signed)
HPI:  This is a 67 year old male patient of Dr. Eden Emms who has a history of coronary artery disease status post CABG x3 in February 2013. He comes in today after spending 6 hours in the emergency room with left arm and shoulder pain. His troponins were negative x2 and EKG unchanged. The patient says his left shoulder and arm have been hurting off and on for the past week. He says it comes and goes and is is with ibuprofen. He feels like he may have a shoulder problem but these are this is similar symptoms to when he had his bypass. He denies any chest tightness, pressure, palpitations, dizziness or presyncope. He has had some sweating spells but none associated with the arm pain. He denies any exertional symptoms. His arm hurts to move in certain ways and is biceps is tender to touch. He is on oxygen at night but has not been using it as often as he should. He smokes electronically.   Allergies: -- Statins   -- Sulfonamide Derivatives -- Other (See Comments)   --  Patient "drasws up" muscularly  Current Outpatient Prescriptions on File Prior to Visit: Aclidinium Bromide (TUDORZA PRESSAIR) 400 MCG/ACT AEPB, Inhale 1 puff into the lungs 2 (two) times daily., Disp: 1 each, Rfl: 5 aspirin 325 MG tablet, Take 325 mg by mouth daily., Disp: , Rfl:  Fluticasone-Salmeterol (ADVAIR DISKUS) 250-50 MCG/DOSE AEPB, , Disp: , Rfl:   guaiFENesin (MUCINEX) 600 MG 12 hr tablet, Take 600 mg by mouth 2 (two) times daily as needed. For congestion, Disp: , Rfl:  ibuprofen (ADVIL,MOTRIN) 600 MG tablet, Take 600 mg by mouth as needed., Disp: , Rfl:  levothyroxine (SYNTHROID, LEVOTHROID) 125 MCG tablet, Take 125 mcg by mouth daily.  , Disp: , Rfl:  lisinopril-hydrochlorothiazide (PRINZIDE,ZESTORETIC) 20-25 MG per tablet, 1/2 tab po qd, Disp: , Rfl:  loratadine (CLARITIN) 10 MG tablet, Take 10 mg by mouth daily as needed. , Disp: , Rfl:  metFORMIN (GLUCOPHAGE) 1000 MG tablet, Take 1,000 mg by mouth 2 (two) times daily with a  meal., Disp: , Rfl:  nitroGLYCERIN (NITROSTAT) 0.4 MG SL tablet, Place 0.4 mg under the tongue every 5 (five) minutes as needed. For chest pain, Disp: , Rfl:     Past Medical History:   Hyperlipidemia                                               CAD (coronary artery disease)                                  Comment:Remote PCI; s/p CABG x 3 in Feb 2013   Lung cancer                                     07/2000         Comment:status post right lower lobectomy for               adenocarcinoma   Heart murmur  COPD (chronic obstructive pulmonary disease)                 Pneumonia                                                    Chronic bronchitis                                             Comment:"mostly in the winter time"   Shortness of breath on exertion                              Hypothyroidism                                               Diabetes mellitus                                            GERD (gastroesophageal reflux disease)                       Tobacco abuse                                                Obesity                                                     Past Surgical History:   INGUINAL HERNIA REPAIR                          1960's         Comment:"? side"   TONSILLECTOMY AND ADENOIDECTOMY                 1950's       LUNG LOBECTOMY                                                 Comment:right, lower; "for lung cancer"   CORONARY ANGIOPLASTY                            1990's       CORONARY ARTERY BYPASS GRAFT                    03/18/2011       Comment:Procedure: CORONARY ARTERY BYPASS GRAFTING               (CABG);  Surgeon: Kathlee Nations Trigt  III, MD;                Location: [facility] OR;  Service: Open Heart Surgery;               Laterality: N/A;  Review of patient's family history indicates:   Emphysema                                               Lung cancer                                              Social History   Marital Status: Married             Spouse Name:                      Years of Education:                 Number of children:             Occupational History   None on file  Social History Main Topics   Smoking Status: Current Some Day Smoker         Packs/Day: 1.5   Years: 50        Types: Cigarettes     Last Attempt to quit: 03/14/2011   Smokeless Status: Never Used                       Comment: PATIENT USES VAPOR CIG   Alcohol Use: No             Drug Use: No             Sexual Activity: Not Currently      Other Topics            Concern   None on file  Social History Narrative   None on file    ROS:see history of present illness otherwise negative   PHYSICAL EXAM: Obese, in no acute distress. Neck: No JVD, HJR, Bruit, or thyroid enlargement  Lungs: No tachypnea, clear without wheezing, rales, or rhonchi  Cardiovascular: RRR, PMI not displaced, positive S4, 2/6 systolic murmur at the left sternal border, no bruit, thrill, or heave.  Abdomen: BS normal. Soft without organomegaly, masses, lesions or tenderness.  Extremities: without cyanosis, clubbing or edema. Good distal pulses bilateral  SKin: Warm, no lesions or rashes   Musculoskeletal: No deformities  Neuro: no focal signs  BP 128/70  Pulse 82  Ht 5\' 4"  (1.626 m)  Wt 212 lb 3.2 oz (96.253 kg)  BMI 36.42 kg/m2   ZOX:WRUEAV sinus rhythm, normal EKG

## 2012-03-14 NOTE — Telephone Encounter (Signed)
Pt was seen at [facility] ED last night due to his left arm hurting up to his shoulder and his pcp did a ekg and it showed some irregularity in it and he was told to call our office and be seen today.

## 2012-03-14 NOTE — Assessment & Plan Note (Signed)
Patient has history of CABG x3 with the LIMA to the LAD, SVG to the diagonal, SVG to the RCA in 2/13. He is now having left shoulder and arm pain for the past week. Troponins were negative in the emergency room. EKG is unchanged. We have scheduled him for Lexiscan tomorrow morning at 745. He will follow up with Dr.Nishan afterwards.

## 2012-03-14 NOTE — Telephone Encounter (Signed)
Spoke with pt. Pt states he went to see Dr [name] yesterday for left shoulder pain down his left arm to his elbow.This was similar to symptoms prior to CABG last year.  Pt was sent by EMS from Dr Hennie Duos office to ED for further evaluation. Pt was evaluated in ED and discharged. He was told to call his cardiologist today for follow-up. He states he had shoulder pain when he woke up this morning and took ibuprofen with relief. He states he is symptom free right now. I reviewed with Dr Ladona Ridgel and he recommended pt schedule a follow-up appt with Dr Eden Emms. I offered pt an appt with Dr Eden Emms 03/16/12. He is concerned about his symptoms and is requesting a sooner appt. I have scheduled pt to see PA today at 1:30pm.

## 2012-03-14 NOTE — Patient Instructions (Signed)
Your physician has requested that you have a lexiscan myoview. For further information please visit [email or web address]. Please follow instruction sheet, as given.   

## 2012-03-14 NOTE — ED Provider Notes (Signed)
Medical screening examination/treatment/procedure(s) were conducted as a shared visit with non-physician practitioner(s) and myself.  I personally evaluated the patient during the encounter Pt with hx cad, cabg. States onset upper arm pain in past day. States same as he experienced w heart problems prior to bypass. Chest cta. Rrr. Ecg, labs. Card eval.   Suzi Roots, MD 03/14/12 [phone]

## 2012-03-14 NOTE — Telephone Encounter (Signed)
Spoke with pt. Asked pt to have someone drive him to appt today but he declined.

## 2012-03-14 NOTE — Assessment & Plan Note (Signed)
Patient has history of CABG x3 with the LIMA to the LAD, SVG to the diagonal, SVG to the RCA in 2/13. He is now having left shoulder and arm pain for the past week. Troponins were negative in the emergency room. EKG is unchanged. We have scheduled him for Lexiscan tomorrow morning at 745. He will follow up with Dr.Nishan afterwards. 

## 2012-03-14 NOTE — Telephone Encounter (Signed)
Follow-up: ° ° ° °Patient called in returning your call.  Please call back. °

## 2012-03-15 ENCOUNTER — Ambulatory Visit (HOSPITAL_COMMUNITY): Payer: Medicare Other | Attending: Cardiology | Admitting: Radiology

## 2012-03-15 VITALS — BP 132/68 | HR 78 | Ht 64.0 in | Wt 212.0 lb

## 2012-03-15 DIAGNOSIS — F17200 Nicotine dependence, unspecified, uncomplicated: Secondary | ICD-10-CM | POA: Insufficient documentation

## 2012-03-15 DIAGNOSIS — R079 Chest pain, unspecified: Secondary | ICD-10-CM

## 2012-03-15 DIAGNOSIS — E119 Type 2 diabetes mellitus without complications: Secondary | ICD-10-CM | POA: Insufficient documentation

## 2012-03-15 DIAGNOSIS — I2510 Atherosclerotic heart disease of native coronary artery without angina pectoris: Secondary | ICD-10-CM

## 2012-03-15 DIAGNOSIS — M79602 Pain in left arm: Secondary | ICD-10-CM

## 2012-03-15 DIAGNOSIS — R0789 Other chest pain: Secondary | ICD-10-CM | POA: Insufficient documentation

## 2012-03-15 DIAGNOSIS — R0989 Other specified symptoms and signs involving the circulatory and respiratory systems: Secondary | ICD-10-CM | POA: Insufficient documentation

## 2012-03-15 DIAGNOSIS — R0609 Other forms of dyspnea: Secondary | ICD-10-CM | POA: Insufficient documentation

## 2012-03-15 DIAGNOSIS — I10 Essential (primary) hypertension: Secondary | ICD-10-CM | POA: Insufficient documentation

## 2012-03-15 DIAGNOSIS — R0602 Shortness of breath: Secondary | ICD-10-CM

## 2012-03-15 MED ORDER — TECHNETIUM TC 99M SESTAMIBI GENERIC - CARDIOLITE
33.0000 | Freq: Once | INTRAVENOUS | Status: AC | PRN
  Administered 2012-03-15: 33 via INTRAVENOUS

## 2012-03-15 MED ORDER — TECHNETIUM TC 99M SESTAMIBI GENERIC - CARDIOLITE
10.8000 | Freq: Once | INTRAVENOUS | Status: AC | PRN
  Administered 2012-03-15: 11 via INTRAVENOUS

## 2012-03-15 MED ORDER — REGADENOSON 0.4 MG/5ML IV SOLN
0.4000 mg | Freq: Once | INTRAVENOUS | Status: AC
  Administered 2012-03-15: 0.4 mg via INTRAVENOUS

## 2012-03-15 NOTE — Telephone Encounter (Signed)
PER PT  IS TAKING TRICOR  AT THIS TIME WILL CONTINUE  AND IF ANY PROBLEMS ARISE WILL CALL BACK .[name]

## 2012-03-15 NOTE — Progress Notes (Signed)
[facility] SITE 3 NUCLEAR MED [address] [place], [place] [address] [phone]    Cardiology Nuclear Med Study  MARKUS CASTEN is a 68 y.o. male     MRN : [id number]     DOB: 07/26/1945  Procedure Date: 03/15/2012  Nuclear Med Background Indication for Stress Test:  Evaluation for Ischemia, Graft Patency and 03/13/12 [facility] with (L) Shoulder/Arm Pain, (-) Troponin's History:  1/13 OZH:[id number] scar with mild P.I. ischemia, EF=71%; 1/13 Echo:EF=65%; 2/13 CABG Cardiac Risk Factors: Hypertension, Lipids, NIDDM, Obesity and Smoker  Symptoms:  Chest Pressure.  (last date of chest discomfort none since discharge; left arm pain last night and now ), Diaphoresis, DOE and SOB   Nuclear Pre-Procedure Caffeine/Decaff Intake:  None NPO After: 9:00pm   Lungs:  Clear. O2 Sat: 95 % on 2 liters of O2 IV 0.9% NS with Angio Cath:  20g  IV Site: R Antecubital  IV Started by:  Stanton Kidney, EMT-P  Chest Size (in):  46 Cup Size: n/a  Height: 5\' 4"  (1.626 m)  Weight:  212 lb (96.163 kg)  BMI:  Body mass index is 36.39 kg/(m^2). Tech Comments:  CBG= 127 @ 06:45, per patient.    Nuclear Med Study 1 or 2 day study: 1 day  Stress Test Type:  Lexiscan  Reading MD: Olga Millers, MD  Order Authorizing Provider:  [name], MD; Herma Carson, MD  Resting Radionuclide: Technetium 16m Sestamibi  Resting Radionuclide Dose: 10.8 mCi   Stress Radionuclide:  Technetium 38m Sestamibi  Stress Radionuclide Dose: 33.0 mCi           Stress Protocol Rest HR: 78 Stress HR: 98  Rest BP: 132/68 Stress BP: 129/69  Exercise Time (min): n/a METS: n/a   Predicted Max HR: 153 bpm % Max HR: 64.05 bpm Rate Pressure Product: 28413    Dose of Adenosine (mg):  n/a Dose of Lexiscan: 0.4 mg  Dose of Atropine (mg): n/a Dose of Dobutamine: n/a mcg/kg/min (at max HR)  Stress Test Technologist: Smiley Houseman, CMA-N  Nuclear Technologist:  Domenic Polite, CNMT    Rest Procedure:  Myocardial perfusion  imaging was performed at rest 45 minutes following the intravenous administration of Technetium 80m Sestamibi.  Rest ECG: NSR, RAD  Stress Procedure:  The patient received IV Lexiscan 0.4 mg over 15-seconds.  Technetium 1m Sestamibi injected at 30-seconds.  He did c/o chest tightness and dyspnea with Lexiscan.  Quantitative spect images were obtained after a 45 minute delay.  Stress ECG: No significant ST segment change suggestive of ischemia.  QPS Raw Data Images:  Acquisition technically good; normal left ventricular size. Stress Images:  There is decreased uptake in the inferior wall. Rest Images:  There is decreased uptake in the inferior wall. Subtraction (SDS):  No evidence of ischemia. Transient Ischemic Dilatation (Normal <1.22):  1.08 Lung/Heart Ratio (Normal <0.45):  0.19  Quantitative Gated Spect Images QGS EDV:  88 ml QGS ESV:  31 ml  Impression Exercise Capacity:  Lexiscan with no exercise. BP Response:  Normal blood pressure response. Clinical Symptoms:  There is chest pain and dyspnea ECG Impression:  No significant ST segment change suggestive of ischemia. Comparison with Prior Nuclear Study: No images to compare  Overall Impression:  Normal stress nuclear study with a small, mild, fixed inferior defect consistent with thinning; no ischemia.  LV Ejection Fraction: 64%.  LV Wall Motion:  NL LV Function; NL Wall Motion  Olga Millers

## 2012-03-19 ENCOUNTER — Other Ambulatory Visit: Payer: Self-pay | Admitting: Family Medicine

## 2012-03-19 ENCOUNTER — Other Ambulatory Visit: Payer: Self-pay | Admitting: Pulmonary Disease

## 2012-03-19 DIAGNOSIS — M542 Cervicalgia: Secondary | ICD-10-CM

## 2012-03-20 ENCOUNTER — Telehealth: Payer: Self-pay | Admitting: Pulmonary Disease

## 2012-03-20 MED ORDER — FLUTICASONE-SALMETEROL 250-50 MCG/DOSE IN AEPB: 1.0000 | INHALATION_SPRAY | Freq: Two times a day (BID) | RESPIRATORY_TRACT | Status: DC

## 2012-03-20 NOTE — Telephone Encounter (Signed)
Pt aware rx has been called in. Nothing further was needed

## 2012-03-26 ENCOUNTER — Ambulatory Visit
Admission: RE | Admit: 2012-03-26 | Discharge: 2012-03-26 | Disposition: A | Discharge status: Home / Self Care | Payer: Medicare Other | Source: Ambulatory Visit | Attending: Family Medicine | Admitting: Family Medicine

## 2012-03-26 ENCOUNTER — Other Ambulatory Visit: Payer: Self-pay | Admitting: Family Medicine

## 2012-03-26 DIAGNOSIS — Z77018 Contact with and (suspected) exposure to other hazardous metals: Secondary | ICD-10-CM

## 2012-03-26 DIAGNOSIS — M542 Cervicalgia: Secondary | ICD-10-CM

## 2012-05-17 ENCOUNTER — Other Ambulatory Visit: Payer: Medicare Other

## 2012-05-29 ENCOUNTER — Ambulatory Visit
Admission: RE | Admit: 2012-05-29 | Discharge: 2012-05-29 | Disposition: A | Discharge status: Home / Self Care | Payer: Medicare Other | Source: Ambulatory Visit | Attending: Family Medicine | Admitting: Family Medicine

## 2012-05-29 ENCOUNTER — Other Ambulatory Visit: Payer: Self-pay | Admitting: Family Medicine

## 2012-05-29 DIAGNOSIS — R079 Chest pain, unspecified: Secondary | ICD-10-CM

## 2012-07-17 ENCOUNTER — Other Ambulatory Visit: Payer: Self-pay | Admitting: Pulmonary Disease

## 2012-07-18 ENCOUNTER — Telehealth: Payer: Self-pay | Admitting: Pulmonary Disease

## 2012-07-18 MED ORDER — FLUTICASONE-SALMETEROL 250-50 MCG/DOSE IN AEPB: 1.0000 | INHALATION_SPRAY | Freq: Two times a day (BID) | RESPIRATORY_TRACT | Status: DC

## 2012-07-18 NOTE — Telephone Encounter (Signed)
i spoke with pt and is aware rx has been sent. Nothing further was needed

## 2012-08-09 ENCOUNTER — Encounter: Payer: Self-pay | Admitting: Pulmonary Disease

## 2012-08-09 ENCOUNTER — Ambulatory Visit (INDEPENDENT_AMBULATORY_CARE_PROVIDER_SITE_OTHER): Payer: Medicare Other | Admitting: Pulmonary Disease

## 2012-08-09 VITALS — BP 132/80 | HR 77 | Temp 97.4°F | Ht 64.0 in | Wt 208.8 lb

## 2012-08-09 DIAGNOSIS — J449 Chronic obstructive pulmonary disease, unspecified: Secondary | ICD-10-CM

## 2012-08-09 NOTE — Progress Notes (Signed)
  Subjective:    Patient ID: William Dyer, male    DOB: 1945-06-30, 68 y.o.   MRN: [id number]  HPI The patient comes in today for followup of his known COPD.  He is really been doing well from a breathing standpoint, and has had no acute exacerbations since his last visit.  He is continuing on the Advair, but stopped the [place] because of expense.  He has at least stopped smoking cigarettes, and is using electronic cigarettes, and has found that he does just as well without the [place].   Review of Systems  Constitutional: Negative for fever and unexpected weight change.  HENT: Negative for ear pain, nosebleeds, congestion, sore throat, rhinorrhea, sneezing, trouble swallowing, dental problem, postnasal drip and sinus pressure.   Eyes: Negative for redness and itching.  Respiratory: Negative for cough, chest tightness, shortness of breath and wheezing.   Cardiovascular: Negative for palpitations and leg swelling.  Gastrointestinal: Negative for nausea and vomiting.  Genitourinary: Negative for dysuria.  Musculoskeletal: Negative for joint swelling.  Skin: Negative for rash.  Neurological: Negative for headaches.  Hematological: Does not bruise/bleed easily.  Psychiatric/Behavioral: Negative for dysphoric mood. The patient is not nervous/anxious.        Objective:   Physical Exam Obese male in no acute distress Nose without purulent discharge noted Neck without lymphadenopathy or thyromegaly Chest with mildly decreased breath sounds, no wheezing Cardiac exam with regular rate and rhythm Lower extremities with no significant edema, no cyanosis Alert and oriented, moves all 4 extremities.       Assessment & Plan:

## 2012-08-09 NOTE — Assessment & Plan Note (Signed)
The patient seems to be doing well from a COPD standpoint, with no recent acute exacerbation pulmonary infection.  He has quit smoking, and feels this has definitely helped his symptomatology.  I have asked him to continue on Advair, continue to stay off cigarettes, and to work on some type of conditioning program and weight loss.

## 2012-08-09 NOTE — Patient Instructions (Addendum)
Stay on advair twice a day, and have rescue inhaler available for emergencies. Continue to stay off cigarettes.  Over time, try and get off e-cigs. followup with me in 6mos.

## 2012-08-27 ENCOUNTER — Ambulatory Visit (INDEPENDENT_AMBULATORY_CARE_PROVIDER_SITE_OTHER): Payer: Medicare Other | Admitting: Cardiovascular Disease

## 2012-08-27 ENCOUNTER — Other Ambulatory Visit: Payer: Self-pay | Admitting: *Deleted

## 2012-08-27 ENCOUNTER — Encounter: Payer: Self-pay | Admitting: Cardiovascular Disease

## 2012-08-27 VITALS — BP 120/72 | HR 72 | Resp 12

## 2012-08-27 DIAGNOSIS — M79604 Pain in right leg: Secondary | ICD-10-CM | POA: Insufficient documentation

## 2012-08-27 DIAGNOSIS — M79609 Pain in unspecified limb: Secondary | ICD-10-CM

## 2012-08-27 DIAGNOSIS — I2510 Atherosclerotic heart disease of native coronary artery without angina pectoris: Secondary | ICD-10-CM

## 2012-08-27 DIAGNOSIS — I739 Peripheral vascular disease, unspecified: Secondary | ICD-10-CM

## 2012-08-27 DIAGNOSIS — E785 Hyperlipidemia, unspecified: Secondary | ICD-10-CM

## 2012-08-27 NOTE — Progress Notes (Signed)
Patient ID: William Dyer, male   DOB: 12-Aug-1945, 67 y.o.   MRN: [id number] This is a 67 year old male patient  who has a history of coronary artery disease status post CABG x3 in February 2013. Saw PA 2/14  after spending 6 hours in the emergency room with left arm and shoulder pain. His troponins were negative x2 and EKG unchanged. The patient says his left shoulder and arm have been hurting off and on for a week . He says it comes and goes and is is with ibuprofen. He feels like he may have a shoulder problem but these are this is similar symptoms to when he had his bypass. He denies any chest tightness, pressure, palpitations, dizziness or presyncope. He has had some sweating spells but none associated with the arm pain. He denies any exertional symptoms. His arm hurts to move in certain ways and is biceps is tender to touch. He is on oxygen at night but has not been using it as often as he should. He smokes electronically.  F/U myovue 03/23/12  Normal with EF 64%  Complains of RLE pain and foot always being cold   ROS: Denies fever, malais, weight loss, blurry vision, decreased visual acuity, cough, sputum, SOB, hemoptysis, pleuritic pain, palpitaitons, heartburn, abdominal pain, melena, lower extremity edema, claudication, or rash.  All other systems reviewed and negative  General: Affect appropriate Obese white male  HEENT: normal Neck supple with no adenopathy JVP normal no bruits no thyromegaly Lungs clear with no wheezing and good diaphragmatic motion Heart:  S1/S2 no murmur, no rub, gallop or click PMI normal Abdomen: benighn, BS positve, no tenderness, no AAA no bruit.  No HSM or HJR Distal pulses intact with no bruits No edema Neuro non-focal Skin warm and dry No muscular weakness   Current Outpatient Prescriptions  Medication Sig Dispense Refill  . aspirin 325 MG tablet Take 325 mg by mouth daily.      . Fluticasone-Salmeterol (ADVAIR DISKUS) 250-50 MCG/DOSE AEPB Inhale 1  puff into the lungs 2 (two) times daily.  60 each  3  . guaiFENesin (MUCINEX) 600 MG 12 hr tablet Take 600 mg by mouth 2 (two) times daily as needed. For congestion      . ibuprofen (ADVIL,MOTRIN) 600 MG tablet Take 600 mg by mouth as needed.      Marland Kitchen levothyroxine (SYNTHROID, LEVOTHROID) 125 MCG tablet Take 125 mcg by mouth daily.        Marland Kitchen lisinopril-hydrochlorothiazide (PRINZIDE,ZESTORETIC) 20-25 MG per tablet 1/2 tab po qd      . loratadine (CLARITIN) 10 MG tablet Take 10 mg by mouth daily as needed.       . metFORMIN (GLUCOPHAGE) 1000 MG tablet Take 1,000 mg by mouth 2 (two) times daily with a meal.      . nitroGLYCERIN (NITROSTAT) 0.4 MG SL tablet Place 0.4 mg under the tongue every 5 (five) minutes as needed. For chest pain       No current facility-administered medications for this visit.    Allergies  Statins and Sulfonamide derivatives  Electrocardiogram:  NSR rate 76 normal ECG  Assessment and Plan

## 2012-08-27 NOTE — Assessment & Plan Note (Addendum)
Cholesterol is at goal.  Continue current dose of statin and diet Rx.  No myalgias or side effects.  F/U  LFT's in 6 months. Lab Results  Component Value Date   LDLCALC UNABLE TO CALCULATE IF TRIGLYCERIDE OVER 400 mg/dL 03/19/5619   F/U lipid clinic intolerant to statins

## 2012-08-27 NOTE — Assessment & Plan Note (Signed)
Stable with no angina and good activity level.  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

## 2012-08-27 NOTE — Assessment & Plan Note (Signed)
More likely orthopedic  Palpable pulses at ankle F/U ABI"s

## 2012-08-27 NOTE — Addendum Note (Signed)
Addended by: Kem Parkinson on: 08/27/2012 09:51 AM   Modules accepted: Orders

## 2012-08-29 ENCOUNTER — Ambulatory Visit (INDEPENDENT_AMBULATORY_CARE_PROVIDER_SITE_OTHER): Payer: Medicare Other | Admitting: Pharmacist

## 2012-08-29 ENCOUNTER — Encounter (INDEPENDENT_AMBULATORY_CARE_PROVIDER_SITE_OTHER): Payer: Medicare Other

## 2012-08-29 VITALS — Wt 207.0 lb

## 2012-08-29 DIAGNOSIS — I739 Peripheral vascular disease, unspecified: Secondary | ICD-10-CM

## 2012-08-29 DIAGNOSIS — E785 Hyperlipidemia, unspecified: Secondary | ICD-10-CM

## 2012-08-29 NOTE — Patient Instructions (Addendum)
Stop fenofibrate  Start Crestor 2.5mg  (1/2 tablet) on Monday, Wednesday and Friday.   Recheck labs in 2 months.

## 2012-08-31 MED ORDER — ROSUVASTATIN CALCIUM 5 MG PO TABS: 2.5000 mg | ORAL_TABLET | ORAL | Status: DC

## 2012-08-31 NOTE — Progress Notes (Signed)
HPI  William Dyer is a 67 yo M pt of Dr. Eden Emms referred for hyperlipidemia.  He has a history of CAD s/p CABG x3 in February 2013.  He states he has been on some type of cholesterol medication for almost 20 years.  He states he has taken every statin there is and had joint pain with each of them.  Only other medication he can recall is fenofibrate, which did not cause any side effects but was changed due to effectiveness.  He was most recently on Crestor and Lipitor.  At his visit with Dr. Eden Emms, he was not taking any cholesterol medications so Dr. Eden Emms asked pt to restart fenofibrate.  Pt has a prescription from the pharmacy but not started yet.  He also has history of DM.  Checks his fasting CBGs and states it was 120 this morning.  He is a previous smoker but currently uses electronic cigarettes.   Diet  Breakfast: bacon and tomato sandwiches, eggs, oatmeal, Raisin Bran cereal Lunch: tomato sandwich, some fast food such as McDonalds or Wendy's.  He often gets busy and skips lunch.  Dinner: baked chicken, broccoli and cheese, frozen mixed vegetables Likes to drink black coffee Snacks/Sweets: nabs, candy bars (mound bars), blueberry cobbler  Exercise Pt does not have a regular exercise routine.  He does do yard work around [employer].  Has been having some right leg pain.  Having ABI done today.   Current Outpatient Prescriptions on File Prior to Visit  Medication Sig Dispense Refill  . aspirin 325 MG tablet Take 325 mg by mouth daily.      . Fluticasone-Salmeterol (ADVAIR DISKUS) 250-50 MCG/DOSE AEPB Inhale 1 puff into the lungs 2 (two) times daily.  60 each  3  . guaiFENesin (MUCINEX) 600 MG 12 hr tablet Take 600 mg by mouth 2 (two) times daily as needed. For congestion      . ibuprofen (ADVIL,MOTRIN) 600 MG tablet Take 600 mg by mouth as needed.      Marland Kitchen levothyroxine (SYNTHROID, LEVOTHROID) 125 MCG tablet Take 125 mcg by mouth daily.        Marland Kitchen lisinopril-hydrochlorothiazide  (PRINZIDE,ZESTORETIC) 20-25 MG per tablet 1/2 tab po qd      . loratadine (CLARITIN) 10 MG tablet Take 10 mg by mouth daily as needed.       . metFORMIN (GLUCOPHAGE) 1000 MG tablet Take 1,000 mg by mouth 2 (two) times daily with a meal.      . nitroGLYCERIN (NITROSTAT) 0.4 MG SL tablet Place 0.4 mg under the tongue every 5 (five) minutes as needed. For chest pain       No current facility-administered medications on file prior to visit.    Allergies  Allergen Reactions  . Statins   . Sulfonamide Derivatives Other (See Comments)    Patient "drasws up" muscularly

## 2012-08-31 NOTE — Assessment & Plan Note (Signed)
Most recent labs for pt were from 4/23.  TC- 273, TG- 137, HDL- 59, LDL- 187 (goal<70).  LFTs are WNL.  Discussed the importance of taking some sort of statin with patient.  Explained that often the muscle pain is related to dose and we may be able to use lower dose without side effects.  He is willing to try again.  Will try Crestor 2.5mg  MWF.  If pt tolerates and we need additional LDL lowering, may consider Zetia.  Will stop fenofibrate for now since combination with a statin increases risk of myalgias.  Plan to follow up in 2 months.

## 2012-10-30 ENCOUNTER — Other Ambulatory Visit (INDEPENDENT_AMBULATORY_CARE_PROVIDER_SITE_OTHER): Payer: Medicare Other

## 2012-10-30 DIAGNOSIS — E785 Hyperlipidemia, unspecified: Secondary | ICD-10-CM

## 2012-10-30 LAB — HEPATIC FUNCTION PANEL
ALT: 25 U/L (ref 0–53)
AST: 20 U/L (ref 0–37)
Albumin: 4.2 g/dL (ref 3.5–5.2)
Total Protein: 7.2 g/dL (ref 6.0–8.3)

## 2012-10-30 LAB — LIPID PANEL
Cholesterol: 250 mg/dL — ABNORMAL HIGH (ref 0–200)
Total CHOL/HDL Ratio: 5
Triglycerides: 417 mg/dL — ABNORMAL HIGH (ref 0.0–149.0)

## 2012-10-30 LAB — LDL CHOLESTEROL, DIRECT: Direct LDL: 129.1 mg/dL

## 2012-11-02 ENCOUNTER — Ambulatory Visit (INDEPENDENT_AMBULATORY_CARE_PROVIDER_SITE_OTHER): Payer: Medicare Other | Admitting: Pharmacist

## 2012-11-02 VITALS — Wt 205.0 lb

## 2012-11-02 DIAGNOSIS — E785 Hyperlipidemia, unspecified: Secondary | ICD-10-CM

## 2012-11-02 NOTE — Progress Notes (Signed)
HPI  William Dyer is a 67 yo M pt of Dr. Eden Emms here for follow-up for his hyperlipidemia.  He has a history of CAD s/p CABG x3 in February 2013.  He has been taking Crestor 2.5mg  three times a week (MWF). He reports that he has no muscle pain since starting this dose.  He also has history of DM.  Checks his fasting CBGs and states it was 142 this morning (after 2 donuts yesterday evening), but that otherwise he has not been >130.  He is a previous smoker but currently uses electronic cigarettes (smokes about 1 per day).   Diet  He has almost completely cut out fried foods. Breakfast: 2-3 pieces of bacon and eggs or cheerios Lunch: sometimes skips, but when he does eat it is usually ham sandwich with small amount of mayo on wheat bread  Dinner: baked chicken, porkchop, or fish and  frozen mixed vegetables Likes to drink black coffee Snacks/Sweets: nabs, candy bars (snack size mound bars or reeses)  Exercise Pt does not have a regular exercise routine, but he is active throughout the day taking care of his wife, who is on hospice.  He does yard work around [employer].     Current Outpatient Prescriptions on File Prior to Visit  Medication Sig Dispense Refill  . aspirin 325 MG tablet Take 325 mg by mouth daily.      . Fluticasone-Salmeterol (ADVAIR DISKUS) 250-50 MCG/DOSE AEPB Inhale 1 puff into the lungs 2 (two) times daily.  60 each  3  . guaiFENesin (MUCINEX) 600 MG 12 hr tablet Take 600 mg by mouth 2 (two) times daily as needed. For congestion      . ibuprofen (ADVIL,MOTRIN) 600 MG tablet Take 600 mg by mouth as needed.      Marland Kitchen levothyroxine (SYNTHROID, LEVOTHROID) 125 MCG tablet Take 125 mcg by mouth daily.        Marland Kitchen lisinopril-hydrochlorothiazide (PRINZIDE,ZESTORETIC) 20-25 MG per tablet 1/2 tab po qd      . loratadine (CLARITIN) 10 MG tablet Take 10 mg by mouth daily as needed.       . metFORMIN (GLUCOPHAGE) 1000 MG tablet Take 1,000 mg by mouth 2 (two) times daily with a meal.      .  nitroGLYCERIN (NITROSTAT) 0.4 MG SL tablet Place 0.4 mg under the tongue every 5 (five) minutes as needed. For chest pain      . rosuvastatin (CRESTOR) 5 MG tablet Take 0.5 tablets (2.5 mg total) by mouth every Monday, Wednesday, and Friday.  14 tablet  0   No current facility-administered medications on file prior to visit.    Allergies  Allergen Reactions  . Statins   . Sulfonamide Derivatives Other (See Comments)    Patient "drasws up" muscularly

## 2012-11-02 NOTE — Assessment & Plan Note (Addendum)
His lipids are not at goal. He has seen some improvement in his LDL and TC since restarting the Crestor. His triglycerides have increased, though. Today he agreed to try Crestor 5 mg 3 times weekly, but was resistant to restarting fenofibrate since this has caused muscle pain in the past. We will recheck in labs in January to assess further progress with increased Crestor dose.   Pt seen and reviewed with Weston Brass, PharmD, CPP

## 2012-11-02 NOTE — Patient Instructions (Signed)
Continue to eat well (no fried foods).  Try to increase exercise if able.  Increase dose of Crestor to 5mg  (1 tablet) on Monday, Wednesday and Friday.  We will recheck labs and see you in January.   Our next visit will be January 23 at 2:30 pm.  Get labs drawn on Tuesday 02/27/12.

## 2012-11-20 ENCOUNTER — Other Ambulatory Visit: Payer: Self-pay | Admitting: Pulmonary Disease

## 2012-12-06 ENCOUNTER — Encounter: Payer: Self-pay | Admitting: Gastroenterology

## 2013-01-09 ENCOUNTER — Encounter: Payer: Self-pay | Admitting: Gastroenterology

## 2013-01-09 ENCOUNTER — Ambulatory Visit (INDEPENDENT_AMBULATORY_CARE_PROVIDER_SITE_OTHER): Payer: Medicare Other | Admitting: Gastroenterology

## 2013-01-09 VITALS — BP 134/76 | HR 80 | Ht 64.0 in | Wt 207.0 lb

## 2013-01-09 DIAGNOSIS — K921 Melena: Secondary | ICD-10-CM

## 2013-01-09 DIAGNOSIS — F17200 Nicotine dependence, unspecified, uncomplicated: Secondary | ICD-10-CM | POA: Insufficient documentation

## 2013-01-09 MED ORDER — MOVIPREP 100 G PO SOLR: 1.0000 | Freq: Once | ORAL | Status: DC

## 2013-01-09 NOTE — Progress Notes (Signed)
HPI: This is a   very pleasant 67 year old man whom I am meeting for the first time today.  He prefers to have another stool test rather than colonoscopy at this point  He has known hemorrhoid but never sees blood in stool.  Home hemasure was positive.  No real changes in his bowels, no conatipation or diarrhea.  Wife is currently in hospice care.  Never had colonoscopy.  No colon cancer in family.  Review of systems: Pertinent positive and negative review of systems were noted in the above HPI section. Complete review of systems was performed and was otherwise normal.    Past Medical History  Diagnosis Date  . Hyperlipidemia   . CAD (coronary artery disease)     Remote PCI; s/p CABG x 3 in Feb 2013  . Lung cancer 07/2000    status post right lower lobectomy for adenocarcinoma  . Heart murmur   . COPD (chronic obstructive pulmonary disease)   . Pneumonia   . Hypothyroidism   . Diabetes mellitus   . GERD (gastroesophageal reflux disease)   . Obesity     Past Surgical History  Procedure Laterality Date  . Inguinal hernia repair  1960's    "? side"  . Tonsillectomy and adenoidectomy  1950's  . Lung lobectomy      right, lower; "for lung cancer"  . Coronary angioplasty  1990's  . Coronary artery bypass graft  03/18/2011    Procedure: CORONARY ARTERY BYPASS GRAFTING (CABG);  Surgeon: Kathlee Nations Suann Larry, MD;  Location: [facility] OR;  Service: Open Heart Surgery;  Laterality: N/A;    Current Outpatient Prescriptions  Medication Sig Dispense Refill  . ADVAIR DISKUS 250-50 MCG/DOSE AEPB INHALE 1 PUFF BY MOUTH TWICE DAILY  60 each  2  . aspirin 325 MG tablet Take 325 mg by mouth daily.      Marland Kitchen guaiFENesin (MUCINEX) 600 MG 12 hr tablet Take 600 mg by mouth 2 (two) times daily as needed. For congestion      . ibuprofen (ADVIL,MOTRIN) 600 MG tablet Take 600 mg by mouth as needed.      Marland Kitchen levothyroxine (SYNTHROID, LEVOTHROID) 125 MCG tablet Take 125 mcg by mouth daily.        Marland Kitchen  lisinopril-hydrochlorothiazide (PRINZIDE,ZESTORETIC) 20-25 MG per tablet 1/2 tab po qd      . loratadine (CLARITIN) 10 MG tablet Take 10 mg by mouth daily as needed.       . metFORMIN (GLUCOPHAGE) 1000 MG tablet Take 1,000 mg by mouth 2 (two) times daily with a meal.      . nitroGLYCERIN (NITROSTAT) 0.4 MG SL tablet Place 0.4 mg under the tongue every 5 (five) minutes as needed. For chest pain      . rosuvastatin (CRESTOR) 5 MG tablet Take 5 mg by mouth every Monday, Wednesday, and Friday.       No current facility-administered medications for this visit.    Allergies as of 01/09/2013 - Review Complete 01/09/2013  Allergen Reaction Noted  . Statins  02/17/2012  . Sulfonamide derivatives Other (See Comments)     Family History  Problem Relation Age of Onset  . Emphysema    . Lung cancer      History   Social History  . Marital Status: Married    Spouse Name: N/A    Number of Children: N/A  . Years of Education: N/A   Occupational History  . Not on file.   Social History Main Topics  .  Smoking status: Current Some Day Smoker -- 1.50 packs/day for 50 years    Types: Cigarettes    Last Attempt to Quit: 03/14/2011  . Smokeless tobacco: Never Used     Comment: PATIENT USES VAPOR CIG  . Alcohol Use: No  . Drug Use: No  . Sexual Activity: Not Currently   Other Topics Concern  . Not on file   Social History Narrative  . No narrative on file       Physical Exam: BP 134/76  Pulse 80  Ht 5\' 4"  (1.626 m)  Wt 207 lb (93.895 kg)  BMI 35.51 kg/m2 Constitutional: generally well-appearing Psychiatric: alert and oriented x3 Eyes: extraocular movements intact Mouth: oral pharynx moist, no lesions Neck: supple no lymphadenopathy Cardiovascular: heart regular rate and rhythm Lungs: clear to auscultation bilaterally Abdomen: soft, nontender, nondistended, no obvious ascites, no peritoneal signs, normal bowel sounds Extremities: no lower extremity edema bilaterally Skin: no  lesions on visible extremities    Assessment and plan: 67 y.o. male with  Hemoccult-positive stool  At first he really only wanted to have another Hemoccult test. If it was positive he would agree to colonoscopy, if it was a negative and he preferred not to go ahead with colonoscopy. I explained to him that my recommendation would still be the same either another Hemoccult test was negative. The fact that he had one set positive already to me in that he should undergo full colonoscopy. After having a very nice discussion with him we agreed to go ahead and schedule a colonoscopy but he would like to wait through the holiday season. He is under a lot of stress with an ill wife currently. For now we will plan on colonoscopy in mid January.

## 2013-01-09 NOTE — Patient Instructions (Addendum)
You will be set up for a colonoscopy for hemocult positive stool (will look for mid January time spot).  You have been scheduled for a colonoscopy. Please follow written instructions given to you at your visit today.  Please pick up your prep kit at the pharmacy within the next 1-3 days. If you use inhalers (even only as needed), please bring them with you on the day of your procedure. Your physician has requested that you go to www.startemmi.com and enter the access code given to you at your visit today. This web site gives a general overview about your procedure. However, you should still follow specific instructions given to you by our office regarding your preparation for the procedure.  _ X _   ORAL DIABETIC MEDICATION INSTRUCTIONS  The day before your procedure:  Take your diabetic pill as you do normally  The day of your procedure:  Do not take your diabetic pill   We will check your blood sugar levels during the admission process and again in Recovery before discharging you home

## 2013-01-10 ENCOUNTER — Encounter: Payer: Self-pay | Admitting: Gastroenterology

## 2013-02-12 ENCOUNTER — Encounter: Payer: Self-pay | Admitting: Pulmonary Disease

## 2013-02-12 ENCOUNTER — Ambulatory Visit (INDEPENDENT_AMBULATORY_CARE_PROVIDER_SITE_OTHER): Payer: Medicare Other | Admitting: Pulmonary Disease

## 2013-02-12 VITALS — BP 122/78 | HR 72 | Temp 97.8°F | Ht 64.0 in | Wt 210.0 lb

## 2013-02-12 DIAGNOSIS — J449 Chronic obstructive pulmonary disease, unspecified: Secondary | ICD-10-CM

## 2013-02-12 NOTE — Assessment & Plan Note (Signed)
The patient overall is doing very well on Advair, and he has cut way back on his smoking by using a vapor cigarette.  He will sometimes go days without smoking, and then may smoke one to 2 on other days. I've encouraged him to work aggressively towards total smoking cessation. I have also asked him to work on aggressive weight loss and some type of conditioning program.

## 2013-02-12 NOTE — Progress Notes (Signed)
   Subjective:    Patient ID: William Dyer, male    DOB: [birth year], 68 y.o.   MRN: [id number]  HPI The patient comes in today for followup of his known COPD. He has been staying on his Advair compliantly, and has not had an acute exacerbation or pulmonary infection since last visit. He feels that his breathing is at his usual baseline.   Review of Systems  Constitutional: Negative for fever and unexpected weight change.  HENT: Negative for congestion, dental problem, ear pain, nosebleeds, postnasal drip, rhinorrhea, sinus pressure, sneezing, sore throat and trouble swallowing.   Eyes: Negative for redness and itching.  Respiratory: Negative for cough, chest tightness, shortness of breath and wheezing.   Cardiovascular: Negative for palpitations and leg swelling.  Gastrointestinal: Negative for nausea and vomiting.  Genitourinary: Negative for dysuria.  Musculoskeletal: Negative for joint swelling.  Skin: Negative for rash.  Neurological: Negative for headaches.  Hematological: Does not bruise/bleed easily.  Psychiatric/Behavioral: Negative for dysphoric mood. The patient is not nervous/anxious.        Objective:   Physical Exam Obese male in no acute distress Nose without purulence or discharge noted Neck without lymphadenopathy or thyromegaly Chest with mildly decreased breath sounds, no wheezes Cardiac exam with regular rate and rhythm Lower extremities with minimal ankle edema, no cyanosis Alert and oriented, moves all 4 extremities.       Assessment & Plan:

## 2013-02-12 NOTE — Patient Instructions (Signed)
Continue on advair for now.  Check to see if symbicort 160/4.5, breo, or dulera 100/5 are any cheaper for you on your insurance plan.  Work on weight loss and increased activity.  followup with me in 67mos

## 2013-02-26 ENCOUNTER — Other Ambulatory Visit: Payer: Self-pay | Admitting: Pulmonary Disease

## 2013-02-26 ENCOUNTER — Other Ambulatory Visit (INDEPENDENT_AMBULATORY_CARE_PROVIDER_SITE_OTHER): Payer: Medicare Other

## 2013-02-26 DIAGNOSIS — E785 Hyperlipidemia, unspecified: Secondary | ICD-10-CM

## 2013-02-26 LAB — HEPATIC FUNCTION PANEL
ALT: 21 U/L (ref 0–53)
AST: 18 U/L (ref 0–37)
Albumin: 4.1 g/dL (ref 3.5–5.2)
Alkaline Phosphatase: 42 U/L (ref 39–117)
BILIRUBIN DIRECT: 0.1 mg/dL (ref 0.0–0.3)
Total Bilirubin: 1.1 mg/dL (ref 0.3–1.2)
Total Protein: 7.5 g/dL (ref 6.0–8.3)

## 2013-02-26 LAB — LDL CHOLESTEROL, DIRECT: Direct LDL: 171 mg/dL

## 2013-02-26 LAB — LIPID PANEL
Cholesterol: 311 mg/dL — ABNORMAL HIGH (ref 0–200)
HDL: 48.4 mg/dL (ref >39.00)
Total CHOL/HDL Ratio: 6
Triglycerides: 509 mg/dL — ABNORMAL HIGH (ref 0.0–149.0)
VLDL: 101.8 mg/dL — ABNORMAL HIGH (ref 0.0–40.0)

## 2013-02-27 ENCOUNTER — Encounter: Payer: Self-pay | Admitting: Cardiovascular Disease

## 2013-02-27 ENCOUNTER — Ambulatory Visit (INDEPENDENT_AMBULATORY_CARE_PROVIDER_SITE_OTHER): Payer: Medicare Other | Admitting: Cardiovascular Disease

## 2013-02-27 VITALS — BP 134/72 | HR 80 | Ht 64.0 in | Wt 209.0 lb

## 2013-02-27 DIAGNOSIS — I2510 Atherosclerotic heart disease of native coronary artery without angina pectoris: Secondary | ICD-10-CM

## 2013-02-27 MED ORDER — NITROGLYCERIN 0.4 MG SL SUBL: 0.4000 mg | SUBLINGUAL_TABLET | SUBLINGUAL | Status: DC | PRN

## 2013-02-27 MED ORDER — ROSUVASTATIN CALCIUM 5 MG PO TABS: 5.0000 mg | ORAL_TABLET | ORAL | Status: DC

## 2013-02-27 NOTE — Progress Notes (Signed)
Patient ID: William Dyer, male   DOB: 1945-04-12, 68 y.o.   MRN: [id number] This is a 68 year old male patient who has a history of coronary artery disease status post CABG x3 in February 2013. Saw PA 2/14 after spending 6 hours in the emergency room with left arm and shoulder pain. His troponins were negative x2 and EKG unchanged. The patient says his left shoulder and arm have been hurting off and on for a week . He says it comes and goes and is is with ibuprofen. He feels like he may have a shoulder problem but these are this is similar symptoms to when he had his bypass. He denies any chest tightness, pressure, palpitations, dizziness or presyncope. He has had some sweating spells but none associated with the arm pain. He denies any exertional symptoms. His arm hurts to move in certain ways and is biceps is tender to touch. He is on oxygen at night but has not been using it as often as he should. He smokes electronic cigs  Sees [facility] chest Xray 4/14 with basilar scarring no CA.     F/U myovue 03/23/12 Normal with EF 64%  1/15 LDL over 170        ROS: Denies fever, malais, weight loss, blurry vision, decreased visual acuity, cough, sputum, SOB, hemoptysis, pleuritic pain, palpitaitons, heartburn, abdominal pain, melena, lower extremity edema, claudication, or rash.  All other systems reviewed and negative  General: Affect appropriate Healthy:  appears stated age 38: normal Neck supple with no adenopathy JVP normal no bruits no thyromegaly Lungs clear with no wheezing and good diaphragmatic motion Heart:  S1/S2 no murmur, no rub, gallop or click PMI normal Abdomen: benighn, BS positve, no tenderness, no AAA no bruit.  No HSM or HJR Distal pulses intact with no bruits No edema Neuro non-focal Skin warm and dry No muscular weakness   Current Outpatient Prescriptions  Medication Sig Dispense Refill  . ADVAIR DISKUS 250-50 MCG/DOSE AEPB INHALE 1 PUFF BY MOUTH TWICE DAILY  60 each  5   . aspirin 325 MG tablet Take 325 mg by mouth daily.      Marland Kitchen guaiFENesin (MUCINEX) 600 MG 12 hr tablet Take 600 mg by mouth 2 (two) times daily as needed. For congestion      . ibuprofen (ADVIL,MOTRIN) 600 MG tablet Take 600 mg by mouth as needed.      Marland Kitchen levothyroxine (SYNTHROID, LEVOTHROID) 125 MCG tablet Take 125 mcg by mouth daily.        Marland Kitchen lisinopril-hydrochlorothiazide (PRINZIDE,ZESTORETIC) 20-25 MG per tablet 1/2 tab po qd      . loratadine (CLARITIN) 10 MG tablet Take 10 mg by mouth daily as needed.       . metFORMIN (GLUCOPHAGE) 1000 MG tablet Take 1,000 mg by mouth daily with breakfast.       . MOVIPREP 100 G SOLR Take 1 kit (200 g total) by mouth once.  1 kit  0  . nitroGLYCERIN (NITROSTAT) 0.4 MG SL tablet Place 0.4 mg under the tongue every 5 (five) minutes as needed. For chest pain      . rosuvastatin (CRESTOR) 5 MG tablet Take 5 mg by mouth every Monday, Wednesday, and Friday.       No current facility-administered medications for this visit.    Allergies  Statins and Sulfonamide derivatives  Electrocardiogram:  7/21  SR rate 76 normal   Assessment and Plan

## 2013-02-27 NOTE — Patient Instructions (Signed)
Your physician wants you to follow-up in:  6 MONTHS WITH DR NISHAN  You will receive a reminder letter in the mail two months in advance. If you don't receive a letter, please call our office to schedule the follow-up appointment. Your physician recommends that you continue on your current medications as directed. Please refer to the Current Medication list given to you today. 

## 2013-03-01 ENCOUNTER — Ambulatory Visit (INDEPENDENT_AMBULATORY_CARE_PROVIDER_SITE_OTHER): Payer: Medicare Other | Admitting: Pharmacist

## 2013-03-01 DIAGNOSIS — E785 Hyperlipidemia, unspecified: Secondary | ICD-10-CM

## 2013-03-01 NOTE — Patient Instructions (Signed)
Increase Crestor to 5mg  every day.  If you have any problems, please call Gay Filler or Ysidro Evert at [phone].    Recheck labs in 3 months.

## 2013-03-04 ENCOUNTER — Other Ambulatory Visit: Payer: Medicare Other | Admitting: Gastroenterology

## 2013-03-06 NOTE — Assessment & Plan Note (Signed)
Pt's cholesterol much worse since last visit.  TC- 311, TG- 509, HDL- 48, LDL- 171 (goal<70).  LFTs are WNL.  Pt has some limitations currently with diet and exercise given the time he has to take care of his wife.  He is willing to increase Crestor to 5mg  daily.  Will try this and encouraged as much healthy eating as he can.  Will recheck labs in 4 months.

## 2013-03-06 NOTE — Progress Notes (Signed)
HPI  Mr. Roblero is a 68 yo M pt of Dr. [name] here for follow-up for his hyperlipidemia.  He has a history of CAD s/p CABG x3 in February 2013.  He has been taking Crestor 23m three times a week (MWF). He reports that he has no muscle pain since starting this dose. He was out of his medication for about 2 weeks prior to his lab draw. He also has history of DM.  His fasting blood sugars are around 130 and pre-prandial at dinner are 90-95.  His wife was recently placed on hospice care and currently bedridden.  He is her primary caregiver.   Diet Pt cooks whatever he can for he and his wife.  Some common meals include spaghetti and chicken with dumplings.   Exercise Pt does not have a regular exercise routine, but he is active throughout the day taking care of his wife, who is on hospice.    Current Outpatient Prescriptions on File Prior to Visit  Medication Sig Dispense Refill  . ADVAIR DISKUS 250-50 MCG/DOSE AEPB INHALE 1 PUFF BY MOUTH TWICE DAILY  60 each  5  . aspirin 325 MG tablet Take 325 mg by mouth daily.      .Marland KitchenguaiFENesin (MUCINEX) 600 MG 12 hr tablet Take 600 mg by mouth 2 (two) times daily as needed. For congestion      . ibuprofen (ADVIL,MOTRIN) 600 MG tablet Take 600 mg by mouth as needed.      .Marland Kitchenlevothyroxine (SYNTHROID, LEVOTHROID) 125 MCG tablet Take 125 mcg by mouth daily.        .Marland Kitchenlisinopril-hydrochlorothiazide (PRINZIDE,ZESTORETIC) 20-25 MG per tablet 1/2 tab po qd      . loratadine (CLARITIN) 10 MG tablet Take 10 mg by mouth daily as needed.       . metFORMIN (GLUCOPHAGE) 1000 MG tablet Take 1,000 mg by mouth daily with breakfast.       . MOVIPREP 100 G SOLR Take 1 kit (200 g total) by mouth once.  1 kit  0  . nitroGLYCERIN (NITROSTAT) 0.4 MG SL tablet Place 1 tablet (0.4 mg total) under the tongue every 5 (five) minutes as needed. For chest pain  25 tablet  3  . rosuvastatin (CRESTOR) 5 MG tablet Take 1 tablet (5 mg total) by mouth every Monday, Wednesday, and Friday.  30  tablet  3   No current facility-administered medications on file prior to visit.    Allergies  Allergen Reactions  . Statins   . Sulfonamide Derivatives Other (See Comments)    Patient "drasws up" muscularly

## 2013-04-08 ENCOUNTER — Telehealth: Payer: Self-pay | Admitting: Gastroenterology

## 2013-04-08 DIAGNOSIS — K921 Melena: Secondary | ICD-10-CM

## 2013-04-09 MED ORDER — MOVIPREP 100 G PO SOLR
1.0000 | Freq: Once | ORAL | Status: DC
Start: 2013-04-09 — End: 2013-04-16

## 2013-04-09 NOTE — Telephone Encounter (Signed)
Pt prep was resent and he was re instructed all questions were answered and he will call with any further questions

## 2013-04-16 ENCOUNTER — Encounter: Payer: Self-pay | Admitting: Gastroenterology

## 2013-04-16 ENCOUNTER — Ambulatory Visit (AMBULATORY_SURGERY_CENTER): Payer: Medicare Other | Admitting: Gastroenterology

## 2013-04-16 VITALS — BP 119/74 | HR 68 | Temp 97.6°F | Resp 16 | Ht 64.0 in | Wt 209.0 lb

## 2013-04-16 DIAGNOSIS — D126 Benign neoplasm of colon, unspecified: Secondary | ICD-10-CM

## 2013-04-16 DIAGNOSIS — K5730 Diverticulosis of large intestine without perforation or abscess without bleeding: Secondary | ICD-10-CM

## 2013-04-16 DIAGNOSIS — K921 Melena: Secondary | ICD-10-CM

## 2013-04-16 LAB — GLUCOSE, CAPILLARY
Glucose-Capillary: 154 mg/dL — ABNORMAL HIGH (ref 70–99)
Glucose-Capillary: 151 mg/dL — ABNORMAL HIGH (ref 70–99)

## 2013-04-16 MED ORDER — SODIUM CHLORIDE 0.9 % IV SOLN: 500.0000 mL | INTRAVENOUS | Status: DC

## 2013-04-16 NOTE — Progress Notes (Signed)
A/ox3 pleased with MAC, report to Karen RN 

## 2013-04-16 NOTE — Patient Instructions (Signed)
YOU HAD AN ENDOSCOPIC PROCEDURE TODAY AT THE [facility]: Refer to the procedure report that was given to you for any specific questions about what was found during the examination.  If the procedure report does not answer your questions, please call your gastroenterologist to clarify.  If you requested that your care partner not be given the details of your procedure findings, then the procedure report has been included in a sealed envelope for you to review at your convenience later.  YOU SHOULD EXPECT: Some feelings of bloating in the abdomen. Passage of more gas than usual.  Walking can help get rid of the air that was put into your GI tract during the procedure and reduce the bloating. If you had a lower endoscopy (such as a colonoscopy or flexible sigmoidoscopy) you may notice spotting of blood in your stool or on the toilet paper. If you underwent a bowel prep for your procedure, then you may not have a normal bowel movement for a few days.  DIET: Your first meal following the procedure should be a light meal and then it is ok to progress to your normal diet.  A half-sandwich or bowl of soup is an example of a good first meal.  Heavy or fried foods are harder to digest and may make you feel nauseous or bloated.  Likewise meals heavy in dairy and vegetables can cause extra gas to form and this can also increase the bloating.  Drink plenty of fluids but you should avoid alcoholic beverages for 24 hours.  ACTIVITY: Your care partner should take you home directly after the procedure.  You should plan to take it easy, moving slowly for the rest of the day.  You can resume normal activity the day after the procedure however you should NOT DRIVE or use heavy machinery for 24 hours (because of the sedation medicines used during the test).    SYMPTOMS TO REPORT IMMEDIATELY: A gastroenterologist can be reached at any hour.  During normal business hours, 8:30 AM to 5:00 PM Monday through Friday,  call [phone].  After hours and on weekends, please call the GI answering service at [phone] who will take a message and have the physician on call contact you.   Following lower endoscopy (colonoscopy or flexible sigmoidoscopy):  Excessive amounts of blood in the stool  Significant tenderness or worsening of abdominal pains  Swelling of the abdomen that is new, acute  Fever of 100F or higher    FOLLOW UP: If any biopsies were taken you will be contacted by phone or by letter within the next 1-3 weeks.  Call your gastroenterologist if you have not heard about the biopsies in 3 weeks.  Our staff will call the home number listed on your records the next business day following your procedure to check on you and address any questions or concerns that you may have at that time regarding the information given to you following your procedure. This is a courtesy call and so if there is no answer at the home number and we have not heard from you through the emergency physician on call, we will assume that you have returned to your regular daily activities without incident.  SIGNATURES/CONFIDENTIALITY: You and/or your care partner have signed paperwork which will be entered into your electronic medical record.  These signatures attest to the fact that that the information above on your After Visit Summary has been reviewed and is understood.  Full responsibility of the confidentiality   of this discharge information lies with you and/or your care-partner.     

## 2013-04-16 NOTE — Op Note (Signed)
[place]  [address] [place], [id number]   COLONOSCOPY PROCEDURE REPORT  PATIENT: William Dyer, William Dyer  MR#: [id number] BIRTHDATE: 1945-07-30 , 49  yrs. old GENDER: Male ENDOSCOPIST: Milus Banister, MD REFERRED [place]:[id number] Arelia Sneddon, M.D. PROCEDURE DATE:  04/16/2013 PROCEDURE:   Colonoscopy with snare polypectomy First Screening Colonoscopy - Avg.  risk and is 50 yrs.  old or older - No.  Prior Negative Screening - Now for repeat screening. N/A  History of Adenoma - Now for follow-up colonoscopy & has been > or = to 3 yrs.  N/A  Polyps Removed Today? Yes. ASA CLASS:   Class II INDICATIONS:FOBT + stool. MEDICATIONS: MAC sedation, administered by CRNA and Propofol (Diprivan) 170 mg IV  DESCRIPTION OF PROCEDURE:   After the risks benefits and alternatives of the procedure were thoroughly explained, informed consent was obtained.  A digital rectal exam revealed no abnormalities of the rectum.   The [id number]  endoscope was introduced through the anus and advanced to the cecum, which was identified by both the appendix and ileocecal valve. No adverse events experienced.   The quality of the prep was excellent.  The instrument was then slowly withdrawn as the colon was fully examined.  COLON FINDINGS: Two polyps were found, removed and sent to pathology.  These were both sessile, located in sigmoid segment, 2-71mm across, removed with cold snare.  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  The examination was otherwise normal.  Retroflexed views revealed no abnormalities. The time to cecum=1 minutes 46 seconds.  Withdrawal time=10 minutes 35 seconds.  The scope was withdrawn and the procedure completed. COMPLICATIONS: There were no complications.  ENDOSCOPIC IMPRESSION: Two polyps were found, removed and sent to pathology. 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  The examination was otherwise  normal.  RECOMMENDATIONS: If the polyp(s) removed today are proven to be adenomatous (pre-cancerous) polyps, you will need a repeat colonoscopy in 5 years.  Otherwise you should continue to follow colorectal cancer screening guidelines for "routine risk" patients with colonoscopy in 10 years.  You will receive a letter within 1-2 weeks with the results of your biopsy as well as final recommendations.  Please call my office if you have not received a letter after 3 weeks.   eSigned:  Milus Banister, MD 04/16/2013 9:16 AM

## 2013-04-16 NOTE — Progress Notes (Signed)
Called to room to assist during endoscopic procedure.  Patient ID and intended procedure confirmed with present staff. Received instructions for my participation in the procedure from the performing physician.  

## 2013-04-17 ENCOUNTER — Telehealth: Payer: Self-pay

## 2013-04-17 NOTE — Telephone Encounter (Signed)
  Follow up Call-  Call back number 04/16/2013  Post procedure Call Back phone  # [phone]  Permission to leave phone message Yes     Patient questions:  Do you have a fever, pain , or abdominal swelling? no Pain Score  0 *  Have you tolerated food without any problems? yes  Have you been able to return to your normal activities? yes  Do you have any questions about your discharge instructions: Diet   no Medications  no Follow up visit  no  Do you have questions or concerns about your Care? no  Actions: * If pain score is 4 or above: No action needed, pain <4.

## 2013-04-22 ENCOUNTER — Telehealth: Payer: Self-pay | Admitting: Pulmonary Disease

## 2013-04-22 MED ORDER — ALBUTEROL SULFATE HFA 108 (90 BASE) MCG/ACT IN AERS: 2.0000 | INHALATION_SPRAY | Freq: Four times a day (QID) | RESPIRATORY_TRACT | Status: DC | PRN

## 2013-04-22 NOTE — Telephone Encounter (Signed)
We do not have a rescue inhaler on pt's current medication list. There are several on his history medications. Advised pt that we would check with [facility] and get back with him.  KC - is it okay to send in a rescue inhaler for the pt?

## 2013-04-22 NOTE — Telephone Encounter (Signed)
Definitely needs a rescue inhaler with refills. Thanks.

## 2013-04-22 NOTE — Telephone Encounter (Signed)
Albuterol inhaler has been sent in per [facility]. Pt is aware. Nothing further was needed.

## 2013-04-24 ENCOUNTER — Encounter: Payer: Self-pay | Admitting: Gastroenterology

## 2013-05-12 ENCOUNTER — Encounter (HOSPITAL_COMMUNITY): Payer: Self-pay | Admitting: Emergency Medicine

## 2013-05-12 ENCOUNTER — Emergency Department (HOSPITAL_COMMUNITY): Payer: Medicare Other

## 2013-05-12 DIAGNOSIS — E039 Hypothyroidism, unspecified: Secondary | ICD-10-CM | POA: Diagnosis present

## 2013-05-12 DIAGNOSIS — E871 Hypo-osmolality and hyponatremia: Secondary | ICD-10-CM | POA: Diagnosis present

## 2013-05-12 DIAGNOSIS — I2510 Atherosclerotic heart disease of native coronary artery without angina pectoris: Secondary | ICD-10-CM | POA: Diagnosis present

## 2013-05-12 DIAGNOSIS — Z902 Acquired absence of lung [part of]: Secondary | ICD-10-CM

## 2013-05-12 DIAGNOSIS — E669 Obesity, unspecified: Secondary | ICD-10-CM | POA: Diagnosis present

## 2013-05-12 DIAGNOSIS — Z951 Presence of aortocoronary bypass graft: Secondary | ICD-10-CM

## 2013-05-12 DIAGNOSIS — J441 Chronic obstructive pulmonary disease with (acute) exacerbation: Principal | ICD-10-CM | POA: Diagnosis present

## 2013-05-12 DIAGNOSIS — Z85118 Personal history of other malignant neoplasm of bronchus and lung: Secondary | ICD-10-CM

## 2013-05-12 DIAGNOSIS — Z7982 Long term (current) use of aspirin: Secondary | ICD-10-CM

## 2013-05-12 DIAGNOSIS — J9600 Acute respiratory failure, unspecified whether with hypoxia or hypercapnia: Secondary | ICD-10-CM | POA: Diagnosis present

## 2013-05-12 DIAGNOSIS — E785 Hyperlipidemia, unspecified: Secondary | ICD-10-CM | POA: Diagnosis present

## 2013-05-12 DIAGNOSIS — I959 Hypotension, unspecified: Secondary | ICD-10-CM | POA: Diagnosis present

## 2013-05-12 DIAGNOSIS — Z79899 Other long term (current) drug therapy: Secondary | ICD-10-CM

## 2013-05-12 DIAGNOSIS — N179 Acute kidney failure, unspecified: Secondary | ICD-10-CM | POA: Diagnosis not present

## 2013-05-12 DIAGNOSIS — F17200 Nicotine dependence, unspecified, uncomplicated: Secondary | ICD-10-CM | POA: Diagnosis present

## 2013-05-12 DIAGNOSIS — I10 Essential (primary) hypertension: Secondary | ICD-10-CM | POA: Diagnosis present

## 2013-05-12 DIAGNOSIS — Z9861 Coronary angioplasty status: Secondary | ICD-10-CM

## 2013-05-12 DIAGNOSIS — E119 Type 2 diabetes mellitus without complications: Secondary | ICD-10-CM | POA: Diagnosis present

## 2013-05-12 LAB — I-STAT CHEM 8, ED
BUN: 10 mg/dL (ref 6–23)
Calcium, Ion: 1.15 mmol/L (ref 1.13–1.30)
Chloride: 91 mEq/L — ABNORMAL LOW (ref 96–112)
Creatinine, Ser: 0.9 mg/dL (ref 0.50–1.35)
Glucose, Bld: 144 mg/dL — ABNORMAL HIGH (ref 70–99)
HEMATOCRIT: 46 % (ref 39.0–52.0)
HEMOGLOBIN: 15.6 g/dL (ref 13.0–17.0)
POTASSIUM: 4 meq/L (ref 3.7–5.3)
SODIUM: 130 meq/L — AB (ref 137–147)
TCO2: 29 mmol/L (ref 0–100)

## 2013-05-12 LAB — CBC WITH DIFFERENTIAL/PLATELET
BASOS ABS: 0 10*3/uL (ref 0.0–0.1)
BASOS PCT: 0 % (ref 0–1)
EOS ABS: 0 10*3/uL (ref 0.0–0.7)
Eosinophils Relative: 0 % (ref 0–5)
HCT: 40.8 % (ref 39.0–52.0)
Hemoglobin: 14.4 g/dL (ref 13.0–17.0)
Lymphocytes Relative: 8 % — ABNORMAL LOW (ref 12–46)
Lymphs Abs: 1.5 10*3/uL (ref 0.7–4.0)
MCH: 32.3 pg (ref 26.0–34.0)
MCHC: 35.3 g/dL (ref 30.0–36.0)
MCV: 91.5 fL (ref 78.0–100.0)
Monocytes Absolute: 1.6 10*3/uL — ABNORMAL HIGH (ref 0.1–1.0)
Monocytes Relative: 8 % (ref 3–12)
NEUTROS PCT: 84 % — AB (ref 43–77)
Neutro Abs: 15.5 10*3/uL — ABNORMAL HIGH (ref 1.7–7.7)
PLATELETS: 209 10*3/uL (ref 150–400)
RBC: 4.46 MIL/uL (ref 4.22–5.81)
RDW: 14 % (ref 11.5–15.5)
WBC: 18.6 10*3/uL — ABNORMAL HIGH (ref 4.0–10.5)

## 2013-05-12 NOTE — ED Notes (Signed)
Pt c/o shortness of breath, onset 3 days ago but got worse today.  Pt is on home 02 at 1 1/2 LPM but st's he had to turn it up to 3LPM.  Productive cough yellow.   Pt also st's he started back smoking after his wife died on 04/01/22.  Pt has had breathing tx's at home with no relief.  Pt denies any chest pain at this time

## 2013-05-13 ENCOUNTER — Encounter (HOSPITAL_COMMUNITY): Payer: Self-pay | Admitting: *Deleted

## 2013-05-13 ENCOUNTER — Inpatient Hospital Stay (HOSPITAL_COMMUNITY)
Admission: EM | Admit: 2013-05-13 | Discharge: 2013-05-15 | DRG: 190 | Disposition: A | Discharge status: Home / Self Care | Payer: Medicare Other | Attending: Internal Medicine | Admitting: Internal Medicine

## 2013-05-13 DIAGNOSIS — I2510 Atherosclerotic heart disease of native coronary artery without angina pectoris: Secondary | ICD-10-CM

## 2013-05-13 DIAGNOSIS — E782 Mixed hyperlipidemia: Secondary | ICD-10-CM | POA: Diagnosis present

## 2013-05-13 DIAGNOSIS — I959 Hypotension, unspecified: Secondary | ICD-10-CM | POA: Diagnosis not present

## 2013-05-13 DIAGNOSIS — J189 Pneumonia, unspecified organism: Secondary | ICD-10-CM

## 2013-05-13 DIAGNOSIS — K219 Gastro-esophageal reflux disease without esophagitis: Secondary | ICD-10-CM | POA: Diagnosis present

## 2013-05-13 DIAGNOSIS — E669 Obesity, unspecified: Secondary | ICD-10-CM | POA: Diagnosis present

## 2013-05-13 DIAGNOSIS — E871 Hypo-osmolality and hyponatremia: Secondary | ICD-10-CM

## 2013-05-13 DIAGNOSIS — Z720 Tobacco use: Secondary | ICD-10-CM | POA: Diagnosis present

## 2013-05-13 DIAGNOSIS — J449 Chronic obstructive pulmonary disease, unspecified: Secondary | ICD-10-CM

## 2013-05-13 DIAGNOSIS — N179 Acute kidney failure, unspecified: Secondary | ICD-10-CM | POA: Diagnosis not present

## 2013-05-13 DIAGNOSIS — E039 Hypothyroidism, unspecified: Secondary | ICD-10-CM | POA: Diagnosis present

## 2013-05-13 DIAGNOSIS — Z87891 Personal history of nicotine dependence: Secondary | ICD-10-CM | POA: Insufficient documentation

## 2013-05-13 DIAGNOSIS — E119 Type 2 diabetes mellitus without complications: Secondary | ICD-10-CM

## 2013-05-13 DIAGNOSIS — J441 Chronic obstructive pulmonary disease with (acute) exacerbation: Secondary | ICD-10-CM | POA: Diagnosis present

## 2013-05-13 DIAGNOSIS — C3490 Malignant neoplasm of unspecified part of unspecified bronchus or lung: Secondary | ICD-10-CM | POA: Diagnosis present

## 2013-05-13 LAB — CBC
HEMATOCRIT: 39.7 % (ref 39.0–52.0)
Hemoglobin: 13.5 g/dL (ref 13.0–17.0)
MCH: 31.7 pg (ref 26.0–34.0)
MCHC: 34 g/dL (ref 30.0–36.0)
MCV: 93.2 fL (ref 78.0–100.0)
PLATELETS: 187 10*3/uL (ref 150–400)
RBC: 4.26 MIL/uL (ref 4.22–5.81)
RDW: 14.3 % (ref 11.5–15.5)
WBC: 18.2 10*3/uL — AB (ref 4.0–10.5)

## 2013-05-13 LAB — GLUCOSE, CAPILLARY
GLUCOSE-CAPILLARY: 361 mg/dL — AB (ref 70–99)
Glucose-Capillary: 165 mg/dL — ABNORMAL HIGH (ref 70–99)
Glucose-Capillary: 176 mg/dL — ABNORMAL HIGH (ref 70–99)
Glucose-Capillary: 358 mg/dL — ABNORMAL HIGH (ref 70–99)
Glucose-Capillary: 336 mg/dL — ABNORMAL HIGH (ref 70–99)

## 2013-05-13 LAB — PRO B NATRIURETIC PEPTIDE: Pro B Natriuretic peptide (BNP): 166.2 pg/mL — ABNORMAL HIGH (ref 0–125)

## 2013-05-13 LAB — HEMOGLOBIN A1C
Hgb A1c MFr Bld: 6.6 % — ABNORMAL HIGH (ref <5.7)
MEAN PLASMA GLUCOSE: 143 mg/dL — AB (ref <117)

## 2013-05-13 LAB — TSH: TSH: 1.38 u[IU]/mL (ref 0.350–4.500)

## 2013-05-13 LAB — CREATININE, SERUM: Creatinine, Ser: 0.76 mg/dL (ref 0.50–1.35)

## 2013-05-13 LAB — TROPONIN I

## 2013-05-13 MED ORDER — BIOTENE DRY MOUTH MT LIQD
15.0000 mL | Freq: Two times a day (BID) | OROMUCOSAL | Status: DC
  Administered 2013-05-13: 15 mL via OROMUCOSAL

## 2013-05-13 MED ORDER — SODIUM CHLORIDE 0.9 % IV BOLUS (SEPSIS)
500.0000 mL | Freq: Once | INTRAVENOUS | Status: AC
  Administered 2013-05-13: 500 mL via INTRAVENOUS

## 2013-05-13 MED ORDER — MORPHINE SULFATE 2 MG/ML IJ SOLN
2.0000 mg | Freq: Once | INTRAMUSCULAR | Status: AC
  Administered 2013-05-13: 2 mg via INTRAVENOUS
  Filled 2013-05-13: qty 1

## 2013-05-13 MED ORDER — SODIUM CHLORIDE 0.9 % IJ SOLN
3.0000 mL | Freq: Two times a day (BID) | INTRAMUSCULAR | Status: DC
  Administered 2013-05-13 (×2): 3 mL via INTRAVENOUS

## 2013-05-13 MED ORDER — LEVOFLOXACIN IN D5W 750 MG/150ML IV SOLN
750.0000 mg | INTRAVENOUS | Status: DC
Start: 2013-05-13 — End: 2013-05-14
  Administered 2013-05-13: 750 mg via INTRAVENOUS
  Filled 2013-05-13: qty 150

## 2013-05-13 MED ORDER — ALBUTEROL SULFATE (2.5 MG/3ML) 0.083% IN NEBU: 2.5000 mg | INHALATION_SOLUTION | RESPIRATORY_TRACT | Status: DC | PRN

## 2013-05-13 MED ORDER — LEVOFLOXACIN IN D5W 500 MG/100ML IV SOLN
500.0000 mg | Freq: Once | INTRAVENOUS | Status: AC
  Administered 2013-05-13: 500 mg via INTRAVENOUS
  Filled 2013-05-13: qty 100

## 2013-05-13 MED ORDER — LEVOTHYROXINE SODIUM 125 MCG PO TABS
125.0000 ug | ORAL_TABLET | Freq: Every day | ORAL | Status: DC
  Administered 2013-05-13 – 2013-05-15 (×3): 125 ug via ORAL
  Filled 2013-05-13 (×5): qty 1

## 2013-05-13 MED ORDER — LISINOPRIL 10 MG PO TABS
10.0000 mg | ORAL_TABLET | Freq: Every day | ORAL | Status: DC
  Administered 2013-05-13: 10 mg via ORAL
  Filled 2013-05-13 (×2): qty 1

## 2013-05-13 MED ORDER — NICOTINE 21 MG/24HR TD PT24
21.0000 mg | MEDICATED_PATCH | Freq: Every day | TRANSDERMAL | Status: DC
  Filled 2013-05-13 (×2): qty 1

## 2013-05-13 MED ORDER — DIPHENHYDRAMINE HCL 25 MG PO CAPS
25.0000 mg | ORAL_CAPSULE | Freq: Once | ORAL | Status: AC
  Administered 2013-05-13: 25 mg via ORAL
  Filled 2013-05-13: qty 1

## 2013-05-13 MED ORDER — LORATADINE 10 MG PO TABS
10.0000 mg | ORAL_TABLET | Freq: Every day | ORAL | Status: DC | PRN
  Filled 2013-05-13: qty 1

## 2013-05-13 MED ORDER — METFORMIN HCL 500 MG PO TABS
1000.0000 mg | ORAL_TABLET | Freq: Every day | ORAL | Status: DC
  Filled 2013-05-13 (×2): qty 2

## 2013-05-13 MED ORDER — MENTHOL 3 MG MT LOZG
1.0000 | LOZENGE | OROMUCOSAL | Status: DC | PRN
  Administered 2013-05-13: 3 mg via ORAL
  Filled 2013-05-13: qty 9

## 2013-05-13 MED ORDER — METFORMIN HCL 500 MG PO TABS
1000.0000 mg | ORAL_TABLET | Freq: Every day | ORAL | Status: DC
  Filled 2013-05-13: qty 2

## 2013-05-13 MED ORDER — LISINOPRIL-HYDROCHLOROTHIAZIDE 10-12.5 MG PO TABS: 1.0000 | ORAL_TABLET | Freq: Every day | ORAL | Status: DC

## 2013-05-13 MED ORDER — INSULIN ASPART 100 UNIT/ML ~~LOC~~ SOLN
0.0000 [IU] | Freq: Three times a day (TID) | SUBCUTANEOUS | Status: DC
  Administered 2013-05-13 (×2): 20 [IU] via SUBCUTANEOUS
  Administered 2013-05-14: 15 [IU] via SUBCUTANEOUS
  Administered 2013-05-14: 7 [IU] via SUBCUTANEOUS
  Administered 2013-05-14: 20 [IU] via SUBCUTANEOUS
  Administered 2013-05-15: 7 [IU] via SUBCUTANEOUS
  Administered 2013-05-15: 4 [IU] via SUBCUTANEOUS

## 2013-05-13 MED ORDER — HYDROCHLOROTHIAZIDE 12.5 MG PO CAPS
12.5000 mg | ORAL_CAPSULE | Freq: Every day | ORAL | Status: DC
  Administered 2013-05-13: 12.5 mg via ORAL
  Filled 2013-05-13 (×2): qty 1

## 2013-05-13 MED ORDER — ONDANSETRON HCL 4 MG/2ML IJ SOLN
4.0000 mg | Freq: Once | INTRAMUSCULAR | Status: AC
  Administered 2013-05-13: 4 mg via INTRAVENOUS
  Filled 2013-05-13: qty 2

## 2013-05-13 MED ORDER — METHYLPREDNISOLONE SODIUM SUCC 40 MG IJ SOLR
40.0000 mg | Freq: Four times a day (QID) | INTRAMUSCULAR | Status: DC
Start: 2013-05-13 — End: 2013-05-14
  Administered 2013-05-13 – 2013-05-14 (×5): 40 mg via INTRAVENOUS
  Filled 2013-05-13 (×11): qty 1

## 2013-05-13 MED ORDER — HYDROCODONE-HOMATROPINE 5-1.5 MG/5ML PO SYRP
5.0000 mL | ORAL_SOLUTION | ORAL | Status: DC | PRN
  Administered 2013-05-13: 5 mL via ORAL
  Filled 2013-05-13: qty 5

## 2013-05-13 MED ORDER — ONDANSETRON HCL 4 MG PO TABS: 4.0000 mg | ORAL_TABLET | Freq: Four times a day (QID) | ORAL | Status: DC | PRN

## 2013-05-13 MED ORDER — MOMETASONE FURO-FORMOTEROL FUM 100-5 MCG/ACT IN AERO
2.0000 | INHALATION_SPRAY | Freq: Two times a day (BID) | RESPIRATORY_TRACT | Status: DC
  Administered 2013-05-13 – 2013-05-15 (×4): 2 via RESPIRATORY_TRACT
  Filled 2013-05-13: qty 8.8

## 2013-05-13 MED ORDER — ATORVASTATIN CALCIUM 10 MG PO TABS
10.0000 mg | ORAL_TABLET | Freq: Every day | ORAL | Status: DC
  Administered 2013-05-13 – 2013-05-14 (×2): 10 mg via ORAL
  Filled 2013-05-13 (×3): qty 1

## 2013-05-13 MED ORDER — ONDANSETRON HCL 4 MG/2ML IJ SOLN: 4.0000 mg | Freq: Four times a day (QID) | INTRAMUSCULAR | Status: DC | PRN

## 2013-05-13 MED ORDER — HEPARIN SODIUM (PORCINE) 5000 UNIT/ML IJ SOLN
5000.0000 [IU] | Freq: Three times a day (TID) | INTRAMUSCULAR | Status: DC
  Filled 2013-05-13 (×3): qty 1

## 2013-05-13 MED ORDER — METHYLPREDNISOLONE SODIUM SUCC 125 MG IJ SOLR
125.0000 mg | Freq: Once | INTRAMUSCULAR | Status: AC
  Administered 2013-05-13: 125 mg via INTRAVENOUS
  Filled 2013-05-13: qty 2

## 2013-05-13 MED ORDER — INSULIN ASPART 100 UNIT/ML ~~LOC~~ SOLN
0.0000 [IU] | Freq: Every day | SUBCUTANEOUS | Status: DC
  Administered 2013-05-13 – 2013-05-14 (×2): 4 [IU] via SUBCUTANEOUS

## 2013-05-13 MED ORDER — ENOXAPARIN SODIUM 40 MG/0.4ML ~~LOC~~ SOLN
40.0000 mg | SUBCUTANEOUS | Status: DC
  Administered 2013-05-13 – 2013-05-14 (×2): 40 mg via SUBCUTANEOUS
  Filled 2013-05-13 (×3): qty 0.4

## 2013-05-13 MED ORDER — ALBUTEROL SULFATE (2.5 MG/3ML) 0.083% IN NEBU
5.0000 mg | INHALATION_SOLUTION | Freq: Once | RESPIRATORY_TRACT | Status: AC
  Administered 2013-05-13: 5 mg via RESPIRATORY_TRACT
  Filled 2013-05-13: qty 6

## 2013-05-13 MED ORDER — IPRATROPIUM-ALBUTEROL 0.5-2.5 (3) MG/3ML IN SOLN
3.0000 mL | RESPIRATORY_TRACT | Status: DC
  Administered 2013-05-13 – 2013-05-14 (×6): 3 mL via RESPIRATORY_TRACT
  Filled 2013-05-13 (×5): qty 3

## 2013-05-13 MED ORDER — ASPIRIN 325 MG PO TABS
325.0000 mg | ORAL_TABLET | Freq: Every day | ORAL | Status: DC
  Administered 2013-05-13 – 2013-05-15 (×3): 325 mg via ORAL
  Filled 2013-05-13 (×3): qty 1

## 2013-05-13 NOTE — Progress Notes (Signed)
[facility] PROGRESS NOTE  CAZ WEAVER DTO:[id number] DOB: 04-Dec-1945 DOA: 05/13/2013 PCP: [facility] Downing, MD  Assessment/Plan: acute Hypoxic respiratory failure secondary to COPD exacerbation Monitor on tele. Patient tachy to 100-120s overnight.  continue IV solumedrol, scheduled nebs, inhalers. Added empiric levaquin  continue o2 via [place]  still smokes heavily and counseled strongly on cessation. Will order nicotine patch. He also reports not using nebulizer at home as he is unaware about its usefulness. Leucocytosis possibly from steroids. No infiltrates on CXR. Monitor closely  CAD with CABG X3  in 2013 Stable. Continue ASA, crestor  DM type 2  hold metformin. Continue SSI Check A1C  HTN stable. Continue home meds  tobacco abuse  counseled on cessation. Will order nicotine patch.   Hypothyroidism  continue synthroid  DVT prophylaxis: sq lovenox  Code Status: full code Family Communication:none at bedside Disposition Plan: home tomorrow if improved   Consultants:  none  Procedures:  none  Antibiotics:  IV levaquin  HPI/Subjective: Admission H&P reviewed. Patient reports feeling better since admission but still has dyspnea on exertion.  Objective: Filed Vitals:   05/13/13 0607  BP: 107/62  Pulse: 103  Temp: 97.9 F (36.6 C)  Resp: 18    Intake/Output Summary (Last 24 hours) at 05/13/13 0917 Last data filed at 05/13/13 9833  Gross per 24 hour  Intake    240 ml  Output      0 ml  Net    240 ml   Filed Weights   05/12/13 2219 05/13/13 0204  Weight: 93.895 kg (207 lb) 94.076 kg (207 lb 6.4 oz)    Exam:   General:  Elderly obese male in NAD  HEENT: no pallor, moist mucosa  Chest: diminished breath sounds b/l, no wheeze or crackles  CVS: S1&S2 normal, no MRG  Abd: soft, NT, ND, BS+  Ext: warm, no edema   CNS: AAOX3    Data Reviewed: Basic Metabolic Panel:  Recent Labs Lab 05/12/13 2255 05/13/13 0330  NA 130*  --    K 4.0  --   CL 91*  --   GLUCOSE 144*  --   BUN 10  --   CREATININE 0.90 0.76   Liver Function Tests: No results found for this basename: AST, ALT, ALKPHOS, BILITOT, PROT, ALBUMIN,  in the last 168 hours No results found for this basename: LIPASE, AMYLASE,  in the last 168 hours No results found for this basename: AMMONIA,  in the last 168 hours CBC:  Recent Labs Lab 05/12/13 2249 05/12/13 2255 05/13/13 0330  WBC 18.6*  --  18.2*  NEUTROABS 15.5*  --   --   HGB 14.4 15.6 13.5  HCT 40.8 46.0 39.7  MCV 91.5  --  93.2  PLT 209  --  187   Cardiac Enzymes:  Recent Labs Lab 05/13/13 0052  TROPONINI <0.30   BNP (last 3 results)  Recent Labs  05/13/13 0052  PROBNP 166.2*   CBG:  Recent Labs Lab 05/13/13 0214 05/13/13 0644  GLUCAP 165* 176*    No results found for this or any previous visit (from the past 240 hour(s)).   Studies: Dg Chest 2 View  05/12/2013   CLINICAL DATA:  Shortness of breath  EXAM: CHEST  2 VIEW  COMPARISON:  Prior radiograph from 05/29/2012  FINDINGS: Median sternotomy wires of underlying CABG markers again noted. Cardiac and mediastinal silhouettes are stable.  Lungs are mildly hypoinflated. Patchy and streaky bibasilar densities are stable as compared to the  prior exam, and most likely reflect chronic scarring. No definite focal infiltrate identified. There is no overt pulmonary edema. Blunting of the right costophrenic angle is stable relative to prior. No definite pleural effusion. Irregular biapical pleural thickening. No pneumothorax.  IMPRESSION: Stable appearance of the chest with patchy and streaky bibasilar densities, likely chronic scarring, and similar relative to prior radiograph from 05/29/2012. No acute cardiopulmonary abnormality identified.   Electronically Signed   By: Jeannine Boga M.D.   On: 05/12/2013 23:37    Scheduled Meds: . antiseptic oral rinse  15 mL Mouth Rinse BID  . aspirin  325 mg Oral Daily  . atorvastatin   10 mg Oral q1800  . heparin  5,000 Units Subcutaneous 3 times per day  . lisinopril  10 mg Oral Daily   And  . hydrochlorothiazide  12.5 mg Oral Daily  . insulin aspart  0-20 Units Subcutaneous TID WC  . insulin aspart  0-5 Units Subcutaneous QHS  . ipratropium-albuterol  3 mL Nebulization Q4H  . levofloxacin (LEVAQUIN) IV  750 mg Intravenous Q24H  . levothyroxine  125 mcg Oral QAC breakfast  . metFORMIN  1,000 mg Oral Q supper  . methylPREDNISolone (SOLU-MEDROL) injection  40 mg Intravenous Q6H  . mometasone-formoterol  2 puff Inhalation BID  . sodium chloride  3 mL Intravenous Q12H   Continuous Infusions:    Time spent: 25 minutes    Edwina Grossberg, [place]  [facility] Pager [phone]. If 7PM-7AM, please contact night-coverage at [email or web address], password [email or web address] 05/13/2013, 9:17 AM  LOS: 0 days

## 2013-05-13 NOTE — ED Provider Notes (Signed)
CSN: [id number]     Arrival date & time 05/12/13  2152 History   First MD Initiated Contact with Patient 05/13/13 0018     Chief Complaint  Patient presents with  . Shortness of Breath     (Consider location/radiation/quality/duration/timing/severity/associated sxs/prior Treatment) HPI Patient presents with 3 days of increasing shortness of breath, cough productive of yellow sputum, subjective fevers and chills. He has COPD and wears one half liters of oxygen chronically. He states it's had increased his oxygen intake to 3 L. Continue no new lower extremity swelling or pain. He denies chest pain at this time. He has used his inhalers at home with little relief. Patient has had no recent hospitalizations. Past Medical History  Diagnosis Date  . Hyperlipidemia   . CAD (coronary artery disease)     Remote PCI; s/p CABG x 3 in Feb 2013  . Lung cancer 07/2000    status post right lower lobectomy for adenocarcinoma  . Heart murmur   . COPD (chronic obstructive pulmonary disease)   . Pneumonia   . Hypothyroidism   . Diabetes mellitus   . GERD (gastroesophageal reflux disease)   . Obesity    Past Surgical History  Procedure Laterality Date  . Inguinal hernia repair  1960's    "? side"  . Tonsillectomy and adenoidectomy  1950's  . Lung lobectomy      right, lower; "for lung cancer"  . Coronary angioplasty  1990's  . Coronary artery bypass graft  03/18/2011    Procedure: CORONARY ARTERY BYPASS GRAFTING (CABG);  Surgeon: [name] [name], MD;  Location: [place];  Service: Open Heart Surgery;  Laterality: N/A;   Family History  Problem Relation Age of Onset  . Emphysema    . Lung cancer     History  Substance Use Topics  . Smoking status: Current Some Day Smoker -- 1.50 packs/day for 50 years    Types: Cigarettes    Last Attempt to Quit: 03/14/2011  . Smokeless tobacco: Never Used     Comment: PATIENT USES VAPOR CIG  . Alcohol Use: No    Review of Systems  Constitutional:  Positive for fever, chills and fatigue.  Respiratory: Positive for cough, shortness of breath and wheezing.   Cardiovascular: Negative for chest pain, palpitations and leg swelling.  Gastrointestinal: Negative for nausea, vomiting and abdominal pain.  Musculoskeletal: Negative for back pain, myalgias, neck pain and neck stiffness.  Skin: Negative for rash and wound.  Neurological: Negative for dizziness, tremors, syncope, weakness and numbness.  All other systems reviewed and are negative.      Allergies  Statins and Sulfonamide derivatives  Home Medications   Current Outpatient Rx  Name  Route  Sig  Dispense  Refill  . ADVAIR DISKUS 250-50 MCG/DOSE AEPB      INHALE 1 PUFF BY MOUTH TWICE DAILY   60 each   5   . albuterol (PROVENTIL HFA;VENTOLIN HFA) 108 (90 BASE) MCG/ACT inhaler   Inhalation   Inhale 2 puffs into the lungs every 6 (six) hours as needed for wheezing or shortness of breath.   1 Inhaler   5   . aspirin 325 MG tablet   Oral   Take 325 mg by mouth daily.         Marland Kitchen guaiFENesin (MUCINEX) 600 MG 12 hr tablet   Oral   Take 600 mg by mouth 2 (two) times daily as needed for to loosen phlegm. For congestion         .  ibuprofen (ADVIL,MOTRIN) 600 MG tablet   Oral   Take 600 mg by mouth as needed.         Marland Kitchen levothyroxine (SYNTHROID, LEVOTHROID) 125 MCG tablet   Oral   Take 125 mcg by mouth daily.           Marland Kitchen lisinopril-hydrochlorothiazide (PRINZIDE,ZESTORETIC) 10-12.5 MG per tablet   Oral   Take 1 tablet by mouth daily.         Marland Kitchen loratadine (CLARITIN) 10 MG tablet   Oral   Take 10 mg by mouth daily as needed.          . metFORMIN (GLUCOPHAGE) 1000 MG tablet   Oral   Take 1,000 mg by mouth daily with breakfast.          . rosuvastatin (CRESTOR) 5 MG tablet   Oral   Take 5 mg by mouth at bedtime.         . nitroGLYCERIN (NITROSTAT) 0.4 MG SL tablet   Sublingual   Place 1 tablet (0.4 mg total) under the tongue every 5 (five) minutes as  needed. For chest pain   25 tablet   3    BP 129/60  Pulse 108  Temp(Src) 98.3 F (36.8 C) (Oral)  Resp 27  Ht 5\' 4"  (1.626 m)  Wt 207 lb (93.895 kg)  BMI 35.51 kg/m2  SpO2 92% Physical Exam  Nursing note and vitals reviewed. Constitutional: He is oriented to person, place, and time. He appears well-developed and well-nourished.  HENT:  Head: Normocephalic and atraumatic.  Mouth/Throat: Oropharynx is clear and moist.  Eyes: EOM are normal. Pupils are equal, round, and reactive to light.  Neck: Normal range of motion. Neck supple.  Cardiovascular: Normal rate and regular rhythm.   Pulmonary/Chest: No respiratory distress. He has wheezes (mild diffuse expiratory wheezes.). He has rales (rhonchi in bilateral bases.).  Increase work of breathing. Use of accessory muscles.  Abdominal: Soft. Bowel sounds are normal. He exhibits no distension and no mass. There is no tenderness. There is no rebound and no guarding.  Musculoskeletal: Normal range of motion. He exhibits no edema and no tenderness.  No calf swelling or tenderness.  Neurological: He is alert and oriented to person, place, and time.  Moves all extremities without deficit. Sensation is grossly intact.  Skin: Skin is warm and dry. No rash noted. No erythema.  Psychiatric: He has a normal mood and affect. His behavior is normal.    ED Course  Procedures (including critical care time) Labs Review Labs Reviewed  CBC WITH DIFFERENTIAL - Abnormal; Notable for the following:    WBC 18.6 (*)    Neutrophils Relative % 84 (*)    Neutro Abs 15.5 (*)    Lymphocytes Relative 8 (*)    Monocytes Absolute 1.6 (*)    All other components within normal limits  I-STAT CHEM 8, ED - Abnormal; Notable for the following:    Sodium 130 (*)    Chloride 91 (*)    Glucose, Bld 144 (*)    All other components within normal limits  TROPONIN I  PRO B NATRIURETIC PEPTIDE   Imaging Review Dg Chest 2 View  05/12/2013   CLINICAL DATA:   Shortness of breath  EXAM: CHEST  2 VIEW  COMPARISON:  Prior radiograph from 05/29/2012  FINDINGS: Median sternotomy wires of underlying CABG markers again noted. Cardiac and mediastinal silhouettes are stable.  Lungs are mildly hypoinflated. Patchy and streaky bibasilar densities are stable as compared to  the prior exam, and most likely reflect chronic scarring. No definite focal infiltrate identified. There is no overt pulmonary edema. Blunting of the right costophrenic angle is stable relative to prior. No definite pleural effusion. Irregular biapical pleural thickening. No pneumothorax.  IMPRESSION: Stable appearance of the chest with patchy and streaky bibasilar densities, likely chronic scarring, and similar relative to prior radiograph from 05/29/2012. No acute cardiopulmonary abnormality identified.   Electronically Signed   By: Jeannine Boga M.D.   On: 05/12/2013 23:37     EKG Interpretation   Date/Time:  Sunday May 12 2013 22:36:45 EDT Ventricular Rate:  108 PR Interval:  166 QRS Duration: 104 QT Interval:  352 QTC Calculation: 471 R Axis:   107 Text Interpretation:  Sinus tachycardia Rightward axis Borderline ECG  Confirmed by Govani Radloff  MD, Karesha Trzcinski ([id number]) on 05/13/2013 12:43:21 AM      MDM   Final diagnoses:  None    Given elevated white blood cell count, x-ray findings the patient probably has been acquired pneumonia. Will give albuterol treatment and steroids as well as starting on antibiotics. You have increased oxygen requirements and clinical picture, patient will need to be admitted.  Discussed with [facility]. Will admit patient.  Julianne Rice, MD 05/13/13 [phone]

## 2013-05-13 NOTE — H&P (Signed)
[facility] History and Physical  William Dyer EHU:[id number] DOB: 04-27-1945    PCP:   [facility] Downing, MD   Chief Complaint: coughs and shortness of breath.  HPI: William Dyer is an 68 y.o. male with significant hx of tobacco use (50 plus pack year), hx of CAD, s/p CABGx3 about 2 years ago, active smoker since his wife died, obesity, DM2, hypothyroidism, hx of lung CA, s/p partial right lobectomy, presents to the ER with shortness of breath with wheezing and coughing for the past three days.  He was given Neb tx along with IV steroid bolus in the ER.   He had a leukocytosis with a WBC of 18K, but his CXR showed no definite infiltrate, just scarring.  His serum Na was 130, but his renal fx tests were normal.  He denied leg swelling, orthopnea, or PND.  Hospitalist was asked to admit him for COPD exacerbation with possible PNA.   Rewiew of Systems:  Constitutional: Negative for malaise, fever and chills. No significant weight loss or weight gain Eyes: Negative for eye pain, redness and discharge, diplopia, visual changes, or flashes of light. ENMT: Negative for ear pain, hoarseness, nasal congestion, sinus pressure and sore throat. No headaches; tinnitus, drooling, or problem swallowing. Cardiovascular: Negative for chest pain, palpitations, diaphoresis, dyspnea and peripheral edema. ; No orthopnea, PND Respiratory: Negative for  Hemoptysis. No pleuritic chestpain. Gastrointestinal: Negative for nausea, vomiting, diarrhea, constipation, abdominal pain, melena, blood in stool, hematemesis, jaundice and rectal bleeding.    Genitourinary: Negative for frequency, dysuria, incontinence,flank pain and hematuria; Musculoskeletal: Negative for back pain and neck pain. Negative for swelling and trauma.;  Skin: . Negative for pruritus, rash, abrasions, bruising and skin lesion.; ulcerations Neuro: Negative for headache, lightheadedness and neck stiffness. Negative for weakness, altered  level of consciousness , altered mental status, extremity weakness, burning feet, involuntary movement, seizure and syncope.  Psych: negative for anxiety, depression, insomnia, tearfulness, panic attacks, hallucinations, paranoia, suicidal or homicidal ideation    Past Medical History  Diagnosis Date  . Hyperlipidemia   . CAD (coronary artery disease)     Remote PCI; s/p CABG x 3 in Feb 2013  . Lung cancer 07/2000    status post right lower lobectomy for adenocarcinoma  . Heart murmur   . COPD (chronic obstructive pulmonary disease)   . Pneumonia   . Hypothyroidism   . Diabetes mellitus   . GERD (gastroesophageal reflux disease)   . Obesity     Past Surgical History  Procedure Laterality Date  . Inguinal hernia repair  1960's    "? side"  . Tonsillectomy and adenoidectomy  1950's  . Lung lobectomy      right, lower; "for lung cancer"  . Coronary angioplasty  1990's  . Coronary artery bypass graft  03/18/2011    Procedure: CORONARY ARTERY BYPASS GRAFTING (CABG);  Surgeon: [name] [name], MD;  Location: [place];  Service: Open Heart Surgery;  Laterality: N/A;    Medications:  HOME MEDS: Prior to Admission medications   Medication Sig Start Date End Date Taking? Authorizing Provider  ADVAIR DISKUS 250-50 MCG/DOSE AEPB INHALE 1 PUFF BY MOUTH TWICE DAILY 02/26/13  Yes Kathee Delton, MD  albuterol (PROVENTIL HFA;VENTOLIN HFA) 108 (90 BASE) MCG/ACT inhaler Inhale 2 puffs into the lungs every 6 (six) hours as needed for wheezing or shortness of breath. 04/22/13  Yes Kathee Delton, MD  aspirin 325 MG tablet Take 325 mg by mouth daily.   Yes  Historical Provider, MD  guaiFENesin (MUCINEX) 600 MG 12 hr tablet Take 600 mg by mouth 2 (two) times daily as needed for to loosen phlegm. For congestion   Yes Historical Provider, MD  ibuprofen (ADVIL,MOTRIN) 600 MG tablet Take 600 mg by mouth as needed.   Yes Historical Provider, MD  levothyroxine (SYNTHROID, LEVOTHROID) 125 MCG tablet Take 125  mcg by mouth daily.     Yes Historical Provider, MD  lisinopril-hydrochlorothiazide (PRINZIDE,ZESTORETIC) 10-12.5 MG per tablet Take 1 tablet by mouth daily.   Yes Historical Provider, MD  loratadine (CLARITIN) 10 MG tablet Take 10 mg by mouth daily as needed.    Yes Historical Provider, MD  metFORMIN (GLUCOPHAGE) 1000 MG tablet Take 1,000 mg by mouth daily with breakfast.    Yes Historical Provider, MD  rosuvastatin (CRESTOR) 5 MG tablet Take 5 mg by mouth at bedtime. 02/27/13  Yes Josue Hector, MD  nitroGLYCERIN (NITROSTAT) 0.4 MG SL tablet Place 1 tablet (0.4 mg total) under the tongue every 5 (five) minutes as needed. For chest pain 02/27/13   Josue Hector, MD     Allergies:  Allergies  Allergen Reactions  . Statins   . Sulfonamide Derivatives Other (See Comments)    Patient "drasws up" muscularly    Social History:   reports that he has been smoking Cigarettes.  He has a 75 pack-year smoking history. He has never used smokeless tobacco. He reports that he does not drink alcohol or use illicit drugs.  Family History: Family History  Problem Relation Age of Onset  . Emphysema    . Lung cancer       Physical Exam: Filed Vitals:   05/12/13 2219 05/13/13 0030  BP: 137/66 129/60  Pulse: 110 108  Temp: 98.3 F (36.8 C)   TempSrc: Oral   Resp: 20 27  Height: 5\' 4"  (1.626 m)   Weight: 93.895 kg (207 lb)   SpO2: 94% 92%   Blood pressure 129/60, pulse 108, temperature 98.3 F (36.8 C), temperature source Oral, resp. rate 27, height 5\' 4"  (1.626 m), weight 93.895 kg (207 lb), SpO2 92.00%.  GEN:  Pleasant  patient lying in the stretcher in no acute distress; cooperative with exam. PSYCH:  alert and oriented x4; does not appear anxious or depressed; affect is appropriate. HEENT: Mucous membranes pink and anicteric; PERRLA; EOM intact; no cervical lymphadenopathy nor thyromegaly or carotid bruit; no JVD; There were no stridor. Neck is very supple. Breasts:: Not examined CHEST  WALL: No tenderness CHEST: Normal respiration, He has bilateral wheezing with slight crackles on the left field. HEART: Regular rate and rhythm.  There are no murmur, rub, or gallops.   BACK: No kyphosis or scoliosis; no CVA tenderness ABDOMEN: soft and non-tender; no masses, no organomegaly, normal abdominal bowel sounds; no pannus; no intertriginous candida. There is no rebound and no distention. Rectal Exam: Not done EXTREMITIES: No bone or joint deformity; age-appropriate arthropathy of the hands and knees; no edema; no ulcerations.  There is no calf tenderness. Genitalia: not examined PULSES: 2+ and symmetric SKIN: Normal hydration no rash or ulceration CNS: Cranial nerves 2-12 grossly intact no focal lateralizing neurologic deficit.  Speech is fluent; uvula elevated with phonation, facial symmetry and tongue midline. DTR are normal bilaterally, cerebella exam is intact, barbinski is negative and strengths are equaled bilaterally.  No sensory loss.   Labs on Admission:  Basic Metabolic Panel:  Recent Labs Lab 05/12/13 2255  NA 130*  K 4.0  CL 91*  GLUCOSE 144*  BUN 10  CREATININE 0.90   Liver Function Tests: No results found for this basename: AST, ALT, ALKPHOS, BILITOT, PROT, ALBUMIN,  in the last 168 hours No results found for this basename: LIPASE, AMYLASE,  in the last 168 hours No results found for this basename: AMMONIA,  in the last 168 hours CBC:  Recent Labs Lab 05/12/13 2249 05/12/13 2255  WBC 18.6*  --   NEUTROABS 15.5*  --   HGB 14.4 15.6  HCT 40.8 46.0  MCV 91.5  --   PLT 209  --    Cardiac Enzymes: No results found for this basename: CKTOTAL, CKMB, CKMBINDEX, TROPONINI,  in the last 168 hours  CBG: No results found for this basename: GLUCAP,  in the last 168 hours   Radiological Exams on Admission: Dg Chest 2 View  05/12/2013   CLINICAL DATA:  Shortness of breath  EXAM: CHEST  2 VIEW  COMPARISON:  Prior radiograph from 05/29/2012  FINDINGS: Median  sternotomy wires of underlying CABG markers again noted. Cardiac and mediastinal silhouettes are stable.  Lungs are mildly hypoinflated. Patchy and streaky bibasilar densities are stable as compared to the prior exam, and most likely reflect chronic scarring. No definite focal infiltrate identified. There is no overt pulmonary edema. Blunting of the right costophrenic angle is stable relative to prior. No definite pleural effusion. Irregular biapical pleural thickening. No pneumothorax.  IMPRESSION: Stable appearance of the chest with patchy and streaky bibasilar densities, likely chronic scarring, and similar relative to prior radiograph from 05/29/2012. No acute cardiopulmonary abnormality identified.   Electronically Signed   By: Jeannine Boga M.D.   On: 05/12/2013 23:37    Assessment/Plan Present on Admission:  . COPD with acute exacerbation . Tobacco abuse . Obesity . GERD (gastroesophageal reflux disease) . Hypothyroidism . Lung cancer . HYPERLIPIDEMIA . COPD with exacerbation  PLAN:  Will admit him for COPD exacerbation.  He could have PNA also, though CXR didn't show any definite infiltrate.  Will give IV steroids, frequent nebs, and IV antibiotics, and oxygen (currently at 3L [place]).  He must stop smoking and was told of such.  For his hypothyroidism, will continue supplement and check TSH.  He is stable, full code, and will be admitted to [facility] hospitalist service.  Thank you for allowing me to participate in the care of this nice patient.   Other plans as per orders.  Code Status: FULL Haskel Khan, MD. [facility] Pager [phone] 7pm to 7am.  05/13/2013, 1:37 AM

## 2013-05-13 NOTE — Care Management Note (Signed)
    Page 1 of 1   05/15/2013     11:56:56 AM   CARE MANAGEMENT NOTE 05/15/2013  Patient:  William Dyer, William Dyer   Account Number:  [id number]  Date Initiated:  05/13/2013  Documentation initiated by:  Tovia Kisner  Subjective/Objective Assessment:   PT ADM ON 05/12/13 WITH COPD EXAC, PNA.  PTA, PT INDEPENDENT; LIVES AT HOME AND IS ON CHRONIC OXYGEN.     Action/Plan:   WILL FOLLOW FOR DC NEEDS AS PT PROGRESSES.   Anticipated DC Date:  05/16/2013   Anticipated DC Plan:  [place]  CM consult      Choice offered to / List presented to:     DME arranged  [facility]      DME agency  [facility]        Status of service:  Completed, signed off Medicare Important Message given?   (If response is "NO", the following Medicare IM given date fields will be blank) Date Medicare IM given:   Date Additional Medicare IM given:    Discharge Disposition:  HOME/SELF CARE  Per UR Regulation:  Reviewed for med. necessity/level of care/duration of stay  If discussed at [place] of Stay Meetings, dates discussed:    Comments:  05/15/13 Evamarie Raetz,RN,BSN [phone] PT [place].  PTA, PT ON HOME OXYGEN AT 1.5L, BUT ONLY AT NIGHT.  NOW, ON 2L O2, CONT VIA [place].  NOTIFIED [facility] OF CHANGE IN LITER FLOW, AND PT'S DESIRE TO HAVE LIQUID O2 PORTABLE TANKS.  [facility] STATES THEY WILL REEVALUATE FOR LIQUID O2 WITHIN 1-2 DAYS OF DC.  PT VERB UNDERSTANDING OF THIS, STATES HE HAS PLENTY OF LARGE PORTABLE TANKS AT HOME; DAUGHTER IS BRINGING ONE FOR TRANSPORT HOME.

## 2013-05-14 DIAGNOSIS — J441 Chronic obstructive pulmonary disease with (acute) exacerbation: Principal | ICD-10-CM

## 2013-05-14 DIAGNOSIS — N179 Acute kidney failure, unspecified: Secondary | ICD-10-CM

## 2013-05-14 DIAGNOSIS — I959 Hypotension, unspecified: Secondary | ICD-10-CM | POA: Diagnosis not present

## 2013-05-14 DIAGNOSIS — E871 Hypo-osmolality and hyponatremia: Secondary | ICD-10-CM | POA: Diagnosis present

## 2013-05-14 LAB — URINALYSIS, ROUTINE W REFLEX MICROSCOPIC
Bilirubin Urine: NEGATIVE
GLUCOSE, UA: 500 mg/dL — AB
Hgb urine dipstick: NEGATIVE
Ketones, ur: NEGATIVE mg/dL
LEUKOCYTES UA: NEGATIVE
Nitrite: NEGATIVE
Protein, ur: NEGATIVE mg/dL
SPECIFIC GRAVITY, URINE: 1.021 (ref 1.005–1.030)
Urobilinogen, UA: 0.2 mg/dL (ref 0.0–1.0)
pH: 5 (ref 5.0–8.0)

## 2013-05-14 LAB — BASIC METABOLIC PANEL
BUN: 37 mg/dL — ABNORMAL HIGH (ref 6–23)
CHLORIDE: 87 meq/L — AB (ref 96–112)
CO2: 24 meq/L (ref 19–32)
Calcium: 8.8 mg/dL (ref 8.4–10.5)
Creatinine, Ser: 1.42 mg/dL — ABNORMAL HIGH (ref 0.50–1.35)
GFR calc Af Amer: 57 mL/min — ABNORMAL LOW (ref >90)
GFR calc non Af Amer: 49 mL/min — ABNORMAL LOW (ref >90)
Glucose, Bld: 232 mg/dL — ABNORMAL HIGH (ref 70–99)
Potassium: 4.7 mEq/L (ref 3.7–5.3)
SODIUM: 129 meq/L — AB (ref 137–147)

## 2013-05-14 LAB — CBC
HCT: 38.9 % — ABNORMAL LOW (ref 39.0–52.0)
HEMOGLOBIN: 13.1 g/dL (ref 13.0–17.0)
MCH: 31.3 pg (ref 26.0–34.0)
MCHC: 33.7 g/dL (ref 30.0–36.0)
MCV: 92.8 fL (ref 78.0–100.0)
Platelets: 214 10*3/uL (ref 150–400)
RBC: 4.19 MIL/uL — ABNORMAL LOW (ref 4.22–5.81)
RDW: 14 % (ref 11.5–15.5)
WBC: 22.4 10*3/uL — ABNORMAL HIGH (ref 4.0–10.5)

## 2013-05-14 LAB — GLUCOSE, CAPILLARY
GLUCOSE-CAPILLARY: 308 mg/dL — AB (ref 70–99)
Glucose-Capillary: 236 mg/dL — ABNORMAL HIGH (ref 70–99)
Glucose-Capillary: 370 mg/dL — ABNORMAL HIGH (ref 70–99)
Glucose-Capillary: 325 mg/dL — ABNORMAL HIGH (ref 70–99)

## 2013-05-14 LAB — CREATININE, URINE, RANDOM: CREATININE, URINE: 88.16 mg/dL

## 2013-05-14 LAB — SODIUM, URINE, RANDOM

## 2013-05-14 MED ORDER — DOCUSATE SODIUM 100 MG PO CAPS
100.0000 mg | ORAL_CAPSULE | Freq: Every day | ORAL | Status: DC | PRN
  Administered 2013-05-14: 100 mg via ORAL
  Filled 2013-05-14: qty 1

## 2013-05-14 MED ORDER — IPRATROPIUM-ALBUTEROL 0.5-2.5 (3) MG/3ML IN SOLN
3.0000 mL | Freq: Four times a day (QID) | RESPIRATORY_TRACT | Status: DC
  Administered 2013-05-14 – 2013-05-15 (×3): 3 mL via RESPIRATORY_TRACT
  Filled 2013-05-14 (×3): qty 3

## 2013-05-14 MED ORDER — SODIUM CHLORIDE 0.9 % IV SOLN
INTRAVENOUS | Status: AC
  Administered 2013-05-14 – 2013-05-15 (×3): via INTRAVENOUS

## 2013-05-14 MED ORDER — IPRATROPIUM-ALBUTEROL 0.5-2.5 (3) MG/3ML IN SOLN
3.0000 mL | Freq: Three times a day (TID) | RESPIRATORY_TRACT | Status: DC
  Administered 2013-05-14: 3 mL via RESPIRATORY_TRACT
  Filled 2013-05-14: qty 3

## 2013-05-14 MED ORDER — LEVOFLOXACIN IN D5W 750 MG/150ML IV SOLN
750.0000 mg | INTRAVENOUS | Status: DC
  Filled 2013-05-14: qty 150

## 2013-05-14 MED ORDER — POLYETHYLENE GLYCOL 3350 17 G PO PACK
17.0000 g | PACK | Freq: Every day | ORAL | Status: DC | PRN
  Filled 2013-05-14: qty 1

## 2013-05-14 MED ORDER — SENNOSIDES-DOCUSATE SODIUM 8.6-50 MG PO TABS
2.0000 | ORAL_TABLET | Freq: Every day | ORAL | Status: DC
  Administered 2013-05-15: 2 via ORAL
  Filled 2013-05-14 (×2): qty 2

## 2013-05-14 MED ORDER — PREDNISONE 20 MG PO TABS
40.0000 mg | ORAL_TABLET | Freq: Every day | ORAL | Status: DC
  Administered 2013-05-15: 40 mg via ORAL
  Filled 2013-05-14 (×2): qty 2

## 2013-05-14 NOTE — Progress Notes (Signed)
[facility] PROGRESS NOTE  William Dyer QIW:[id number] DOB: [birth year] DOA: 05/13/2013 PCP: Leonard Downing, MD   Brief narrative 68 y.o. male with significant hx of tobacco use (50 plus pack year), hx of CAD, s/p CABGx3 about 2 years ago, active smoker since his wife died, obesity, DM2, hypothyroidism, hx of lung CA, s/p partial right lobectomy, presented to the ER with shortness of breath with wheezing and coughing for the past three days with acute hypoxic respiratory failure secondary to COPD exacerbation.   Assessment/Plan: acute Hypoxic respiratory failure secondary to COPD exacerbation Monitor on tele. Patient tachy to 100-120s overnight.  continue IV scheduled nebs, inhalers. Added empiric levaquin( dose adjusted due to AKI). Switch to po prednisone  continue o2 via [place]  still smokes heavily and counseled strongly on cessation.  He also reports not using nebulizer at home as he is unaware about its usefulness. He will need nebulizer meds upon discharge ( has machine at home). Also needs spiriva. ( he is only on albuterol and advair inhaler)  Leucocytosis possibly from steroids. No infiltrates on CXR. Monitor closely  CAD with CABG X3  in 2013 Stable. Continue ASA, crestor  DM type 2  hold metformin. Continue SSI . 1C of 6.6  AKI Noted this am. Also has mild hyponatremia. Possibly prerenal. Check UA and urine lytes. IV NS . Hold HCTZ and lisinopril. levaquin dose adjusted.   HTN Low BP this am. Hold HCTZ and lisinopril. Start on IV NS2100 CC/ hr for 1 day  tobacco abuse  counseled on cessation. Refused nicotine patch.   Hypothyroidism  continue synthroid  DVT prophylaxis: sq lovenox  Code Status: full code Family Communication:none at bedside Disposition Plan: home tomorrow if renal fn improved   Consultants:  none  Procedures:  none  Antibiotics:  IV levaquin  HPI/Subjective: SOB better.   Objective: Filed Vitals:   05/14/13 0651  BP:  89/47  Pulse:   Temp:   Resp:     Intake/Output Summary (Last 24 hours) at 05/14/13 0834 Last data filed at 05/14/13 0746  Gross per 24 hour  Intake   1560 ml  Output      0 ml  Net   1560 ml   Filed Weights   05/12/13 2219 05/13/13 0204  Weight: 93.895 kg (207 lb) 94.076 kg (207 lb 6.4 oz)    Exam:   General:  Elderly obese male in NAD  HEENT: no pallor, moist mucosa  Chest: clear breath sounds b/l, fine bilateral basal crackles  CVS: S1&S2 normal, no MRG  Abd: soft, NT, ND, BS+  Ext: warm, no edema   CNS: AAOX3    Data Reviewed: Basic Metabolic Panel:  Recent Labs Lab 05/12/13 2255 05/13/13 0330 05/14/13 0550  NA 130*  --  129*  K 4.0  --  4.7  CL 91*  --  87*  CO2  --   --  24  GLUCOSE 144*  --  232*  BUN 10  --  37*  CREATININE 0.90 0.76 1.42*  CALCIUM  --   --  8.8   Liver Function Tests: No results found for this basename: AST, ALT, ALKPHOS, BILITOT, PROT, ALBUMIN,  in the last 168 hours No results found for this basename: LIPASE, AMYLASE,  in the last 168 hours No results found for this basename: AMMONIA,  in the last 168 hours CBC:  Recent Labs Lab 05/12/13 2249 05/12/13 2255 05/13/13 0330 05/14/13 0550  WBC 18.6*  --  18.2* 22.4*  NEUTROABS 15.5*  --   --   --   HGB 14.4 15.6 13.5 13.1  HCT 40.8 46.0 39.7 38.9*  MCV 91.5  --  93.2 92.8  PLT 209  --  187 214   Cardiac Enzymes:  Recent Labs Lab 05/13/13 0052  TROPONINI <0.30   BNP (last 3 results)  Recent Labs  05/13/13 0052  PROBNP 166.2*   CBG:  Recent Labs Lab 05/13/13 1337 05/13/13 1753 05/13/13 1756 05/13/13 2045 05/14/13 0611  GLUCAP 358* >600* 361* 336* 236*    No results found for this or any previous visit (from the past 240 hour(s)).   Studies: Dg Chest 2 View  05/12/2013   CLINICAL DATA:  Shortness of breath  EXAM: CHEST  2 VIEW  COMPARISON:  Prior radiograph from 05/29/2012  FINDINGS: Median sternotomy wires of underlying CABG markers again noted.  Cardiac and mediastinal silhouettes are stable.  Lungs are mildly hypoinflated. Patchy and streaky bibasilar densities are stable as compared to the prior exam, and most likely reflect chronic scarring. No definite focal infiltrate identified. There is no overt pulmonary edema. Blunting of the right costophrenic angle is stable relative to prior. No definite pleural effusion. Irregular biapical pleural thickening. No pneumothorax.  IMPRESSION: Stable appearance of the chest with patchy and streaky bibasilar densities, likely chronic scarring, and similar relative to prior radiograph from 05/29/2012. No acute cardiopulmonary abnormality identified.   Electronically Signed   By: Jeannine Boga M.D.   On: 05/12/2013 23:37    Scheduled Meds: . antiseptic oral rinse  15 mL Mouth Rinse BID  . aspirin  325 mg Oral Daily  . atorvastatin  10 mg Oral q1800  . enoxaparin (LOVENOX) injection  40 mg Subcutaneous Q24H  . insulin aspart  0-20 Units Subcutaneous TID WC  . insulin aspart  0-5 Units Subcutaneous QHS  . ipratropium-albuterol  3 mL Nebulization Q6H  . [START ON 05/15/2013] levofloxacin (LEVAQUIN) IV  750 mg Intravenous Q48H  . levothyroxine  125 mcg Oral QAC breakfast  . mometasone-formoterol  2 puff Inhalation BID  . [START ON 05/15/2013] predniSONE  40 mg Oral Q breakfast  . sodium chloride  3 mL Intravenous Q12H   Continuous Infusions: . sodium chloride 100 mL/hr at 05/14/13 0816     Time spent: 25 minutes    Azavion Bouillon, [place]  [facility] Pager [phone]. If 7PM-7AM, please contact night-coverage at [email or web address], password [facility] 05/14/2013, 8:34 AM  LOS: 1 day

## 2013-05-15 DIAGNOSIS — F17200 Nicotine dependence, unspecified, uncomplicated: Secondary | ICD-10-CM

## 2013-05-15 DIAGNOSIS — E119 Type 2 diabetes mellitus without complications: Secondary | ICD-10-CM

## 2013-05-15 LAB — BASIC METABOLIC PANEL
BUN: 25 mg/dL — AB (ref 6–23)
CHLORIDE: 97 meq/L (ref 96–112)
CO2: 26 mEq/L (ref 19–32)
CREATININE: 0.84 mg/dL (ref 0.50–1.35)
Calcium: 8.6 mg/dL (ref 8.4–10.5)
GFR, EST NON AFRICAN AMERICAN: 88 mL/min — AB (ref >90)
Glucose, Bld: 181 mg/dL — ABNORMAL HIGH (ref 70–99)
POTASSIUM: 4.7 meq/L (ref 3.7–5.3)
Sodium: 134 mEq/L — ABNORMAL LOW (ref 137–147)

## 2013-05-15 LAB — GLUCOSE, CAPILLARY
Glucose-Capillary: 179 mg/dL — ABNORMAL HIGH (ref 70–99)
Glucose-Capillary: 219 mg/dL — ABNORMAL HIGH (ref 70–99)

## 2013-05-15 LAB — CBC
HCT: 35.6 % — ABNORMAL LOW (ref 39.0–52.0)
Hemoglobin: 12.1 g/dL — ABNORMAL LOW (ref 13.0–17.0)
MCH: 31.5 pg (ref 26.0–34.0)
MCHC: 34 g/dL (ref 30.0–36.0)
MCV: 92.7 fL (ref 78.0–100.0)
PLATELETS: 223 10*3/uL (ref 150–400)
RBC: 3.84 MIL/uL — ABNORMAL LOW (ref 4.22–5.81)
RDW: 14.4 % (ref 11.5–15.5)
WBC: 16.2 10*3/uL — AB (ref 4.0–10.5)

## 2013-05-15 MED ORDER — LEVOFLOXACIN 750 MG PO TABS: 750.0000 mg | ORAL_TABLET | Freq: Every day | ORAL | Status: DC

## 2013-05-15 MED ORDER — SENNOSIDES-DOCUSATE SODIUM 8.6-50 MG PO TABS: 2.0000 | ORAL_TABLET | Freq: Two times a day (BID) | ORAL | Status: DC | PRN

## 2013-05-15 MED ORDER — IPRATROPIUM-ALBUTEROL 0.5-2.5 (3) MG/3ML IN SOLN: 3.0000 mL | RESPIRATORY_TRACT | Status: DC | PRN

## 2013-05-15 MED ORDER — POLYETHYLENE GLYCOL 3350 17 G PO PACK: 17.0000 g | PACK | Freq: Every day | ORAL | Status: DC | PRN

## 2013-05-15 MED ORDER — PREDNISONE 20 MG PO TABS
ORAL_TABLET | ORAL | Status: DC
Start: 2013-05-15 — End: 2013-08-12

## 2013-05-15 NOTE — Progress Notes (Signed)
Sats 90 % at rest, Pt ambulated in hallway 700 feet  on 2liters nasal cannula sats 83%, when patient back to room and sitting in chair sats up to 93 % , denies sob at rest, complaint of feeling winded when walking William Dyer

## 2013-05-15 NOTE — Discharge Summary (Signed)
Physician Discharge Summary  William Dyer VZD:[id number] DOB: 1945-12-28 DOA: 05/13/2013  PCP: Leonard Downing, MD  Admit date: 05/13/2013 Discharge date: 05/15/2013  Time spent: 35 minutes  Recommendations for Outpatient Follow-up:  1. Please follow up on Blood Pressures, patient's blood pressures in the low/normal range during this hospitalization, I held his lisinopril on discharge.   2. Follow up on  Blood Sugars as he is receiving steroids.   Discharge Diagnoses:  Principal Problem:   COPD with acute exacerbation Active Problems:   HYPERLIPIDEMIA   Lung cancer   Hypothyroidism   GERD (gastroesophageal reflux disease)   Obesity   Tobacco abuse   COPD with exacerbation   DM2 (diabetes mellitus, type 2)   Hypotension, unspecified   Hyponatremia   Acute kidney injury   Discharge Condition: Stable  Diet recommendation: Heart Healthy  [facility] Weights   05/12/13 2219 05/13/13 0204  Weight: 93.895 kg (207 lb) 94.076 kg (207 lb 6.4 oz)    History of present illness:  William Dyer is an 68 y.o. male with significant hx of tobacco use (50 plus pack year), hx of CAD, s/p CABGx3 about 2 years ago, active smoker since his wife died, obesity, DM2, hypothyroidism, hx of lung CA, s/p partial right lobectomy, presents to the ER with shortness of breath with wheezing and coughing for the past three days. He was given Neb tx along with IV steroid bolus in the ER. He had a leukocytosis with a WBC of 18K, but his CXR showed no definite infiltrate, just scarring. His serum Na was 130, but his renal fx tests were normal. He denied leg swelling, orthopnea, or PND. Hospitalist was asked to admit him for COPD exacerbation with possible PNA.  Hospital Course:  68 y.o. male with significant hx of tobacco use (50 plus pack year), hx of CAD, s/p CABGx3 about 2 years ago, active smoker since his wife died, obesity, DM2, hypothyroidism, hx of lung CA, s/p partial right lobectomy, presented to the ER  with shortness of breath with wheezing and coughing for the past three days with acute hypoxic respiratory failure secondary to COPD exacerbation. Patient was treated with IV steroids, antibiotic therapy and scheduled duo nebs. He showed gradual improvement over the course of his hospitalization. He did have low/normal blood pressures during this hospitalization off of antihypertensive agents. It appeared that he had been on lisinopril/hydrochlorothiazide at home which was held. Blood pressures will need to be checked on his follow up appointment. He was discharged in condition on 05/15/2013. Prior to discharge case management was consulted to set patient up with home oxygen.  Consultations:  Case management  Discharge Exam: Filed Vitals:   05/15/13 0900  BP:   Pulse: 76  Temp:   Resp: 18    General: Patient is in no acute distress, states feeling well asking to go home today Cardiovascular: Regular rate rhythm Respiratory: Diminished breath sounds bilaterally, normal respiratory effort, on supplemental oxygen. Abdomen: Soft nontender nondistended  Discharge Instructions You were cared for by a hospitalist during your hospital stay. If you have any questions about your discharge medications or the care you received while you were in the hospital after you are discharged, you can call the unit and asked to speak with the hospitalist on call if the hospitalist that took care of you is not available. Once you are discharged, your primary care physician will handle any further medical issues. Please note that NO REFILLS for any discharge medications will be authorized once  you are discharged, as it is imperative that you return to your primary care physician (or establish a relationship with a primary care physician if you do not have one) for your aftercare needs so that they can reassess your need for medications and monitor your lab values.  Discharge Orders   Future Appointments Provider  Department Dept Phone   05/28/2013 8:40 AM [facility] [place] Office [phone]   05/30/2013 9:30 AM Aris [place], [place] Office [phone]   08/12/2013 9:30 AM Kathee Delton, MD [facility] [phone]   Future Orders Complete By Expires   Call MD for:  difficulty breathing, headache or visual disturbances  As directed    Call MD for:  extreme fatigue  As directed    Call MD for:  persistant dizziness or light-headedness  As directed    Call MD for:  persistant nausea and vomiting  As directed    Call MD for:  temperature >100.4  As directed    Diet - low sodium heart healthy  As directed    Increase activity slowly  As directed        Medication List    STOP taking these medications       ibuprofen 600 MG tablet  Commonly known as:  ADVIL,MOTRIN     lisinopril-hydrochlorothiazide 10-12.5 MG per tablet  Commonly known as:  PRINZIDE,ZESTORETIC     nitroGLYCERIN 0.4 MG SL tablet  Commonly known as:  NITROSTAT      TAKE these medications       ADVAIR DISKUS 250-50 MCG/DOSE Aepb  Generic drug:  Fluticasone-Salmeterol  INHALE 1 PUFF BY MOUTH TWICE DAILY     albuterol 108 (90 BASE) MCG/ACT inhaler  Commonly known as:  PROVENTIL HFA;VENTOLIN HFA  Inhale 2 puffs into the lungs every 6 (six) hours as needed for wheezing or shortness of breath.     aspirin 325 MG tablet  Take 325 mg by mouth daily.     ipratropium-albuterol 0.5-2.5 (3) MG/3ML Soln  Commonly known as:  DUONEB  Take 3 mLs by nebulization every 4 (four) hours as needed.     levofloxacin 750 MG tablet  Commonly known as:  LEVAQUIN  Take 1 tablet (750 mg total) by mouth daily.     levothyroxine 125 MCG tablet  Commonly known as:  SYNTHROID, LEVOTHROID  Take 125 mcg by mouth daily.     loratadine 10 MG tablet  Commonly known as:  CLARITIN  Take 10 mg by mouth daily as needed.     metFORMIN 1000 MG tablet  Commonly known as:  GLUCOPHAGE  Take 1,000 mg  by mouth daily with breakfast.     MUCINEX 600 MG 12 hr tablet  Generic drug:  guaiFENesin  Take 600 mg by mouth 2 (two) times daily as needed for to loosen phlegm. For congestion     polyethylene glycol packet  Commonly known as:  MIRALAX / GLYCOLAX  Take 17 g by mouth daily as needed for moderate constipation.     predniSONE 20 MG tablet  Commonly known as:  DELTASONE  Take 40 mg PO daily for 3 days, then 30mg  PO daily for 3 days, then 20 mg PO daily for 3 days, then 10 mg PO daily for 3 days then stop.     rosuvastatin 5 MG tablet  Commonly known as:  CRESTOR  Take 5 mg by mouth at bedtime.     senna-docusate 8.6-50 MG per  tablet  Commonly known as:  Senokot-S  Take 2 tablets by mouth 2 (two) times daily as needed for moderate constipation.       Allergies  Allergen Reactions  . Statins   . Sulfonamide Derivatives Other (See Comments)    Patient "drasws up" muscularly       Follow-up Information   Follow up with Leonard Downing, MD In 1 week.   Specialty:  Family Medicine   Contact information:   [address] [id number] [phone]        The results of significant diagnostics from this hospitalization (including imaging, microbiology, ancillary and laboratory) are listed below for reference.    Significant Diagnostic Studies: Dg Chest 2 View  05/12/2013   CLINICAL DATA:  Shortness of breath  EXAM: CHEST  2 VIEW  COMPARISON:  Prior radiograph from 05/29/2012  FINDINGS: Median sternotomy wires of underlying CABG markers again noted. Cardiac and mediastinal silhouettes are stable.  Lungs are mildly hypoinflated. Patchy and streaky bibasilar densities are stable as compared to the prior exam, and most likely reflect chronic scarring. No definite focal infiltrate identified. There is no overt pulmonary edema. Blunting of the right costophrenic angle is stable relative to prior. No definite pleural effusion. Irregular biapical pleural thickening.  No pneumothorax.  IMPRESSION: Stable appearance of the chest with patchy and streaky bibasilar densities, likely chronic scarring, and similar relative to prior radiograph from 05/29/2012. No acute cardiopulmonary abnormality identified.   Electronically Signed   By: Jeannine Boga M.D.   On: 05/12/2013 23:37    Microbiology: No results found for this or any previous visit (from the past 240 hour(s)).   Labs: Basic Metabolic Panel:  Recent Labs Lab 05/12/13 2255 05/13/13 0330 05/14/13 0550 05/15/13 0404  NA 130*  --  129* 134*  K 4.0  --  4.7 4.7  CL 91*  --  87* 97  CO2  --   --  24 26  GLUCOSE 144*  --  232* 181*  BUN 10  --  37* 25*  CREATININE 0.90 0.76 1.42* 0.84  CALCIUM  --   --  8.8 8.6   Liver Function Tests: No results found for this basename: AST, ALT, ALKPHOS, BILITOT, PROT, ALBUMIN,  in the last 168 hours No results found for this basename: LIPASE, AMYLASE,  in the last 168 hours No results found for this basename: AMMONIA,  in the last 168 hours CBC:  Recent Labs Lab 05/12/13 2249 05/12/13 2255 05/13/13 0330 05/14/13 0550 05/15/13 0404  WBC 18.6*  --  18.2* 22.4* 16.2*  NEUTROABS 15.5*  --   --   --   --   HGB 14.4 15.6 13.5 13.1 12.1*  HCT 40.8 46.0 39.7 38.9* 35.6*  MCV 91.5  --  93.2 92.8 92.7  PLT 209  --  187 214 223   Cardiac Enzymes:  Recent Labs Lab 05/13/13 0052  TROPONINI <0.30   BNP: BNP (last 3 results)  Recent Labs  05/13/13 0052  PROBNP 166.2*   CBG:  Recent Labs Lab 05/14/13 0611 05/14/13 1119 05/14/13 1626 05/14/13 2056 05/15/13 0601  GLUCAP 236* 370* 308* 325* 179*       Signed:  Kelvin Cellar  [facility] 05/15/2013, 9:49 AM

## 2013-05-21 ENCOUNTER — Telehealth: Payer: Self-pay | Admitting: Pulmonary Disease

## 2013-05-22 NOTE — Telephone Encounter (Signed)
I called spoke with Angie. She needed pt last O2 sats faxed to her from 2013. I have done so. Nothing further  needed

## 2013-05-28 ENCOUNTER — Other Ambulatory Visit (INDEPENDENT_AMBULATORY_CARE_PROVIDER_SITE_OTHER): Payer: Medicare Other

## 2013-05-28 DIAGNOSIS — E785 Hyperlipidemia, unspecified: Secondary | ICD-10-CM

## 2013-05-28 LAB — LIPID PANEL
CHOL/HDL RATIO: 2
CHOLESTEROL: 202 mg/dL — AB (ref 0–200)
HDL: 83.8 mg/dL (ref >39.00)
LDL CALC: 86 mg/dL (ref 0–99)
TRIGLYCERIDES: 159 mg/dL — AB (ref 0.0–149.0)
VLDL: 31.8 mg/dL (ref 0.0–40.0)

## 2013-05-28 LAB — HEPATIC FUNCTION PANEL
ALBUMIN: 3.7 g/dL (ref 3.5–5.2)
ALT: 26 U/L (ref 0–53)
AST: 16 U/L (ref 0–37)
Alkaline Phosphatase: 35 U/L — ABNORMAL LOW (ref 39–117)
Bilirubin, Direct: 0.1 mg/dL (ref 0.0–0.3)
TOTAL PROTEIN: 6.7 g/dL (ref 6.0–8.3)
Total Bilirubin: 1.2 mg/dL (ref 0.3–1.2)

## 2013-05-30 ENCOUNTER — Ambulatory Visit (INDEPENDENT_AMBULATORY_CARE_PROVIDER_SITE_OTHER): Payer: Medicare Other | Admitting: Pharmacist

## 2013-05-30 VITALS — Wt 210.0 lb

## 2013-05-30 DIAGNOSIS — Z79899 Other long term (current) drug therapy: Secondary | ICD-10-CM

## 2013-05-30 DIAGNOSIS — E785 Hyperlipidemia, unspecified: Secondary | ICD-10-CM

## 2013-05-30 NOTE — Progress Notes (Signed)
HPI  [name] is a 68 yo M pt of Dr. [name] here for follow-up for his hyperlipidemia.  He has a history of CAD s/p CABG x3 in February 2013.  He has been taking Crestor 5 mg daily for the past few months.  He was previously taking this just three times weekly, but has been successful in increasing to 5 mg daily.   He reports that he has no muscle pain since starting this regimen.  He failed Crestor 10 mg qd in the past (myalgias). He also has history of DM.  His fasting blood sugars are around 130 and pre-prandial at dinner are 90-95.  His wife recently passed away earlier this year.  His cholesterol panel from 3 months showed elevated cholesterol, mostly b/c he was out of his Crestor tiw for 2 weeks prior to labs.  Today his cholesterol is much improved on Crestor 5 mg qd.  LDL down from 170 to 86 mg/dL, and TG down from 500 to 159 mg/dL.  Diet He is only cooking for himself now.  He has been eating lots of fish and chicken with vegetables.  He rarely eats fried foods.  Exercise Pt does not have a regular exercise routine, but he is active throughout the day.  RF:  CABG x 3 (03/2011), Diabetes, HTN, age - LDL goal < 70, non-HDL goal < 100 Meds:  Crestor 5 mg qd. Intolerant:  Simvastatin and lipitor have both caused muscle aches in the past.  Also failed Crestor 10 mg qd in the past (muscle aches)    Labs:   05/213:  TC 202, LDL 86, TG 159, HDL 84, LFTs normal (Crestor 5 mg qd)  Current Outpatient Prescriptions on File Prior to Visit  Medication Sig Dispense Refill  . ADVAIR DISKUS 250-50 MCG/DOSE AEPB INHALE 1 PUFF BY MOUTH TWICE DAILY  60 each  5  . albuterol (PROVENTIL HFA;VENTOLIN HFA) 108 (90 BASE) MCG/ACT inhaler Inhale 2 puffs into the lungs every 6 (six) hours as needed for wheezing or shortness of breath.  1 Inhaler  5  . aspirin 325 MG tablet Take 325 mg by mouth daily.      Marland Kitchen guaiFENesin (MUCINEX) 600 MG 12 hr tablet Take 600 mg by mouth 2 (two) times daily as needed for to loosen  phlegm. For congestion      . ipratropium-albuterol (DUONEB) 0.5-2.5 (3) MG/3ML SOLN Take 3 mLs by nebulization every 4 (four) hours as needed.  360 mL  1  . levothyroxine (SYNTHROID, LEVOTHROID) 125 MCG tablet Take 125 mcg by mouth daily.        Marland Kitchen loratadine (CLARITIN) 10 MG tablet Take 10 mg by mouth daily as needed.       . metFORMIN (GLUCOPHAGE) 1000 MG tablet Take 1,000 mg by mouth daily with breakfast.       . polyethylene glycol (MIRALAX / GLYCOLAX) packet Take 17 g by mouth daily as needed for moderate constipation.  14 each  0  . predniSONE (DELTASONE) 20 MG tablet Take 40 mg PO daily for 3 days, then 30mg  PO daily for 3 days, then 20 mg PO daily for 3 days, then 10 mg PO daily for 3 days then stop.  30 tablet  0  . senna-docusate (SENOKOT-S) 8.6-50 MG per tablet Take 2 tablets by mouth 2 (two) times daily as needed for moderate constipation.  30 tablet  0   No current facility-administered medications on file prior to visit.    Allergies  Allergen  Reactions  . Statins     Failed lipitor and Zocor due to muscle aches  . Sulfonamide Derivatives Other (See Comments)    Patient "drasws up" muscularly

## 2013-05-30 NOTE — Assessment & Plan Note (Signed)
I mentioned the SPIRE-1 clinical trial with him given he is on his max tolerated dose of Crestor (5 mg qd), and he has CABG two years ago.  His LDL is now down to 86 mg/dL while is approximately a 50% reduction for him.  Patient is not interested in clinical trial at this time, but wants to wait and see what his cholesterol looks like later this year, and if it has gone back up, may consider SPIRE-1 trial at that time.  Plan: 1.  Continue Crestor 5 mg once daily. 2.  Call if you decide to try and enroll into clinical trial 3. Recheck cholesterol in 6 months (11/25/13  - fasting labs this morning), then see Donne Anon on 11/26/13 at 9:30 am

## 2013-05-30 NOTE — Patient Instructions (Signed)
1.  Continue Crestor 5 mg once daily. 2.  Call if you decide to try and enroll into clinical trial 3. Recheck cholesterol in 6 months (11/25/13  - fasting labs this morning), then see William Dyer on 11/26/13 at 9:30 am

## 2013-08-12 ENCOUNTER — Ambulatory Visit (INDEPENDENT_AMBULATORY_CARE_PROVIDER_SITE_OTHER)
Admission: RE | Admit: 2013-08-12 | Discharge: 2013-08-12 | Disposition: A | Discharge status: Home / Self Care | Payer: Medicare Other | Source: Ambulatory Visit | Attending: Pulmonary Disease | Admitting: Pulmonary Disease

## 2013-08-12 ENCOUNTER — Encounter: Payer: Self-pay | Admitting: Pulmonary Disease

## 2013-08-12 ENCOUNTER — Ambulatory Visit (INDEPENDENT_AMBULATORY_CARE_PROVIDER_SITE_OTHER): Payer: Medicare Other | Admitting: Pulmonary Disease

## 2013-08-12 VITALS — BP 144/92 | HR 86 | Temp 96.8°F | Ht 64.0 in | Wt 207.6 lb

## 2013-08-12 DIAGNOSIS — J438 Other emphysema: Secondary | ICD-10-CM

## 2013-08-12 NOTE — Progress Notes (Signed)
   Subjective:    Patient ID: William Dyer, male    DOB: 1945/09/19, 68 y.o.   MRN: [id number]  HPI Patient comes in today for followup of his known COPD with chronic respiratory failure. He was in the hospital in April with a COPD exacerbation and blood pressure issues, but did not have pneumonia radiographically. He did have significant improvement, but now feels that his breathing has not gotten back to baseline. He is having dyspnea with any type of exertion, but denies cough, congestion, or mucus production. Unfortunately, he continues to smoke on occasion despite my encouragement to stop. He feels that his breathing was better on Spiriva and Advair, but had urinary retention issues on the Spiriva. He is currently on Advair alone. He is having some right-sided chest discomfort that is below his scapula posteriorly. He has had some chest wall discomfort ever since his chest surgery. His discomfort is sharp, but it is not just related to inspiration.   Review of Systems  Constitutional: Negative for fever and unexpected weight change.  HENT: Negative for congestion, dental problem, ear pain, nosebleeds, postnasal drip, rhinorrhea, sinus pressure, sneezing, sore throat and trouble swallowing.   Eyes: Negative for redness and itching.  Respiratory: Positive for shortness of breath. Negative for cough, chest tightness and wheezing.   Cardiovascular: Positive for chest pain and leg swelling. Negative for palpitations.  Gastrointestinal: Negative for nausea and vomiting.  Genitourinary: Negative for dysuria.  Musculoskeletal: Negative for joint swelling.  Skin: Negative for rash.  Neurological: Negative for headaches.  Hematological: Does not bruise/bleed easily.  Psychiatric/Behavioral: Negative for dysphoric mood. The patient is not nervous/anxious.        Objective:   Physical Exam Obese male in no acute distress Nose without purulence or discharge noted Neck without lymphadenopathy or  thyromegaly Chest with faint basilar crackles, decreased breath sounds, no active wheezing Cardiac exam with regular rate and rhythm Lower extremities with mild edema, no cyanosis Alert and oriented, moves all 4 extremities.       Assessment & Plan:

## 2013-08-12 NOTE — Assessment & Plan Note (Signed)
The patient was recently hospitalized for an acute exacerbation, and has not returned to his usual baseline. He felt his breathing was much better with Spiriva and Advair, but had urinary retention issues with the Spiriva. I would like to try him on the Respimat formulation of Spiriva, since it is half the dose and therefore has much less side effects. He has no acute bronchospasm on exam today, and is moving air adequately. He has severe centripetal obesity which I think contributes to his symptoms, and is also continuing to smoke on occasion. He is having some atypical chest discomfort that may just be chest wall in origin, but we'll check a chest x-ray today for completeness. If this continues, he will need a CT angio given his increased shortness of breath, chest discomfort, and recent hospitalization.

## 2013-08-12 NOTE — Patient Instructions (Signed)
Stop smoking cigarettes! Stay on advair, and will try you on a different form of spiriva for the next 2 weeks to see if you tolerate better and if it helps your breathing.  Take 2 inhalations each am. If you have bladder issues again on the spiriva, let us know and stop the medication.  If you are doing well, let us know and we can send in a prescription. Will check chest xray today, and call you with results.  If your chest discomfort does not improve by next week, let us know.  Follow up with me again in 52mos.

## 2013-08-15 ENCOUNTER — Telehealth: Payer: Self-pay | Admitting: *Deleted

## 2013-08-15 ENCOUNTER — Ambulatory Visit (INDEPENDENT_AMBULATORY_CARE_PROVIDER_SITE_OTHER)
Admission: RE | Admit: 2013-08-15 | Discharge: 2013-08-15 | Disposition: A | Discharge status: Home / Self Care | Payer: Medicare Other | Source: Ambulatory Visit | Attending: Pulmonary Disease | Admitting: Pulmonary Disease

## 2013-08-15 ENCOUNTER — Telehealth: Payer: Self-pay | Admitting: Pulmonary Disease

## 2013-08-15 DIAGNOSIS — R079 Chest pain, unspecified: Secondary | ICD-10-CM

## 2013-08-15 DIAGNOSIS — J438 Other emphysema: Secondary | ICD-10-CM

## 2013-08-15 MED ORDER — IOHEXOL 350 MG/ML SOLN
80.0000 mL | Freq: Once | INTRAVENOUS | Status: AC | PRN
  Administered 2013-08-15: 80 mL via INTRAVENOUS

## 2013-08-15 NOTE — Telephone Encounter (Signed)
Called spoke with pt. Order placed and aware our PCC's will call him to set this up. Nothing further needed

## 2013-08-15 NOTE — Telephone Encounter (Signed)
Rose called about the pt that they did the ct angio on and wanted to have someone look at this before she sent the pt home.  [facility] is off this afternoon so i printed the report and took this to CY to review.  CY ok for the pt to go home.  Nothing acute on the ct angio.  Called and spoke with [facility] and she is aware.  Will forward to [facility] to review the ct angio tomorrow.

## 2013-08-15 NOTE — Telephone Encounter (Signed)
He needs CT ANGIO to r/o PE and evaluate for other causes of right sided chest pain Had bmet in April of this year.

## 2013-08-15 NOTE — Telephone Encounter (Signed)
Per OV 08/12/13; Stop smoking cigarettes!  Stay on advair, and will try you on a different form of spiriva for the next 2 weeks to see if you tolerate better and if it helps your breathing. Take 2 inhalations each am.  If you have bladder issues again on the spiriva, let us know and stop the medication. If you are doing well, let us know and we can send in a prescription.  Will check chest xray today, and call you with results. If your chest discomfort does not improve by next week, let us know.  Follow up with me again in 27mos --  I called spoke w/ pt. He c/o lung pain and it radiates into his back. His chest feels very sore and hurts to take a deep breathe. He takes ibuprofen 600 mg and it helps for about 1 hr. He reports [place] told him if the pain did not get better then he may want to do a CT scan. Please advise KC thanks   Allergies  Allergen Reactions  . Statins     Failed lipitor and Zocor due to muscle aches  . Sulfonamide Derivatives Other (See Comments)    Patient "drasws up" muscularly

## 2013-08-16 ENCOUNTER — Telehealth: Payer: Self-pay | Admitting: Pulmonary Disease

## 2013-08-16 NOTE — Telephone Encounter (Signed)
I want him to try advil 200mg , take 3 every 8hrs if needed for pain. This problem is in the chest wall, not the lungs.   I would love to help him, but I do not take care of chronic pain.  His primary can refer him to a pain specialist for this

## 2013-08-16 NOTE — Telephone Encounter (Signed)
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 I suspect his pain is related to postop scarring/rib abnormality. If continues, he may need to see a pain specialist --  I called made pt aware of results. He reports he needs something to be done. He reports he is in a lot pain. He takes ibuprofen 600 mg and only helps for 1 hr. He wants something called in for his pain. Also wants to know what he needs to do regarding his rib abnormality. Please advise KC thanks  Allergies  Allergen Reactions  . Statins     Failed lipitor and Zocor due to muscle aches  . Sulfonamide Derivatives Other (See Comments)    Patient "drasws up" muscularly

## 2013-08-16 NOTE — Telephone Encounter (Signed)
Pt calling again with questions regarding his CT results >> "rib deformity".  Pt stated his surgery was 16 yrs ago.  The pain mentioned by Mindy below is sharp at times and "takes his breath."  Pt is requesting assistance w/ this ASAP.  Pt's CT has been faxed to his PCP per the other 7.10.15 phone note   Dr Gwenette Greet please advise, thank you!

## 2013-08-16 NOTE — Telephone Encounter (Signed)
Called spoke with pt. He reports his PCP just called him. He needed nothing further

## 2013-08-16 NOTE — Telephone Encounter (Signed)
Per Amy Dr Arelia Sneddon would like copy of pt's recent CT Angio done 7.9.15 and would like this faxed to [phone] CT has been printed, and faxed to the number above to Amy's attn Nothing further needed; will sign off.

## 2013-08-27 ENCOUNTER — Telehealth: Payer: Self-pay | Admitting: Pulmonary Disease

## 2013-08-27 NOTE — Telephone Encounter (Signed)
If he feels the spiriva is still affecting his bladder, then cannot stay on this medication.  If he feels his bladder issues are the same whether he is on spiriva or not, then would be willing to try the new spiriva for another month or so.

## 2013-08-27 NOTE — Telephone Encounter (Signed)
I called spoke with pt. He did not have issues with bladder prior to spiriva. He will stop this medication. He wants to know what can replaced this as he does not feel advair alone helps his breathing. He is fine with a call back tomorrow. Please advise KC thanks  Allergies  Allergen Reactions  . Statins     Failed lipitor and Zocor due to muscle aches  . Sulfonamide Derivatives Other (See Comments)    Patient "drasws up" muscularly

## 2013-08-27 NOTE — Telephone Encounter (Signed)
Per OV 08/12/13; Stop smoking cigarettes! Stay on advair, and will try you on a different form of spiriva for the next 2 weeks to see if you tolerate better and if it helps your breathing.  Take 2 inhalations each am. If you have bladder issues again on the spiriva, let us know and stop the medication.  If you are doing well, let us know and we can send in a prescription. Will check chest xray today, and call you with results.  If your chest discomfort does not improve by next week, let us know.   Follow up with me again in 39mos --  Spoke with pt. He reports he tried the spiriva resp. He reports it does help his breathing but still having problems at bedtime to use the restroom. He reports he does not have this problem during the day. He really like this medication. Please advise [place] thanks

## 2013-08-27 NOTE — Telephone Encounter (Signed)
Given his bladder issues, he needs to avoid this class of medications like spiriva all together.  There is really nothing else to add that is new.  He could try breo one qd, symbicort 160 2 bid, or dulera 100  2 bid.  Let him check with his insurance to see what is covered.

## 2013-08-28 NOTE — Telephone Encounter (Signed)
Called made pt aware of recs. He will check with his insurance and call back once he figures which he prefers. Nothing further needed and will await call back

## 2013-08-30 ENCOUNTER — Encounter: Payer: Self-pay | Admitting: Cardiovascular Disease

## 2013-08-30 ENCOUNTER — Ambulatory Visit (INDEPENDENT_AMBULATORY_CARE_PROVIDER_SITE_OTHER): Payer: Medicare Other | Admitting: Cardiovascular Disease

## 2013-08-30 VITALS — BP 132/64 | HR 92 | Ht 64.0 in | Wt 210.0 lb

## 2013-08-30 DIAGNOSIS — E785 Hyperlipidemia, unspecified: Secondary | ICD-10-CM

## 2013-08-30 DIAGNOSIS — I2584 Coronary atherosclerosis due to calcified coronary lesion: Secondary | ICD-10-CM

## 2013-08-30 DIAGNOSIS — I2510 Atherosclerotic heart disease of native coronary artery without angina pectoris: Secondary | ICD-10-CM

## 2013-08-30 DIAGNOSIS — J449 Chronic obstructive pulmonary disease, unspecified: Secondary | ICD-10-CM

## 2013-08-30 NOTE — Progress Notes (Signed)
Patient ID: William Dyer, male   DOB: 03-17-1945, 68 y.o.   MRN: [id number] This is a 68 year old male patient who has a history of coronary artery disease status post CABG x3 in February 2013. Saw PA 2/14 after spending 6 hours in the emergency room with left arm and shoulder pain. His troponins were negative x2 and EKG unchanged. The patient says his left shoulder and arm have been hurting off and on for a week . He says it comes and goes and is is with ibuprofen. He feels like he may have a shoulder problem but these are this is similar symptoms to when he had his bypass. He denies any chest tightness, pressure, palpitations, dizziness or presyncope. He has had some sweating spells but none associated with the arm pain. He denies any exertional symptoms. His arm hurts to move in certain ways and is biceps is tender to touch. He is on oxygen at night but has not been using it as often as he should. He smokes electronic cigs Sees [facility] chest Xray 4/14 with basilar scarring no CA.   F/U myovue 03/23/12 Normal with EF 64%  LDL down to 86 with 5 mg Crestor has seen lipid clinic but stopped the crestor due to leg pains  Initially not interested in SPIRE trial but willing to discuss further    ROS: Denies fever, malais, weight loss, blurry vision, decreased visual acuity, cough, sputum, SOB, hemoptysis, pleuritic pain, palpitaitons, heartburn, abdominal pain, melena, lower extremity edema, claudication, or rash.  All other systems reviewed and negative  General: Affect appropriate Chronically ill obese white male  HEENT: normal Neck supple with no adenopathy JVP normal no bruits no thyromegaly Lungs Rhonchi  no wheezing and good diaphragmatic motion Heart:  S1/S2 no murmur, no rub, gallop or click PMI normal Abdomen: benighn, BS positve, no tenderness, no AAA no bruit.  No HSM or HJR Distal pulses intact with no bruits No edema Neuro non-focal Skin warm and dry No muscular weakness   Current  Outpatient Prescriptions  Medication Sig Dispense Refill  . ADVAIR DISKUS 250-50 MCG/DOSE AEPB INHALE 1 PUFF BY MOUTH TWICE DAILY  60 each  5  . albuterol (PROVENTIL HFA;VENTOLIN HFA) 108 (90 BASE) MCG/ACT inhaler Inhale 2 puffs into the lungs every 6 (six) hours as needed for wheezing or shortness of breath.  1 Inhaler  5  . aspirin 325 MG tablet Take 325 mg by mouth daily.       Marland Kitchen guaiFENesin (MUCINEX) 600 MG 12 hr tablet Take 600 mg by mouth 2 (two) times daily as needed for to loosen phlegm. For congestion      . ipratropium-albuterol (DUONEB) 0.5-2.5 (3) MG/3ML SOLN Take 3 mLs by nebulization every 4 (four) hours as needed.  360 mL  1  . levothyroxine (SYNTHROID, LEVOTHROID) 125 MCG tablet Take 125 mcg by mouth daily.        Marland Kitchen lisinopril-hydrochlorothiazide (PRINZIDE,ZESTORETIC) 10-12.5 MG per tablet       . loratadine (CLARITIN) 10 MG tablet Take 10 mg by mouth daily as needed.       . metFORMIN (GLUCOPHAGE) 1000 MG tablet Take 1,000 mg by mouth 2 (two) times daily with a meal.       . polyethylene glycol (MIRALAX / GLYCOLAX) packet Take 17 g by mouth daily as needed for moderate constipation.  14 each  0  . senna-docusate (SENOKOT-S) 8.6-50 MG per tablet Take 2 tablets by mouth 2 (two) times daily as needed for moderate  constipation.  30 tablet  0   No current facility-administered medications for this visit.    Allergies  Statins and Sulfonamide derivatives  Electrocardiogram:  ST rate 108 otherwise normal   Assessment and Plan

## 2013-08-30 NOTE — Patient Instructions (Signed)
Your physician wants you to follow-up in:   [place] will receive a reminder letter in the mail two months in advance. If you don't receive a letter, please call our office to schedule the follow-up appointment. Your physician recommends that you continue on your current medications as directed. Please refer to the Current Medication list given to you today.  You have been referred to  [place]

## 2013-08-30 NOTE — Assessment & Plan Note (Signed)
Stable with no angina and good activity level.  Continue medical Rx  

## 2013-08-30 NOTE — Assessment & Plan Note (Signed)
F/U with lipid clinic try to enroll in [facility] 1 trial

## 2013-08-30 NOTE — Assessment & Plan Note (Signed)
Clinically his most limiting illness  Trying to Vape  Symbicort causing urinary retention  F/u pulmonary

## 2013-09-03 ENCOUNTER — Ambulatory Visit: Payer: Medicare Other | Admitting: Pharmacist

## 2013-09-04 ENCOUNTER — Telehealth: Payer: Self-pay | Admitting: Pharmacist

## 2013-09-04 NOTE — Telephone Encounter (Signed)
Patient missed his 09/03/13 lipid clinic appointment and has r/s this to 11/2013.  I called patient to discuss his options.  He had a CABG 03/2011 and has failed Crestor 5 mg qd, Lipitor, and Zocor due to muscle aches in his legs.  We discussed SPIRE clinical trial as the cost of PCSK-9 inhibitors will be too much for him with his insurance.  He wants to wait until October and think about it more as he is more concerned about potential long term adverse effects.  I explained no longer term issues have been found thus far, but would speak with him at his October f/u appointment about it.

## 2013-09-26 ENCOUNTER — Other Ambulatory Visit: Payer: Self-pay | Admitting: Pulmonary Disease

## 2013-11-25 ENCOUNTER — Other Ambulatory Visit (INDEPENDENT_AMBULATORY_CARE_PROVIDER_SITE_OTHER): Payer: Medicare Other | Admitting: *Deleted

## 2013-11-25 DIAGNOSIS — E785 Hyperlipidemia, unspecified: Secondary | ICD-10-CM

## 2013-11-25 DIAGNOSIS — Z79899 Other long term (current) drug therapy: Secondary | ICD-10-CM

## 2013-11-25 LAB — HEPATIC FUNCTION PANEL
ALK PHOS: 48 U/L (ref 39–117)
ALT: 14 U/L (ref 0–53)
AST: 16 U/L (ref 0–37)
Albumin: 3.6 g/dL (ref 3.5–5.2)
BILIRUBIN DIRECT: 0 mg/dL (ref 0.0–0.3)
Total Bilirubin: 1 mg/dL (ref 0.2–1.2)
Total Protein: 7.3 g/dL (ref 6.0–8.3)

## 2013-11-25 LAB — LIPID PANEL
CHOLESTEROL: 287 mg/dL — AB (ref 0–200)
HDL: 43.5 mg/dL (ref >39.00)
NonHDL: 243.5
Total CHOL/HDL Ratio: 7
Triglycerides: 368 mg/dL — ABNORMAL HIGH (ref 0.0–149.0)
VLDL: 73.6 mg/dL — ABNORMAL HIGH (ref 0.0–40.0)

## 2013-11-25 LAB — LDL CHOLESTEROL, DIRECT: Direct LDL: 169.6 mg/dL

## 2013-11-26 ENCOUNTER — Ambulatory Visit (INDEPENDENT_AMBULATORY_CARE_PROVIDER_SITE_OTHER): Payer: Medicare Other | Admitting: Pharmacist

## 2013-11-26 DIAGNOSIS — E782 Mixed hyperlipidemia: Secondary | ICD-10-CM

## 2013-11-26 NOTE — Patient Instructions (Signed)
Start Praluent 75mg  every 2 weeks.  (Next injections due 11/3 and 11/17).  Recheck labs in 6 weeks.

## 2013-12-02 NOTE — Progress Notes (Signed)
HPI  [name] is a 68 yo M pt of Dr. Johnsie Dyer here for follow-up for his hyperlipidemia.  He has a history of CAD s/p CABG x3 in February 2013.  He has not been taking his Crestor over the past few months due to muscle pains.  He was previously taking this just three times weekly, but has been successful in increasing to 5 mg daily.   He reports that he has no muscle pain since starting this regimen.  He failed Crestor 10 mg qd in the past (myalgias)., Lipitor, and simvastatin.    RF:  CABG x 3 (03/2011), Diabetes, HTN, age - LDL goal < 70, non-HDL goal < 100 Meds:  none Intolerant:  Simvastatin and lipitor have both caused muscle aches in the past.  Also failed Crestor 5mg  and 10 mg qd in the past (muscle aches) , fenofibrate  Labs:   11/2013: TC 287, LDL 170, TG 368, HDL 74, LFTs normal (no therapy) 05/213:  TC 202, LDL 86, TG 159, HDL 84, LFTs normal (Crestor 5 mg qd)  Current Outpatient Prescriptions on File Prior to Visit  Medication Sig Dispense Refill  . ADVAIR DISKUS 250-50 MCG/DOSE AEPB INHALE 1 PUFF BY MOUTH TWICE DAILY  60 each  6  . albuterol (PROVENTIL HFA;VENTOLIN HFA) 108 (90 BASE) MCG/ACT inhaler Inhale 2 puffs into the lungs every 6 (six) hours as needed for wheezing or shortness of breath.  1 Inhaler  5  . aspirin 325 MG tablet Take 325 mg by mouth daily.       Marland Kitchen guaiFENesin (MUCINEX) 600 MG 12 hr tablet Take 600 mg by mouth 2 (two) times daily as needed for to loosen phlegm. For congestion      . ipratropium-albuterol (DUONEB) 0.5-2.5 (3) MG/3ML SOLN Take 3 mLs by nebulization every 4 (four) hours as needed.  360 mL  1  . levothyroxine (SYNTHROID, LEVOTHROID) 125 MCG tablet Take 125 mcg by mouth daily.        Marland Kitchen lisinopril-hydrochlorothiazide (PRINZIDE,ZESTORETIC) 10-12.5 MG per tablet       . loratadine (CLARITIN) 10 MG tablet Take 10 mg by mouth daily as needed.       . metFORMIN (GLUCOPHAGE) 1000 MG tablet Take 1,000 mg by mouth 2 (two) times daily with a meal.       .  polyethylene glycol (MIRALAX / GLYCOLAX) packet Take 17 g by mouth daily as needed for moderate constipation.  14 each  0  . senna-docusate (SENOKOT-S) 8.6-50 MG per tablet Take 2 tablets by mouth 2 (two) times daily as needed for moderate constipation.  30 tablet  0   No current facility-administered medications on file prior to visit.    Allergies  Allergen Reactions  . Statins     Failed lipitor and Zocor due to muscle aches  . Sulfonamide Derivatives Other (See Comments)    Patient "drasws up" muscularly

## 2013-12-02 NOTE — Assessment & Plan Note (Signed)
Pt's LDL greatly elevated since off statin (170 goal <70).  He has tried and failed multiple statins including Crestor, Lipitor and simvastatin.  He has also tried fenofibrate in the past.  Zetia unlikely to get him to goal.  Discussed PCSK-9 inhibitor with pt.  Given he is now single income, he may qualify for patient assistance.  Pt agrees to start therapy.  He was given his first dose in the office and paperwork signed.  Will follow up in 6 weeks to determine efficacy of Praluent.

## 2013-12-13 ENCOUNTER — Telehealth: Payer: Self-pay | Admitting: *Deleted

## 2013-12-13 NOTE — Telephone Encounter (Signed)
Spoke with [facility] team.  Information corrected.

## 2013-12-13 NOTE — Telephone Encounter (Signed)
A representative from the praluent patient assistance called and stated that the form that was faxed to them did not have the quantity and number of refills marked. Please call [phone] and ask to speak with a pharmacist to give a verbal order for this. Thanks, MI

## 2013-12-17 ENCOUNTER — Telehealth: Payer: Self-pay | Admitting: Pharmacist

## 2013-12-17 NOTE — Telephone Encounter (Signed)
New message     Talk to William Dyer about a new medication you gave him for cholesterol

## 2013-12-17 NOTE — Telephone Encounter (Signed)
Spoke with pt.  He has received 2 doses of Praluent through the [employer].  We have received the denial from his insurance company so now we are working through [employer] process.

## 2013-12-20 ENCOUNTER — Other Ambulatory Visit: Payer: Self-pay | Admitting: Family Medicine

## 2013-12-20 DIAGNOSIS — Z139 Encounter for screening, unspecified: Secondary | ICD-10-CM

## 2013-12-20 DIAGNOSIS — F17200 Nicotine dependence, unspecified, uncomplicated: Secondary | ICD-10-CM

## 2013-12-24 ENCOUNTER — Other Ambulatory Visit (INDEPENDENT_AMBULATORY_CARE_PROVIDER_SITE_OTHER): Payer: Medicare Other | Admitting: *Deleted

## 2013-12-24 DIAGNOSIS — E782 Mixed hyperlipidemia: Secondary | ICD-10-CM

## 2013-12-25 LAB — LIPID PANEL
Cholesterol: 181 mg/dL (ref 0–200)
HDL: 60.3 mg/dL
LDL Cholesterol: 84 mg/dL (ref 0–99)
NonHDL: 120.7
Total CHOL/HDL Ratio: 3
Triglycerides: 185 mg/dL — ABNORMAL HIGH (ref 0.0–149.0)
VLDL: 37 mg/dL (ref 0.0–40.0)

## 2013-12-25 LAB — HEPATIC FUNCTION PANEL
ALT: 17 U/L (ref 0–53)
AST: 16 U/L (ref 0–37)
Albumin: 4.2 g/dL (ref 3.5–5.2)
Alkaline Phosphatase: 44 U/L (ref 39–117)
BILIRUBIN DIRECT: 0.1 mg/dL (ref 0.0–0.3)
BILIRUBIN TOTAL: 1 mg/dL (ref 0.2–1.2)
TOTAL PROTEIN: 7 g/dL (ref 6.0–8.3)

## 2013-12-26 ENCOUNTER — Ambulatory Visit
Admission: RE | Admit: 2013-12-26 | Discharge: 2013-12-26 | Disposition: A | Discharge status: Home / Self Care | Payer: Medicare Other | Source: Ambulatory Visit | Attending: Family Medicine | Admitting: Family Medicine

## 2013-12-26 ENCOUNTER — Encounter: Payer: Self-pay | Admitting: Pharmacist Clinician (PhC)/ Clinical Pharmacy Specialist

## 2013-12-26 ENCOUNTER — Ambulatory Visit (INDEPENDENT_AMBULATORY_CARE_PROVIDER_SITE_OTHER): Payer: Medicare Other | Admitting: Pharmacist Clinician (PhC)/ Clinical Pharmacy Specialist

## 2013-12-26 VITALS — Ht 64.0 in | Wt 210.5 lb

## 2013-12-26 DIAGNOSIS — F17200 Nicotine dependence, unspecified, uncomplicated: Secondary | ICD-10-CM

## 2013-12-26 DIAGNOSIS — Z139 Encounter for screening, unspecified: Secondary | ICD-10-CM

## 2013-12-26 DIAGNOSIS — E785 Hyperlipidemia, unspecified: Secondary | ICD-10-CM

## 2013-12-26 DIAGNOSIS — E782 Mixed hyperlipidemia: Secondary | ICD-10-CM

## 2013-12-26 NOTE — Patient Instructions (Signed)
Continue with Praluent injections every 2 weeks.  We will continue working to get the medication covered by your insurance.  Repeat blood work in 3 months, see [place] in the office a few days later

## 2013-12-26 NOTE — Progress Notes (Signed)
HPI  William Dyer is a 68 yo M pt of Dr. [name] here for follow-up for his hyperlipidemia.  He has a history of CAD s/p CABG x3 in February 2013.  He has failed atorvastatin, simvastatin and rosuvastatin (5mg  three times weekly).  He was started on Praluent 75 mg every 14 days and took his last injection 2 days ago.  His LDL was as high as 170 without statin to 84 today.  Diet He is only cooking for himself now.  He has been eating lots of fish and chicken with vegetables.  He rarely eats fried foods.  Exercise Pt does not have a regular exercise routine, but he is active throughout the day.  RF:  CABG x 3 (03/2011), Diabetes, HTN, age - LDL goal < 70, non-HDL goal < 100 Meds:  Crestor 5 mg qd. Intolerant:  Simvastatin and lipitor have both caused muscle aches in the past.  Also failed Crestor 5mg  three times weekly.    Labs:   12/2013: TC 181, LDL 84, TG 185, HDL 60.3,  LFTs normal (Praluent 75mg ) 11/2013: TC 287, LDL 169, TG 368, HDL 43.5 LFTs normal (no meds) 05/213:  TC 202, LDL 86, TG 159, HDL 84, LFTs normal (Crestor 5 mg qd)  Current Outpatient Prescriptions on File Prior to Visit  Medication Sig Dispense Refill  . ADVAIR DISKUS 250-50 MCG/DOSE AEPB INHALE 1 PUFF BY MOUTH TWICE DAILY 60 each 6  . albuterol (PROVENTIL HFA;VENTOLIN HFA) 108 (90 BASE) MCG/ACT inhaler Inhale 2 puffs into the lungs every 6 (six) hours as needed for wheezing or shortness of breath. 1 Inhaler 5  . aspirin 325 MG tablet Take 325 mg by mouth daily.     Marland Kitchen guaiFENesin (MUCINEX) 600 MG 12 hr tablet Take 600 mg by mouth 2 (two) times daily as needed for to loosen phlegm. For congestion    . ipratropium-albuterol (DUONEB) 0.5-2.5 (3) MG/3ML SOLN Take 3 mLs by nebulization every 4 (four) hours as needed. 360 mL 1  . lisinopril-hydrochlorothiazide (PRINZIDE,ZESTORETIC) 10-12.5 MG per tablet     . loratadine (CLARITIN) 10 MG tablet Take 10 mg by mouth daily as needed.     . metFORMIN (GLUCOPHAGE) 1000 MG tablet Take  1,000 mg by mouth 2 (two) times daily with a meal.     . polyethylene glycol (MIRALAX / GLYCOLAX) packet Take 17 g by mouth daily as needed for moderate constipation. 14 each 0  . senna-docusate (SENOKOT-S) 8.6-50 MG per tablet Take 2 tablets by mouth 2 (two) times daily as needed for moderate constipation. 30 tablet 0   No current facility-administered medications on file prior to visit.    Allergies  Allergen Reactions  . Statins     Failed lipitor and Zocor due to muscle aches  . Sulfonamide Derivatives Other (See Comments)    Patient "drasws up" muscularly

## 2013-12-26 NOTE — Assessment & Plan Note (Signed)
LDL labs greatly improved from 169 to 84 on Praluent.  Prior authorization for medication still not approved, pt receiving med thru [facility] at no charge for now.  He reports no injection site problems.  He has had an increased need for O2 this week, around the clock rather than just at night.  Pt has COPD and has not used Advair today.  Advised he check with pulmonology if problems continue.

## 2014-01-01 ENCOUNTER — Ambulatory Visit (INDEPENDENT_AMBULATORY_CARE_PROVIDER_SITE_OTHER): Payer: Medicare Other | Admitting: Pulmonary Disease

## 2014-01-01 ENCOUNTER — Encounter: Payer: Self-pay | Admitting: Pulmonary Disease

## 2014-01-01 VITALS — BP 118/60 | HR 91 | Temp 97.1°F | Ht 64.0 in | Wt 214.6 lb

## 2014-01-01 DIAGNOSIS — J438 Other emphysema: Secondary | ICD-10-CM

## 2014-01-01 DIAGNOSIS — J9610 Chronic respiratory failure, unspecified whether with hypoxia or hypercapnia: Secondary | ICD-10-CM | POA: Insufficient documentation

## 2014-01-01 DIAGNOSIS — J9611 Chronic respiratory failure with hypoxia: Secondary | ICD-10-CM

## 2014-01-01 NOTE — Progress Notes (Signed)
   Subjective:    Patient ID: William Dyer, male    DOB: May 18, 1945, 68 y.o.   MRN: [id number]  HPI Patient comes in today for follow-up of his known COPD with chronic respiratory failure. He is staying on his Advair and oxygen, and is trying to use a vapor device to stay away from cigarettes. He has not had an acute exacerbation or pulmonary infection since the last visit, but is continuing to have significant dyspnea on exertion. He does not think the Advair is working for him, and would like to try a different inhaler. He was not able to stay on an anticholinergic because of bladder issues.   Review of Systems  Constitutional: Negative for fever and unexpected weight change.  HENT: Positive for congestion and postnasal drip. Negative for dental problem, ear pain, nosebleeds, rhinorrhea, sinus pressure, sneezing, sore throat and trouble swallowing.   Eyes: Negative for redness and itching.  Respiratory: Positive for cough and shortness of breath. Negative for chest tightness and wheezing.   Cardiovascular: Positive for leg swelling. Negative for palpitations.  Gastrointestinal: Negative for nausea and vomiting.  Genitourinary: Negative for dysuria.  Musculoskeletal: Negative for joint swelling.  Skin: Negative for rash.  Neurological: Negative for headaches.  Hematological: Does not bruise/bleed easily.  Psychiatric/Behavioral: Negative for dysphoric mood. The patient is not nervous/anxious.        Objective:   Physical Exam Obese male in no acute distress Nose without purulence or discharge noted Neck without lymphadenopathy or thyromegaly Chest with decreased breath sounds diffusely, no wheezing Cardiac exam with regular rate and rhythm Lower extremities with mild edema, no cyanosis Alert and oriented, moves all 4 extremities.       Assessment & Plan:

## 2014-01-01 NOTE — Assessment & Plan Note (Signed)
The patient continues to have his dyspnea on exertion, with both good and bad days. He does not feel the Advair is working for him, but I explained his major issue is his morbid obesity and deconditioning. I am willing to try him on a different LABA/ICS, but unfortunately he was not able to tolerate anticholinergics. I asked him to continue on his oxygen, and also to work on some type of conditioning program.

## 2014-01-01 NOTE — Patient Instructions (Signed)
Stop advair, and try dulera 100/5  2 inhalations with a spacer am and pm everyday.  Rinse mouth after using. Let us know if you think this works better than advair, and we can send in prescription.  You need to work hard on weight loss.  This will improve your breathing a lot.  folllowup with me again in 67mos.

## 2014-01-17 ENCOUNTER — Telehealth: Payer: Self-pay | Admitting: Cardiovascular Disease

## 2014-01-17 NOTE — Telephone Encounter (Signed)
Spoke with pt.  He has not heard the cost of the medication from his specialty pharmacy yet- he does not even have a pharmacy assigned at this time.  Will work to determine copay next week and follow up with patient.

## 2014-01-17 NOTE — Telephone Encounter (Signed)
New message       pt said ins approved for him to get praluent.  However, he cannot afford it.  Is there something else he can take?

## 2014-01-21 NOTE — Telephone Encounter (Signed)
Spoke with [employer].  They do not have any copay assistance options for Medicare part D patients at this time but may be available after the first part of the year.  Our plan if pt's copay is too expensive is to give him samples until the patient assistance foundations have been established.  If he has an affordable copay, will have him get it through [employer] as planned.  Pt is agreeable to this plan.

## 2014-02-03 ENCOUNTER — Telehealth: Payer: Self-pay | Admitting: Internal Medicine

## 2014-02-03 NOTE — Telephone Encounter (Signed)
Per 11.25.15 ov w. Lupita Leash: Patient Instructions       Stop advair, and try dulera 100/5  2 inhalations with a spacer am and pm everyday.  Rinse mouth after using. Let us know if you think this works better than advair, and we can send in prescription.   You need to work hard on weight loss.  This will improve your breathing a lot.   folllowup with me again in 46mos   Called spoke with patient who reports he has been taking the [facility] x1 month but feels that it has not been any more effective than the Advair.  He reports he did see his PCP x8-9days ago for COPD exacerbation and was given Levaquin, mucinex, Tussionex.  Reports symptoms are improved with some lingering wheezing.  Denies any dyspnea, tightness, cough, hemoptysis, PND, leg swelling.  Pt has finished his Dulera sample as of yesterday evening and would like to know if he could switch back to his Advair 250/50, though he reports he is unable to tell a difference between the two.  [place] is scheduled off all week, will forward to doc of the day for recs.  Dr Melvyn Novas please advise, thank you.

## 2014-02-03 NOTE — Telephone Encounter (Signed)
Fine to change back to advair then and see if cough worsens and if so would rec go back to [place]

## 2014-02-03 NOTE — Telephone Encounter (Signed)
Called, spoke with pt. Discussed below per Dr. Melvyn Novas.  He verbalized understanding and will call office back if cough worsens while on advair. Pt states he has plenty of advair; no rx needed. Pt voiced no further questions or concerns at this time. Will sign off and route to [facility] as FYI.

## 2014-02-05 ENCOUNTER — Telehealth: Payer: Self-pay | Admitting: Cardiovascular Disease

## 2014-02-05 NOTE — Telephone Encounter (Signed)
New Msg         Pt would like a call back in regards to medication.

## 2014-02-06 NOTE — Telephone Encounter (Signed)
Spoke with pt.  Called [facility] and his copay is $175/mo or $462 for 3 months for Praluent.  Will continue to give him samples until the patient assistance foundations have been established and can help him with his copay.  He will come to the office today for samples.

## 2014-02-12 ENCOUNTER — Ambulatory Visit: Payer: Medicare Other | Admitting: Pulmonary Disease

## 2014-02-12 ENCOUNTER — Other Ambulatory Visit: Payer: Self-pay | Admitting: Family Medicine

## 2014-02-12 DIAGNOSIS — R6881 Early satiety: Secondary | ICD-10-CM

## 2014-02-12 DIAGNOSIS — R110 Nausea: Secondary | ICD-10-CM

## 2014-02-18 ENCOUNTER — Ambulatory Visit
Admission: RE | Admit: 2014-02-18 | Discharge: 2014-02-18 | Disposition: A | Discharge status: Home / Self Care | Payer: Medicare Other | Source: Ambulatory Visit | Attending: Family Medicine | Admitting: Family Medicine

## 2014-02-18 DIAGNOSIS — R6881 Early satiety: Secondary | ICD-10-CM

## 2014-02-18 DIAGNOSIS — R110 Nausea: Secondary | ICD-10-CM

## 2014-02-18 MED ORDER — IOHEXOL 300 MG/ML  SOLN
125.0000 mL | Freq: Once | INTRAMUSCULAR | Status: AC | PRN
  Administered 2014-02-18: 125 mL via INTRAVENOUS

## 2014-02-27 ENCOUNTER — Ambulatory Visit (INDEPENDENT_AMBULATORY_CARE_PROVIDER_SITE_OTHER): Payer: Medicare Other | Admitting: Cardiovascular Disease

## 2014-02-27 ENCOUNTER — Encounter: Payer: Self-pay | Admitting: Cardiovascular Disease

## 2014-02-27 VITALS — BP 126/72 | HR 79 | Ht 63.0 in | Wt 207.0 lb

## 2014-02-27 DIAGNOSIS — J410 Simple chronic bronchitis: Secondary | ICD-10-CM

## 2014-02-27 DIAGNOSIS — E785 Hyperlipidemia, unspecified: Secondary | ICD-10-CM

## 2014-02-27 DIAGNOSIS — E782 Mixed hyperlipidemia: Secondary | ICD-10-CM

## 2014-02-27 DIAGNOSIS — I2510 Atherosclerotic heart disease of native coronary artery without angina pectoris: Secondary | ICD-10-CM

## 2014-02-27 DIAGNOSIS — I2583 Coronary atherosclerosis due to lipid rich plaque: Secondary | ICD-10-CM

## 2014-02-27 DIAGNOSIS — Z79899 Other long term (current) drug therapy: Secondary | ICD-10-CM

## 2014-02-27 NOTE — Assessment & Plan Note (Signed)
F/U [facility] continue spiriva

## 2014-02-27 NOTE — Assessment & Plan Note (Signed)
Stable with no angina and good activity level.  Continue medical Rx  

## 2014-02-27 NOTE — Assessment & Plan Note (Signed)
Samples of Praluent given f/u [facility] lipid clinic check labs next week

## 2014-02-27 NOTE — Progress Notes (Signed)
Patient ID: William Dyer, male   DOB: [birth year], 69 y.o.   MRN: [id number] This is a 69 y.o. male patient who has a history of coronary artery disease status post CABG x3 in February 2013. Saw PA 2/14 after spending 6 hours in the emergency room with left arm and shoulder pain. His troponins were negative x2 and EKG unchanged. The patient says his left shoulder and arm have been hurting off and on for a week . He says it comes and goes and is is with ibuprofen. He feels like he may have a shoulder problem but these are this is similar symptoms to when he had his bypass. He denies any chest tightness, pressure, palpitations, dizziness or presyncope. He has had some sweating spells but none associated with the arm pain. He denies any exertional symptoms. His arm hurts to move in certain ways and is biceps is tender to touch. He is on oxygen at night but has not been using it as often as he should. He smokes electronic cigs Sees [facility] chest Xray 4/14 with basilar scarring no CA.   F/U myovue 03/23/12 Normal with EF 64%  LDL down to 86 with 5 mg Crestor has seen lipid clinic but stopped the crestor due to leg pains  Getting samples of Praluent form our pharm D  Chronic pain over right thoracotomy scar   ROS: Denies fever, malais, weight loss, blurry vision, decreased visual acuity, cough, sputum, SOB, hemoptysis, pleuritic pain, palpitaitons, heartburn, abdominal pain, melena, lower extremity edema, claudication, or rash.  All other systems reviewed and negative  General: Affect appropriate Chronically ill obese white male  HEENT: normal Neck supple with no adenopathy JVP normal no bruits no thyromegaly Lungs Rhonchi  no wheezing and good diaphragmatic motion  Right thoracotomy scar  Heart:  S1/S2 no murmur, no rub, gallop or click PMI normal Abdomen: benighn, BS positve, no tenderness, no AAA no bruit.  No HSM or HJR Distal pulses intact with no bruits No edema Neuro non-focal Skin warm and  dry No muscular weakness   Current Outpatient Prescriptions  Medication Sig Dispense Refill  . ADVAIR DISKUS 250-50 MCG/DOSE AEPB INHALE 1 PUFF BY MOUTH TWICE DAILY 60 each 6  . albuterol (PROVENTIL HFA;VENTOLIN HFA) 108 (90 BASE) MCG/ACT inhaler Inhale 2 puffs into the lungs every 6 (six) hours as needed for wheezing or shortness of breath. 1 Inhaler 5  . aspirin 81 MG tablet Take 81 mg by mouth daily.    . fluticasone (FLONASE) 50 MCG/ACT nasal spray Place 1 spray into both nostrils as needed for allergies or rhinitis. 1 spray each nostril every AM    . guaiFENesin (MUCINEX) 600 MG 12 hr tablet Take 600 mg by mouth 2 (two) times daily as needed for to loosen phlegm. For congestion    . ipratropium-albuterol (DUONEB) 0.5-2.5 (3) MG/3ML SOLN Take 3 mLs by nebulization every 4 (four) hours as needed. 360 mL 1  . levothyroxine (SYNTHROID, LEVOTHROID) 137 MCG tablet Take 137 mcg by mouth daily.    Marland Kitchen lisinopril-hydrochlorothiazide (PRINZIDE,ZESTORETIC) 10-12.5 MG per tablet Take 1 tablet by mouth daily.     Marland Kitchen loratadine (CLARITIN) 10 MG tablet Take 10 mg by mouth daily as needed.     . metFORMIN (GLUCOPHAGE) 1000 MG tablet Take 1,000 mg by mouth 2 (two) times daily with a meal.     . senna-docusate (SENOKOT-S) 8.6-50 MG per tablet Take 2 tablets by mouth 2 (two) times daily as needed for moderate constipation. [place]  tablet 0   No current facility-administered medications for this visit.    Allergies  Statins and Sulfonamide derivatives  Electrocardiogram:  ST rate 108 otherwise normal  01/21/14   Assessment and Plan

## 2014-02-27 NOTE — Patient Instructions (Addendum)
Your physician wants you to follow-up in:   [place] will receive a reminder letter in the mail two months in advance. If you don't receive a letter, please call our office to schedule the follow-up appointment. Your physician recommends that you continue on your current medications as directed. Please refer to the Current Medication list given to you today. Your physician recommends that you return for lab work in:  [place]

## 2014-03-31 ENCOUNTER — Other Ambulatory Visit (INDEPENDENT_AMBULATORY_CARE_PROVIDER_SITE_OTHER): Payer: Medicare Other | Admitting: *Deleted

## 2014-03-31 DIAGNOSIS — E785 Hyperlipidemia, unspecified: Secondary | ICD-10-CM

## 2014-03-31 LAB — LIPID PANEL
CHOLESTEROL: 163 mg/dL (ref 0–200)
HDL: 55.7 mg/dL (ref >39.00)
NonHDL: 107.3
Total CHOL/HDL Ratio: 3
Triglycerides: 273 mg/dL — ABNORMAL HIGH (ref 0.0–149.0)
VLDL: 54.6 mg/dL — AB (ref 0.0–40.0)

## 2014-03-31 LAB — HEPATIC FUNCTION PANEL
ALT: 14 U/L (ref 0–53)
AST: 13 U/L (ref 0–37)
Albumin: 4.3 g/dL (ref 3.5–5.2)
Alkaline Phosphatase: 44 U/L (ref 39–117)
Bilirubin, Direct: 0.1 mg/dL (ref 0.0–0.3)
Total Bilirubin: 0.8 mg/dL (ref 0.2–1.2)
Total Protein: 7 g/dL (ref 6.0–8.3)

## 2014-03-31 LAB — LDL CHOLESTEROL, DIRECT: Direct LDL: 72 mg/dL

## 2014-04-03 ENCOUNTER — Ambulatory Visit (INDEPENDENT_AMBULATORY_CARE_PROVIDER_SITE_OTHER): Payer: Medicare Other | Admitting: Pharmacist

## 2014-04-03 VITALS — Wt 210.0 lb

## 2014-04-03 DIAGNOSIS — E785 Hyperlipidemia, unspecified: Secondary | ICD-10-CM

## 2014-04-03 NOTE — Patient Instructions (Signed)
It was a pleasure to meet you today!  Continue taking Praluent 75mg  every 2 weeks. Try to increase your exercise and lean proteins.  Have labs drawn when you come for appointment with Dr. [name].

## 2014-04-03 NOTE — Progress Notes (Signed)
HPI  William Dyer is a 69 yo M pt of Dr. [name] here for follow-up for his hyperlipidemia.  He has a history of CAD s/p CABG x3 in February 2013.  He has failed atorvastatin, simvastatin and rosuvastatin (5mg  three times weekly).  He was started on Praluent 75 mg every 14 days and took his last injection 2 days ago.  His LDL was as high as 170 without statin to 72 today.  Diet He is only cooking for himself now.  He has been eating lots of fish and chicken with vegetables as well as salmon, pork chops, hamburgers and meatloaf.  He rarely eats fried foods.   Exercise Pt does not have a regular exercise routine, but he is active throughout the day.  RF:  CABG x 3 (03/2011), Diabetes, HTN, age - LDL goal < 70, non-HDL goal < 100 Meds:  Crestor 5 mg qd. Intolerant:  Simvastatin and lipitor have both caused muscle aches in the past.  Also failed Crestor 5mg  three times weekly.    Labs:   03/2014: TC 163, LDL 72, TG 273, HDL 55.7, LFTs normal (Praluent 75mg ) 12/2013: TC 181, LDL 84, TG 185, HDL 60.3,  LFTs normal (Praluent 75mg ) 11/2013: TC 287, LDL 169, TG 368, HDL 43.5 LFTs normal (no meds) 05/2013:  TC 202, LDL 86, TG 159, HDL 84, LFTs normal (Crestor 5 mg qd)  Current Outpatient Prescriptions on File Prior to Visit  Medication Sig Dispense Refill  . ADVAIR DISKUS 250-50 MCG/DOSE AEPB INHALE 1 PUFF BY MOUTH TWICE DAILY 60 each 6  . albuterol (PROVENTIL HFA;VENTOLIN HFA) 108 (90 BASE) MCG/ACT inhaler Inhale 2 puffs into the lungs every 6 (six) hours as needed for wheezing or shortness of breath. 1 Inhaler 5  . aspirin 81 MG tablet Take 81 mg by mouth daily.    . fluticasone (FLONASE) 50 MCG/ACT nasal spray Place 1 spray into both nostrils as needed for allergies or rhinitis. 1 spray each nostril every AM    . guaiFENesin (MUCINEX) 600 MG 12 hr tablet Take 600 mg by mouth 2 (two) times daily as needed for to loosen phlegm. For congestion    . ipratropium-albuterol (DUONEB) 0.5-2.5 (3) MG/3ML SOLN  Take 3 mLs by nebulization every 4 (four) hours as needed. 360 mL 1  . levothyroxine (SYNTHROID, LEVOTHROID) 137 MCG tablet Take 137 mcg by mouth daily.    Marland Kitchen lisinopril-hydrochlorothiazide (PRINZIDE,ZESTORETIC) 10-12.5 MG per tablet Take 1 tablet by mouth daily.     Marland Kitchen loratadine (CLARITIN) 10 MG tablet Take 10 mg by mouth daily as needed.     . metFORMIN (GLUCOPHAGE) 1000 MG tablet Take 1,000 mg by mouth 2 (two) times daily with a meal.     . senna-docusate (SENOKOT-S) 8.6-50 MG per tablet Take 2 tablets by mouth 2 (two) times daily as needed for moderate constipation. 30 tablet 0   No current facility-administered medications on file prior to visit.    Allergies  Allergen Reactions  . Statins     Failed lipitor and Zocor due to muscle aches  . Sulfonamide Derivatives Other (See Comments)    Patient "drasws up" muscularly    A/P:  William Dyer is right at his goal of LDL <70 on Praluent 75mg  every 2 weeks. His TG have increased so we discussed diet and exercise habits that may help to reduce those numbers. He was agreeable to getting more exercise (has a [employer]), increasing lean proteins such as chicken and fish, and decreasing his  intake of red meats. We will recheck labs in 6 months and follow up with his results.  William Dyer, PharmD Clinical Pharmacist - Resident Pager: [phone] Pharmacy: [phone] 04/03/2014 11:53 AM

## 2014-04-15 ENCOUNTER — Telehealth: Payer: Self-pay

## 2014-04-16 NOTE — Telephone Encounter (Signed)
Spoke with speciality pharmacy.  Clarification given on Rx for Praluent.

## 2014-06-05 ENCOUNTER — Telehealth: Payer: Self-pay | Admitting: Cardiovascular Disease

## 2014-06-05 NOTE — Telephone Encounter (Signed)
New Prob    Pt states he needs to come pick up his Praleunt injections and calling to verify these are ready for him. Please call back.

## 2014-06-05 NOTE — Telephone Encounter (Signed)
Spoke with pt.  Since the [place] program has ended, the pharmacy was requesting him to pay the $170 copay.  Gave him the information to the [place] to call and see if he qualifies for assistance.

## 2014-06-09 ENCOUNTER — Telehealth: Payer: Self-pay | Admitting: Pharmacist

## 2014-06-09 NOTE — Telephone Encounter (Signed)
Follow Up       Pt calling to speak to [facility] about Preluent. Please call back and advise.

## 2014-06-09 NOTE — Telephone Encounter (Signed)
Spoke with pt.  Have talked with [facility] and given pt assistance information.  Pt will now need to call pharmacy to set up delivery.

## 2014-07-02 ENCOUNTER — Ambulatory Visit: Payer: Medicare Other | Admitting: Pulmonary Disease

## 2014-07-21 ENCOUNTER — Other Ambulatory Visit: Payer: Self-pay | Admitting: Pulmonary Disease

## 2014-08-05 ENCOUNTER — Ambulatory Visit (INDEPENDENT_AMBULATORY_CARE_PROVIDER_SITE_OTHER): Payer: Medicare Other | Admitting: Pulmonary Disease

## 2014-08-05 ENCOUNTER — Encounter: Payer: Self-pay | Admitting: Pulmonary Disease

## 2014-08-05 VITALS — BP 118/64 | HR 84 | Ht 64.0 in | Wt 206.0 lb

## 2014-08-05 DIAGNOSIS — J432 Centrilobular emphysema: Secondary | ICD-10-CM | POA: Diagnosis not present

## 2014-08-05 DIAGNOSIS — C3431 Malignant neoplasm of lower lobe, right bronchus or lung: Secondary | ICD-10-CM | POA: Diagnosis not present

## 2014-08-05 DIAGNOSIS — R5383 Other fatigue: Secondary | ICD-10-CM | POA: Insufficient documentation

## 2014-08-05 MED ORDER — ALBUTEROL SULFATE 108 (90 BASE) MCG/ACT IN AEPB: 1.0000 | INHALATION_SPRAY | Freq: Four times a day (QID) | RESPIRATORY_TRACT | Status: DC | PRN

## 2014-08-05 MED ORDER — BUDESONIDE 0.25 MG/2ML IN SUSP: 0.2500 mg | Freq: Two times a day (BID) | RESPIRATORY_TRACT | Status: DC

## 2014-08-05 MED ORDER — ARFORMOTEROL TARTRATE 15 MCG/2ML IN NEBU: 15.0000 ug | INHALATION_SOLUTION | Freq: Two times a day (BID) | RESPIRATORY_TRACT | Status: DC

## 2014-08-05 NOTE — Progress Notes (Signed)
Subjective:    Patient ID: William Dyer, male    DOB: 03-01-1945, 69 y.o.   MRN: [id number]  Synopsis: [facility] paitent with COPD and a history of lung cacner. RLL Lobectomy 2001 at [facility] > adenocarinoma PFT's 2009:  FEV1 1.73 (63%), ratio 55, DLCO 50% Oxygen 1liter at HS only  Urinary issues with spiriva, can't afford tudorza Trial of spiriva respimat 08/2013 >> still with urinary retention issues   HPI Chief Complaint  Patient presents with  . Follow-up    Former [place] pt.  Pt states his advair is not helping his sob- pt has increased DOE with any exertion.  Pt is interested in starting daily nebulizers and cpap.    More short of breath with minimal exertion. Takes advair, but not helping. Spiriva helped, but he had urinary side effects.  Now on flomax which has helped. He is up a lot at night to urinate frequently, at least 3-4 times a day. He feels fatigued, doesn't sleep well at night. He feels sleepy in the daytime, falls a sleep easily. He had a polysomnogram years ago.   [facility] now doesn't have the wind for it.   Past Medical History  Diagnosis Date  . Hyperlipidemia   . CAD (coronary artery disease)     Remote PCI; s/p CABG x 3 in Feb 2013  . Lung cancer 07/2000    status post right lower lobectomy for adenocarcinoma  . Heart murmur   . COPD (chronic obstructive pulmonary disease)   . Pneumonia   . Hypothyroidism   . Diabetes mellitus   . GERD (gastroesophageal reflux disease)   . Obesity       Review of Systems  Constitutional: Positive for fatigue. Negative for fever and chills.  HENT: Negative for postnasal drip, rhinorrhea and sinus pressure.   Respiratory: Positive for shortness of breath. Negative for cough and wheezing.   Cardiovascular: Negative for chest pain, palpitations and leg swelling.       Objective:   Physical Exam  Filed Vitals:   08/05/14 1048  BP: 118/64  Pulse: 84  Height: '5\' 4"'$  (1.626 m)    Weight: 206 lb (93.441 kg)  SpO2: 94%  2L O2  Gen: obese, chronically ill appearing HENT: OP clear, neck supple PULM: wheezing bases, normal air movement CV: RRR, no mgr, trace edema GI: BS+, soft, nontender Derm: no cyanosis or rash Psyche: normal mood and affect   Recent cardiology records reviewed where he was seen and found to have stable coronary artery disease July 2015 CT chest images personally reviewed> no pulmonary embolism, there is severe emphysema in the upper lobes bilaterally, no fibrotic changes noted, postsurgical changes in the right lower lobe    Assessment & Plan:  COPD (chronic obstructive pulmonary disease) He only has moderate airflow obstruction but he has gold grade D symptoms based on the fact that he is severely short of breath. Fortunately he does not have frequent exacerbations.  He does not feel the Advair is working, therefore we are going to change to a nebulized form of a long-acting beta agonist and inhaled cortical steroid.  I am reluctant to use an anticholinergic considering the multiple episodes that have been associated with urinary retention with trials of Spiriva in the past.  Clearly his obesity, deconditioning and ongoing tobacco use are contributing.  Plan: Quit smoking altogether, he was counseled to quit today Increase exercise at the gym Stop Advair  Start Brovana and Pulmicort nebulized Continue using oxygen as prescribed If no improvement in shortness of breath then consider half dose Spiriva as his urinary retention has improved with the addition of Flomax Follow-up 2 months  Lung cancer History of adenocarcinoma, no evidence of recurrence on the July 2015 CT chest.   Plan: Counseled at length to quit smoking today. Needs a referral for low-dose cancer screening.  Fatigue He has daytime somnolence, multiple awakenings at night, obesity, and COPD all of which make the likelihood of obstructive sleep apnea very high.  Apparently he had a sleep study in the past that never took CPAP. He is now interested.   Plan: Split-night study     Current outpatient prescriptions:  .  albuterol (PROVENTIL HFA;VENTOLIN HFA) 108 (90 BASE) MCG/ACT inhaler, Inhale 2 puffs into the lungs every 6 (six) hours as needed for wheezing or shortness of breath., Disp: 1 Inhaler, Rfl: 5 .  Alirocumab (PRALUENT [place]), Inject into the skin every 14 (fourteen) days., Disp: , Rfl:  .  aspirin 81 MG tablet, Take 81 mg by mouth daily., Disp: , Rfl:  .  fluticasone (FLONASE) 50 MCG/ACT nasal spray, Place 1 spray into both nostrils as needed for allergies or rhinitis. 1 spray each nostril every AM, Disp: , Rfl:  .  guaiFENesin (MUCINEX) 600 MG 12 hr tablet, Take 600 mg by mouth 2 (two) times daily as needed for to loosen phlegm. For congestion, Disp: , Rfl:  .  ipratropium-albuterol (DUONEB) 0.5-2.5 (3) MG/3ML SOLN, Take 3 mLs by nebulization every 4 (four) hours as needed., Disp: 360 mL, Rfl: 1 .  levothyroxine (SYNTHROID, LEVOTHROID) 137 MCG tablet, Take 137 mcg by mouth daily., Disp: , Rfl:  .  lisinopril-hydrochlorothiazide (PRINZIDE,ZESTORETIC) 10-12.5 MG per tablet, Take 1 tablet by mouth daily. , Disp: , Rfl:  .  loratadine (CLARITIN) 10 MG tablet, Take 10 mg by mouth daily as needed. , Disp: , Rfl:  .  metFORMIN (GLUCOPHAGE) 1000 MG tablet, Take 1,000 mg by mouth 2 (two) times daily with a meal. , Disp: , Rfl:  .  senna-docusate (SENOKOT-S) 8.6-50 MG per tablet, Take 2 tablets by mouth 2 (two) times daily as needed for moderate constipation., Disp: 30 tablet, Rfl: 0 .  tamsulosin (FLOMAX) 0.4 MG CAPS capsule, Take 0.4 mg by mouth daily., Disp: , Rfl:  .  Albuterol Sulfate (PROAIR [place]PIC[place]ICK) 427 (90 BASE) MCG/ACT AEPB, Inhale 1 puff into the lungs every 6 (six) hours as needed., Disp: 1 each, Rfl: 0 .  arformoterol (BROVANA) 15 MCG/2ML NEBU, Take 2 mLs (15 mcg total) by nebulization 2 (two) times daily., Disp: 120 mL, Rfl: 11 .   budesonide (PULMICORT) 0.25 MG/2ML nebulizer solution, Take 2 mLs (0.25 mg total) by nebulization 2 (two) times daily., Disp: 60 mL, Rfl: 11

## 2014-08-05 NOTE — Assessment & Plan Note (Signed)
He has daytime somnolence, multiple awakenings at night, obesity, and COPD all of which make the likelihood of obstructive sleep apnea very high. Apparently he had a sleep study in the past that never took CPAP. He is now interested.   Plan: Split-night study

## 2014-08-05 NOTE — Patient Instructions (Signed)
We will prescribe Pulmicort and Brovana nebulized twice a day Stopped taking Advair Increase your activity and exercise Continue using albuterol as needed only We will arrange a sleep study Quit smoking cigarettes We will see you back in 2 months or sooner if needed

## 2014-08-05 NOTE — Assessment & Plan Note (Signed)
History of adenocarcinoma, no evidence of recurrence on the July 2015 CT chest.   Plan: Counseled at length to quit smoking today. Needs a referral for low-dose cancer screening.

## 2014-08-05 NOTE — Assessment & Plan Note (Signed)
He only has moderate airflow obstruction but he has gold grade D symptoms based on the fact that he is severely short of breath. Fortunately he does not have frequent exacerbations.  He does not feel the Advair is working, therefore we are going to change to a nebulized form of a long-acting beta agonist and inhaled cortical steroid.  I am reluctant to use an anticholinergic considering the multiple episodes that have been associated with urinary retention with trials of Spiriva in the past.  Clearly his obesity, deconditioning and ongoing tobacco use are contributing.  Plan: Quit smoking altogether, he was counseled to quit today Increase exercise at the gym Stop Advair Start Brovana and Pulmicort nebulized Continue using oxygen as prescribed If no improvement in shortness of breath then consider half dose Spiriva as his urinary retention has improved with the addition of Flomax Follow-up 2 months

## 2014-08-06 ENCOUNTER — Telehealth: Payer: Self-pay | Admitting: Pulmonary Disease

## 2014-08-06 NOTE — Telephone Encounter (Signed)
Per 08/05/14 OV: Patient Instructions       We will prescribe Pulmicort and Brovana nebulized twice a day Stopped taking Advair Increase your activity and exercise Continue using albuterol as needed only We will arrange a sleep study Quit smoking cigarettes We will see you back in 2 months or sooner if needed   Spoke with pt. He was confirming on how to take his neb medications. Nothing further needed

## 2014-08-07 ENCOUNTER — Telehealth: Payer: Self-pay | Admitting: Pulmonary Disease

## 2014-08-07 ENCOUNTER — Other Ambulatory Visit: Payer: Self-pay | Admitting: Acute Care

## 2014-08-07 DIAGNOSIS — Z87891 Personal history of nicotine dependence: Secondary | ICD-10-CM

## 2014-08-07 NOTE — Telephone Encounter (Signed)
Spoke with pt, clarified that he is to use his nebs 1 dose twice daily.  Nothing further needed.

## 2014-08-13 ENCOUNTER — Ambulatory Visit (INDEPENDENT_AMBULATORY_CARE_PROVIDER_SITE_OTHER)
Admission: RE | Admit: 2014-08-13 | Discharge: 2014-08-13 | Disposition: A | Discharge status: Home / Self Care | Payer: Medicare Other | Source: Ambulatory Visit | Attending: Acute Care | Admitting: Acute Care

## 2014-08-13 ENCOUNTER — Ambulatory Visit (INDEPENDENT_AMBULATORY_CARE_PROVIDER_SITE_OTHER): Payer: Medicare Other | Admitting: Acute Care

## 2014-08-13 ENCOUNTER — Encounter: Payer: Self-pay | Admitting: Acute Care

## 2014-08-13 DIAGNOSIS — Z87891 Personal history of nicotine dependence: Secondary | ICD-10-CM

## 2014-08-13 NOTE — Progress Notes (Signed)
Shared Decision Making Visit Lung Cancer Screening Program [phone])   Eligibility:  Age 69 y.o.  Pack Years Smoking History Calculation 75 pack years (# packs/per year x # years smoked)  Recent History of coughing up blood  no  Unexplained weight loss? no ( >Than 15 pounds within the last 6 months )  Prior History Lung / other cancer yes, in 2002: Had lobectomy and cure. (Diagnosis within the last 5 years already requiring surveillance chest CT Scans).  Smoking Status Current Smoker  Former Smokers: Years since quit: NA  Quit Date: NA  Visit Components:  Discussion included one or more decision making aids. yes  Discussion included risk/benefits of screening. yes  Discussion included potential follow up diagnostic testing for abnormal scans. yes  Discussion included meaning and risk of over diagnosis. yes  Discussion included meaning and risk of False Positives. yes  Discussion included meaning of total radiation exposure. yes  Counseling Included:  Importance of adherence to annual lung cancer LDCT screening. yes  Impact of comorbidities on ability to participate in the program. yes  Ability and willingness to under diagnostic treatment. yes  Smoking Cessation Counseling:  Current Smokers:   Discussed importance of smoking cessation. yes  Information about tobacco cessation classes and interventions provided to patient. yes  Patient provided with "ticket" for LDCT Scan. yes  Symptomatic Patient. no  Counseling: NA  Diagnosis Code: Tobacco Use Z72.0  Asymptomatic Patient yes  Counseling (Intermediate counseling: > three minutes counseling) [id number]  Former Smokers:   Discussed the importance of maintaining cigarette abstinence. NA; Current smoker  Diagnosis Code: Personal History of Nicotine Dependence. P53.614  Information about tobacco cessation classes and interventions provided to patient. Yes  Patient provided with "ticket" for LDCT Scan.  yes  Written Order for Lung Cancer Screening with LDCT placed in Epic. Yes (CT Chest Lung Cancer Screening Low Dose W/O CM) [id number] Z12.2-Screening of respiratory organs Z87.891-Personal history of nicotine dependence  I spent 15 minutes of face to face time with Mr. Whitebread explaining the risks and benefits of the lung cancer screening program.We viewed a power point together that addressed the above noted issues. We took time to discuss each slide allowing time for questions to be asked and answered.We discussed that Mr. Toral has started smoking again, about 10-12 cigarettes per week. We discussed that the single most powerful thing he can do to decrease his risk of lung cancer is to stop smoking. He verbalized understanding. I gave him the " Be stronger than your excuses" card and told him to let me know when he was ready to quit and I would help him find the resources and support to do so.He verbalized understanding. We discussed that this is a commitment by him to scan annually as a preventative health maintenance. He verbalized understanding and agreement. Takeaways from this appointment include a copy of the power point presentation to refer to as needed, the be stronger than your excuses card, by business card and contact information, and the ticket to ride the scanner. He verbally confirmed location and time of CT appointment. I told him I will call him with the scan results. He verbalized understanding and had no further questions.This is a delightful patient.   Magdalen Spatz, NP

## 2014-08-14 ENCOUNTER — Telehealth: Payer: Self-pay | Admitting: Acute Care

## 2014-08-14 NOTE — Telephone Encounter (Signed)
I called and spoke with [name]. I told him that his LDCT resulted as a Lung RADS 2, which means nodules present have a benign appearance and behavior, with a very low likelihood of becoming a clinically active cancer due to size or lack of growth. He verbalized understanding. I told him the recommendation was for a repeat annual scan in 12 months. I also reminded him that if he had any unexplained weight loss, or started coughing up blood to contact either [place] or his PCP immediately to allow for a follow up  sooner. He verbalized understanding of all of the above. He has my contact information in the event he needs to get in touch with me, or has any questions in the future.

## 2014-09-02 NOTE — Progress Notes (Signed)
Patient ID: William Dyer, male   DOB: October 02, 1945, 69 y.o.   MRN: [id number] This is a 69 y.o. male patient who has a history of coronary artery disease status post CABG x3 in February 2013.   F/U myovue 03/23/12 Normal with EF 64%   Getting samples of Praluent form our pharm D  LDL down to 72 from 170  Chronic pain over right thoracotomy scar  Giving himself praluant shots  Breathing issues seeing [facility] now Using nebulizer   ROS: Denies fever, malais, weight loss, blurry vision, decreased visual acuity, cough, sputum, SOB, hemoptysis, pleuritic pain, palpitaitons, heartburn, abdominal pain, melena, lower extremity edema, claudication, or rash.  All other systems reviewed and negative  General: Affect appropriate Chronically ill obese white male  HEENT: normal Neck supple with no adenopathy JVP normal no bruits no thyromegaly Lungs Rhonchi  no wheezing and good diaphragmatic motion  Right thoracotomy scar  Heart:  S1/S2 no murmur, no rub, gallop or click PMI normal Abdomen: benighn, BS positve, no tenderness, no AAA no bruit.  No HSM or HJR Distal pulses intact with no bruits No edema Neuro non-focal Skin warm and dry No muscular weakness   Current Outpatient Prescriptions  Medication Sig Dispense Refill  . albuterol (PROVENTIL HFA;VENTOLIN HFA) 108 (90 BASE) MCG/ACT inhaler Inhale 2 puffs into the lungs every 6 (six) hours as needed for wheezing or shortness of breath. 1 Inhaler 5  . Albuterol Sulfate (PROAIR RESPICLICK) 163 (90 BASE) MCG/ACT AEPB Inhale 1 puff into the lungs every 6 (six) hours as needed. 1 each 0  . Alirocumab (PRALUENT [place]) Inject into the skin every 14 (fourteen) days.    Marland Kitchen arformoterol (BROVANA) 15 MCG/2ML NEBU Take 2 mLs (15 mcg total) by nebulization 2 (two) times daily. 120 mL 11  . aspirin 81 MG tablet Take 81 mg by mouth daily.    . budesonide (PULMICORT) 0.25 MG/2ML nebulizer solution Take 2 mLs (0.25 mg total) by nebulization 2 (two) times daily. 60  mL 11  . fluticasone (FLONASE) 50 MCG/ACT nasal spray Place 1 spray into both nostrils as needed for allergies or rhinitis. 1 spray each nostril every AM    . guaiFENesin (MUCINEX) 600 MG 12 hr tablet Take 600 mg by mouth 2 (two) times daily as needed for to loosen phlegm. For congestion    . ipratropium-albuterol (DUONEB) 0.5-2.5 (3) MG/3ML SOLN Take 3 mLs by nebulization every 4 (four) hours as needed. 360 mL 1  . levothyroxine (SYNTHROID, LEVOTHROID) 137 MCG tablet Take 137 mcg by mouth daily.    Marland Kitchen lisinopril-hydrochlorothiazide (PRINZIDE,ZESTORETIC) 10-12.5 MG per tablet Take 1 tablet by mouth daily.     Marland Kitchen loratadine (CLARITIN) 10 MG tablet Take 10 mg by mouth daily as needed.     . metFORMIN (GLUCOPHAGE) 1000 MG tablet Take 1,000 mg by mouth 2 (two) times daily with a meal.     . senna-docusate (SENOKOT-S) 8.6-50 MG per tablet Take 2 tablets by mouth 2 (two) times daily as needed for moderate constipation. 30 tablet 0  . tamsulosin (FLOMAX) 0.4 MG CAPS capsule Take 0.4 mg by mouth daily.     No current facility-administered medications for this visit.    Allergies  Statins and Sulfonamide derivatives  Electrocardiogram:  ST rate 108 otherwise normal  01/21/14  09/03/14  SR rate 84  RBBB  RBBB new since previous   Assessment and Plan CAD: Stable with no angina and good activity level.  Continue medical Rx  Chol: new 3  month supply sent into [facility]  LDL much better   DM: Discussed low carb diet.  Target hemoglobin A1c is 6.5 or less.  Continue current medications.  COPD:  Nebulizer helps Humidity is hard for him  F/U [facility]  Thyroid    Lab Results  Component Value Date   TSH 1.380 05/13/2013    RBBB   New on ECG likely related to lung disease f/u ECG in 6 months

## 2014-09-03 ENCOUNTER — Other Ambulatory Visit: Payer: Self-pay | Admitting: Pharmacist

## 2014-09-03 ENCOUNTER — Ambulatory Visit (INDEPENDENT_AMBULATORY_CARE_PROVIDER_SITE_OTHER): Payer: Medicare Other | Admitting: Cardiovascular Disease

## 2014-09-03 ENCOUNTER — Encounter: Payer: Self-pay | Admitting: Cardiovascular Disease

## 2014-09-03 VITALS — BP 140/72 | HR 84 | Ht 63.0 in | Wt 201.8 lb

## 2014-09-03 DIAGNOSIS — I2510 Atherosclerotic heart disease of native coronary artery without angina pectoris: Secondary | ICD-10-CM | POA: Diagnosis not present

## 2014-09-03 MED ORDER — ALIROCUMAB 75 MG/ML ~~LOC~~ SOPN
75.0000 mg | PEN_INJECTOR | SUBCUTANEOUS | Status: DC
Start: 2014-09-03 — End: 2015-03-25

## 2014-09-03 NOTE — Patient Instructions (Signed)
Medication Instructions:  NO CHANGES  Labwork: NONE  Testing/Procedures: NONE  Follow-Up: Your physician wants you to follow-up in: 6 MONTHS WITH DR NISHAN You will receive a reminder letter in the mail two months in advance. If you don't receive a letter, please call our office to schedule the follow-up appointment.  Any Other Special Instructions Will Be Listed Below (If Applicable).   

## 2014-09-15 ENCOUNTER — Telehealth: Payer: Self-pay

## 2014-09-15 NOTE — Telephone Encounter (Signed)
Patient called in stating that he needed to speak with Gay Filler concerning the [facility] shot he has gotten.  He denied any symptoms or problems  But stated that he has his prescriptions "all messed up".  The information was forwarded on to the pharm office here at [facility] to return his call.

## 2014-10-06 ENCOUNTER — Ambulatory Visit (INDEPENDENT_AMBULATORY_CARE_PROVIDER_SITE_OTHER): Payer: Medicare Other | Admitting: Pulmonary Disease

## 2014-10-06 ENCOUNTER — Encounter: Payer: Self-pay | Admitting: Pulmonary Disease

## 2014-10-06 VITALS — BP 124/68 | HR 84 | Ht 64.0 in | Wt 202.0 lb

## 2014-10-06 DIAGNOSIS — J432 Centrilobular emphysema: Secondary | ICD-10-CM

## 2014-10-06 NOTE — Patient Instructions (Signed)
Follow-up with your sleep study We will refer you to pulmonary rehabilitation Keep taking your medicines as you're doing We will see you back in 6-8 weeks or sooner if needed

## 2014-10-06 NOTE — Progress Notes (Signed)
Subjective:    Patient ID: CACE OSORTO, male    DOB: 1945/02/27, 69 y.o.   MRN: [id number]  Synopsis: [facility] paitent with COPD and a history of lung cacner. 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 Follow-up    Pt has good and bad days with breathing depending on heat/humidity. Still not able to do any activity   Mr. Siler says that he is doing pretty well.   He thinks that the pulmicort and the Brovana have helped some.   He says the hot weather is still still bothering him. He is currently paying $194 a month from [employer] for it. He definitely thinks that the pulmicort and brovana are better than Advair but they are too expensive and take a long time.   He has used the proAir isn't helping at all.    Past Medical History  Diagnosis Date  . Hyperlipidemia   . CAD (coronary artery disease)     Remote PCI; s/p CABG x 3 in Feb 2013  . Lung cancer 07/2000    status post right lower lobectomy for adenocarcinoma  . Heart murmur   . COPD (chronic obstructive pulmonary disease)   . Pneumonia   . Hypothyroidism   . Diabetes mellitus   . GERD (gastroesophageal reflux disease)   . Obesity       Review of Systems  Constitutional: Positive for fatigue. Negative for fever and chills.  HENT: Negative for postnasal drip, rhinorrhea and sinus pressure.   Respiratory: Positive for shortness of breath. Negative for cough and wheezing.   Cardiovascular: Negative for chest pain, palpitations and leg swelling.       Objective:   Physical Exam  Filed Vitals:   10/06/14 1118  BP: 124/68  Pulse: 84  Height: '5\' 4"'$  (1.626 m)  Weight: 202 lb (91.627 kg)  SpO2: 95%  2L O2  Gen: obese, chronically ill appearing HENT: OP clear, neck  supple PULM: wheezing bases, normal air movement CV: RRR, no mgr, trace edema GI: BS+, soft, nontender Derm: no cyanosis or rash Psyche: normal mood and affect   Recent cardiology records reviewed where he was seen and found to have stable coronary artery disease July 2015 CT chest images personally reviewed> no pulmonary embolism, there is severe emphysema in the upper lobes bilaterally, no fibrotic changes noted, postsurgical changes in the right lower lobe    Assessment & Plan:  COPD (chronic obstructive pulmonary disease) He continues to struggle with severe dyspnea. He only had moderate airflow obstruction in 2009 my suspicion is that has progressed. He is profoundly deconditioned which is complicating this, and he likely has untreated obstructive sleep apnea.  His recent CT chest did not show evidence of an additional lung disease.  Plan: Pulmonary rehabilitation referral Simple spirometry testing with pulmonary rehabilitation Continue Brovana and Pulmicort, Follow-up in 6-8 weeks or sooner if needed     Current outpatient prescriptions:  .  albuterol (PROVENTIL HFA;VENTOLIN HFA) 108 (90 BASE) MCG/ACT inhaler, Inhale 2 puffs into the lungs every 6 (six) hours as needed for wheezing or shortness of breath., Disp: 1 Inhaler, Rfl: 5 .  Alirocumab (PRALUENT) 75 MG/ML SOPN, Inject 75 mg into the skin every 14 (fourteen) days., Disp: 2  pen, Rfl: 6 .  arformoterol (BROVANA) 15 MCG/2ML NEBU, Take 2 mLs (15 mcg total) by nebulization 2 (two) times daily., Disp: 120 mL, Rfl: 11 .  aspirin 81 MG tablet, Take 81 mg by mouth daily., Disp: , Rfl:  .  budesonide (PULMICORT) 0.25 MG/2ML nebulizer solution, Take 2 mLs (0.25 mg total) by nebulization 2 (two) times daily., Disp: 60 mL, Rfl: 11 .  fluticasone (FLONASE) 50 MCG/ACT nasal spray, Place 1 spray into both nostrils as needed for allergies or rhinitis. 1 spray each nostril every AM, Disp: , Rfl:  .  guaiFENesin (MUCINEX) 600 MG 12 hr  tablet, Take 600 mg by mouth 2 (two) times daily as needed for to loosen phlegm. For congestion, Disp: , Rfl:  .  ipratropium-albuterol (DUONEB) 0.5-2.5 (3) MG/3ML SOLN, Take 3 mLs by nebulization every 4 (four) hours as needed., Disp: 360 mL, Rfl: 1 .  levothyroxine (SYNTHROID, LEVOTHROID) 137 MCG tablet, Take 137 mcg by mouth daily., Disp: , Rfl:  .  lisinopril-hydrochlorothiazide (PRINZIDE,ZESTORETIC) 10-12.5 MG per tablet, Take 1 tablet by mouth daily. , Disp: , Rfl:  .  loratadine (CLARITIN) 10 MG tablet, Take 10 mg by mouth daily as needed. , Disp: , Rfl:  .  metFORMIN (GLUCOPHAGE) 1000 MG tablet, Take 1,000 mg by mouth 2 (two) times daily with a meal. , Disp: , Rfl:  .  senna-docusate (SENOKOT-S) 8.6-50 MG per tablet, Take 2 tablets by mouth 2 (two) times daily as needed for moderate constipation., Disp: 30 tablet, Rfl: 0 .  tamsulosin (FLOMAX) 0.4 MG CAPS capsule, Take 0.4 mg by mouth daily., Disp: , Rfl:

## 2014-10-06 NOTE — Assessment & Plan Note (Signed)
He continues to struggle with severe dyspnea. He only had moderate airflow obstruction in 2009 my suspicion is that has progressed. He is profoundly deconditioned which is complicating this, and he likely has untreated obstructive sleep apnea.  His recent CT chest did not show evidence of an additional lung disease.  Plan: Pulmonary rehabilitation referral Simple spirometry testing with pulmonary rehabilitation Continue Brovana and Pulmicort, Follow-up in 6-8 weeks or sooner if needed

## 2014-10-27 ENCOUNTER — Ambulatory Visit (HOSPITAL_BASED_OUTPATIENT_CLINIC_OR_DEPARTMENT_OTHER): Payer: Medicare Other | Attending: Pulmonary Disease

## 2014-10-27 VITALS — Ht 64.0 in | Wt 205.0 lb

## 2014-10-27 DIAGNOSIS — J449 Chronic obstructive pulmonary disease, unspecified: Secondary | ICD-10-CM | POA: Insufficient documentation

## 2014-10-27 DIAGNOSIS — G4761 Periodic limb movement disorder: Secondary | ICD-10-CM | POA: Diagnosis not present

## 2014-10-27 DIAGNOSIS — Z9981 Dependence on supplemental oxygen: Secondary | ICD-10-CM | POA: Diagnosis not present

## 2014-10-27 DIAGNOSIS — R0683 Snoring: Secondary | ICD-10-CM | POA: Diagnosis not present

## 2014-10-27 DIAGNOSIS — G4736 Sleep related hypoventilation in conditions classified elsewhere: Secondary | ICD-10-CM | POA: Insufficient documentation

## 2014-10-27 DIAGNOSIS — R5383 Other fatigue: Secondary | ICD-10-CM | POA: Diagnosis present

## 2014-10-27 DIAGNOSIS — G4733 Obstructive sleep apnea (adult) (pediatric): Secondary | ICD-10-CM | POA: Diagnosis present

## 2014-11-07 DIAGNOSIS — G4733 Obstructive sleep apnea (adult) (pediatric): Secondary | ICD-10-CM | POA: Diagnosis not present

## 2014-11-07 NOTE — Progress Notes (Signed)
Patient Name: William Dyer, William Dyer Date: 10/27/2014 Gender: Male D.O.B: 08-06-1945 Age (years): 37 Referring Provider: Simonne Maffucci Height (inches): 56 Interpreting Physician: Chesley Mires MD, ABSM Weight (lbs): 205 RPSGT: Joni Reining BMI: 35 MRN: [id number] Neck Size: 17.50  CLINICAL INFORMATION Sleep Study Type: NPSG  Indication for sleep study: Fatigue.  He has history of COPD on home oxygen at 2 liters.  Epworth Sleepiness Score: 6  SLEEP STUDY TECHNIQUE As per the AASM Manual for the Scoring of Sleep and Associated Events v2.3 (April 2016) with a hypopnea requiring 4% desaturations.  The channels recorded and monitored were frontal, central and occipital EEG, electrooculogram (EOG), submentalis EMG (chin), nasal and oral airflow, thoracic and abdominal wall motion, anterior tibialis EMG, snore microphone, electrocardiogram, and pulse oximetry.  MEDICATIONS Patient's medications include: reviewed in electronic medical record. Medications self-administered by patient during sleep study : No sleep medicine administered.  SLEEP ARCHITECTURE The study was initiated at 10:04:15 PM and ended at 4:06:42 AM.  Sleep onset time was 43.9 minutes and the sleep efficiency was 49.4%. The total sleep time was 179.0 minutes.  Stage REM latency was 163.5 minutes.  The patient spent 4.47% of the night in stage N1 sleep, 86.03% in stage N2 sleep, 0.00% in stage N3 and 9.50% in REM.  Alpha intrusion was absent.  Supine sleep was 0.00%.  RESPIRATORY PARAMETERS The overall apnea/hypopnea index (AHI) was 5.7 per hour. There were 4 total apneas, including 4 obstructive, 0 central and 0 mixed apneas. There were 13 hypopneas and 28 RERAs.  The AHI during Stage REM sleep was 35.3 per hour.  AHI while supine was N/A per hour.  The study was started without the patient using supplemental oxygen.  The mean oxygen saturation was 90.28%. The minimum SpO2 during sleep was 76.00%.  Due to  persistent oxygen desaturaion, 2 liters of oxygen was applied during the study.  Loud snoring was noted during this study.  CARDIAC DATA The 2 lead EKG demonstrated sinus rhythm. The mean heart rate was 94.02 beats per minute. Other EKG findings include: none.  LEG MOVEMENT DATA The total PLMS were 157 with a resulting PLMS index of 52.63. Associated arousal with leg movement index was 2.0 .  IMPRESSIONS Mild obstructive sleep apnea occurred during this study (AHI = 5.7/h). No significant central sleep apnea occurred during this study (CAI = 0.0/h). Moderate oxygen desaturation was noted during this study (Min O2 = 76.00%). The patient snored with Loud snoring volume.. Severe periodic limb movements of sleep occurred during the study. No significant associated arousals.  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  Would start study on room air, and then add supplemental oxygen as needed once his obstructive respiratory events are controlled.  Given his history of COPD, he might need BiPAP instead of CPAP.   Chesley Mires, MD, [place], American Board of Sleep Medicine 11/07/2014, 6:44 PM  NPI: [id number]

## 2014-11-17 ENCOUNTER — Encounter: Payer: Self-pay | Admitting: Pulmonary Disease

## 2014-11-17 DIAGNOSIS — G4733 Obstructive sleep apnea (adult) (pediatric): Secondary | ICD-10-CM | POA: Insufficient documentation

## 2014-11-18 ENCOUNTER — Ambulatory Visit: Payer: Medicare Other | Admitting: Pulmonary Disease

## 2014-11-18 ENCOUNTER — Telehealth: Payer: Self-pay

## 2014-11-18 DIAGNOSIS — G4733 Obstructive sleep apnea (adult) (pediatric): Secondary | ICD-10-CM

## 2014-11-18 NOTE — Telephone Encounter (Signed)
A,        According to Dr. Halford Chessman William Dyer has sleep apnea. Please let him know this means he needs a CPAP titration study in the sleep lab.        Thanks    B    Pt aware of results/recs.  cpap titration study ordered.  Nothing further needed.

## 2014-12-02 ENCOUNTER — Other Ambulatory Visit: Payer: Self-pay | Admitting: *Deleted

## 2014-12-02 MED ORDER — ALBUTEROL SULFATE 108 (90 BASE) MCG/ACT IN AEPB: 1.0000 | INHALATION_SPRAY | Freq: Four times a day (QID) | RESPIRATORY_TRACT | Status: DC | PRN

## 2014-12-22 ENCOUNTER — Ambulatory Visit (INDEPENDENT_AMBULATORY_CARE_PROVIDER_SITE_OTHER): Payer: Medicare Other | Admitting: Pulmonary Disease

## 2014-12-22 ENCOUNTER — Encounter: Payer: Self-pay | Admitting: Pulmonary Disease

## 2014-12-22 VITALS — BP 124/68 | HR 80 | Ht 64.0 in | Wt 204.0 lb

## 2014-12-22 DIAGNOSIS — J432 Centrilobular emphysema: Secondary | ICD-10-CM

## 2014-12-22 DIAGNOSIS — G4733 Obstructive sleep apnea (adult) (pediatric): Secondary | ICD-10-CM | POA: Diagnosis not present

## 2014-12-22 NOTE — Patient Instructions (Signed)
Stopped using the vapor cigarette Keep using your medications as you're doing Increase your exercise regularly, if you can do it on your own then we will do it at pulmonary rehabilitation We will see you back in 3 months or sooner if needed

## 2014-12-22 NOTE — Assessment & Plan Note (Signed)
He will go for a CPAP titration study in December.

## 2014-12-22 NOTE — Assessment & Plan Note (Addendum)
He remains symptomatic from his severe COPD.  This is complicated by the fact that he is not exercing right now.  He is profoundly deconditioned and this makes his dyspnea worse.  He feels like the dyspnea is really worsening lately. His 2015 CT scan odid not show another lung disease other than severe emphysema.  Plan: Repeat PFT (he refused) Pulmonary rehab referral (he refused) Continue current medication regimen.

## 2014-12-22 NOTE — Progress Notes (Signed)
Subjective:    Patient ID: William Dyer, male    DOB: Jul 13, 1945, 69 y.o.   MRN: [id number]  Synopsis: [facility] paitent with COPD and a history of lung cacner. 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 Follow-up    pt c/o worsening SOB with any exertion.  denies chest pain, mucus production.     William Dyer says that his breathing has gotten worse recently. He is feeling more sleepy lately and dozing off more lately.  This has been going on more lately. He has been having trouble sleeping lately. He has been going ot the bathroom a lot at night. Not sure if he snores. He has morning headaches. He is not coughing, no dyspnea.  Past Medical History  Diagnosis Date  . Hyperlipidemia   . CAD (coronary artery disease)     Remote PCI; s/p CABG x 3 in Feb 2013  . Lung cancer ([place]) 07/2000    status post right lower lobectomy for adenocarcinoma  . Heart murmur   . COPD (chronic obstructive pulmonary disease) ([place])   . Pneumonia   . Hypothyroidism   . Diabetes mellitus   . GERD (gastroesophageal reflux disease)   . Obesity       Review of Systems  Constitutional: Positive for fatigue. Negative for fever and chills.  HENT: Negative for postnasal drip, rhinorrhea and sinus pressure.   Respiratory: Positive for shortness of breath. Negative for cough and wheezing.   Cardiovascular: Negative for chest pain, palpitations and leg swelling.       Objective:   Physical Exam  Filed Vitals:   12/22/14 1121  BP: 124/68  Pulse: 80  Height: '5\' 4"'$  (1.626 m)  Weight: 204 lb (92.534 kg)  SpO2: 94%  2L O2  Gen: obese, chronically ill appearing HENT: OP clear, neck supple PULM: wheezing bases, normal air movement CV: RRR, no mgr, trace  edema GI: BS+, soft, nontender Derm: no cyanosis or rash Psyche: normal mood and affect   Recent cardiology records reviewed where he was seen and found to have stable coronary artery disease July 2015 CT chest images personally reviewed> no pulmonary embolism, there is severe emphysema in the upper lobes bilaterally, no fibrotic changes noted, postsurgical changes in the right lower lobe    Assessment & Plan:  COPD (chronic obstructive pulmonary disease) He remains symptomatic from his severe COPD.  This is complicated by the fact that he is not exercing right now.  He is profoundly deconditioned and this makes his dyspnea worse.  He feels like the dyspnea is really worsening lately. His 2015 CT scan odid not show another lung disease other than severe emphysema.  Plan: Repeat PFT (he refused) Pulmonary rehab referral (he refused) Continue current medication regimen.  OSA (obstructive sleep apnea) He will go for a CPAP titration study in December.     Current outpatient prescriptions:  .  Albuterol Sulfate (PROAIR RESPICLICK) 970 (90 BASE) MCG/ACT AEPB, Inhale 1-2 puffs into the lungs every 6 (six) hours as needed., Disp: 1 each, Rfl: 5 .  Alirocumab (PRALUENT) 75 MG/ML SOPN, Inject 75 mg into the skin every 14 (fourteen) days., Disp: 2 pen, Rfl: 6 .  arformoterol (BROVANA) 15 MCG/2ML NEBU, Take 2 mLs (15  mcg total) by nebulization 2 (two) times daily., Disp: 120 mL, Rfl: 11 .  aspirin 81 MG tablet, Take 81 mg by mouth daily., Disp: , Rfl:  .  budesonide (PULMICORT) 0.25 MG/2ML nebulizer solution, Take 2 mLs (0.25 mg total) by nebulization 2 (two) times daily., Disp: 60 mL, Rfl: 11 .  fluticasone (FLONASE) 50 MCG/ACT nasal spray, Place 1 spray into both nostrils as needed for allergies or rhinitis. 1 spray each nostril every AM, Disp: , Rfl:  .  guaiFENesin (MUCINEX) 600 MG 12 hr tablet, Take 600 mg by mouth 2 (two) times daily as needed for to loosen phlegm. For congestion, Disp: ,  Rfl:  .  ipratropium-albuterol (DUONEB) 0.5-2.5 (3) MG/3ML SOLN, Take 3 mLs by nebulization every 4 (four) hours as needed., Disp: 360 mL, Rfl: 1 .  levothyroxine (SYNTHROID, LEVOTHROID) 137 MCG tablet, Take 137 mcg by mouth daily., Disp: , Rfl:  .  lisinopril-hydrochlorothiazide (PRINZIDE,ZESTORETIC) 10-12.5 MG per tablet, Take 1 tablet by mouth daily. , Disp: , Rfl:  .  loratadine (CLARITIN) 10 MG tablet, Take 10 mg by mouth daily as needed. , Disp: , Rfl:  .  metFORMIN (GLUCOPHAGE) 1000 MG tablet, Take 1,000 mg by mouth 2 (two) times daily with a meal. , Disp: , Rfl:  .  senna-docusate (SENOKOT-S) 8.6-50 MG per tablet, Take 2 tablets by mouth 2 (two) times daily as needed for moderate constipation., Disp: 30 tablet, Rfl: 0 .  tamsulosin (FLOMAX) 0.4 MG CAPS capsule, Take 0.4 mg by mouth daily., Disp: , Rfl:

## 2015-02-03 ENCOUNTER — Telehealth: Payer: Self-pay | Admitting: Pulmonary Disease

## 2015-02-03 NOTE — Telephone Encounter (Signed)
Last seen on 12/22/14 with BQ   Patient Instructions       Stopped using the vapor cigarette Keep using your medications as you're doing Increase your exercise regularly, if you can do it on your own then we will do it at pulmonary rehabilitation We will see you back in 3 months or sooner if needed        Called and spoke with patient. He states that he has not received his Pulmicort from [facility]. I explained to the patient that the order for Brovana and Pulmicort were faxed to [facility]. I informed him that I would call [facility] and return his call once issue was discussed with [facility].  I called and spoke with Rodena Piety at [facility]. She states that they do have the order for the Brovana and Pulmicort. She is unsure why the Pulmicort was not shipped with the [facility]. She stated she will contact their pharmacy and follow up with the patient.   Called and spoke with the patient. I explained to him the patient that Rodena Piety with [facility] was looking into the reason why the Pulmicort was not refilled and that she would call him once resolved. He voiced understanding and had no further questions. Nothing further needed.

## 2015-02-05 ENCOUNTER — Ambulatory Visit (HOSPITAL_BASED_OUTPATIENT_CLINIC_OR_DEPARTMENT_OTHER): Payer: Medicare Other | Attending: Pulmonary Disease | Admitting: *Deleted

## 2015-02-05 VITALS — Ht 64.0 in | Wt 209.0 lb

## 2015-02-05 DIAGNOSIS — G4761 Periodic limb movement disorder: Secondary | ICD-10-CM | POA: Diagnosis not present

## 2015-02-05 DIAGNOSIS — G4733 Obstructive sleep apnea (adult) (pediatric): Secondary | ICD-10-CM | POA: Insufficient documentation

## 2015-02-10 ENCOUNTER — Other Ambulatory Visit: Payer: Medicare Other

## 2015-02-17 DIAGNOSIS — G4733 Obstructive sleep apnea (adult) (pediatric): Secondary | ICD-10-CM | POA: Diagnosis not present

## 2015-02-17 NOTE — Progress Notes (Signed)
Patient Name: William Dyer, William Dyer Date: 02/05/2015 Gender: Male D.O.B: 23-Jun-1945 Age (years): 65 Referring Provider: Simonne Maffucci Height (inches): 62 Interpreting Physician: Chesley Mires MD, ABSM Weight (lbs): 205 RPSGT: Gerhard Perches BMI: 35 MRN: [id number] Neck Size: 17.50  CLINICAL INFORMATION The patient is referred for a CPAP/BPAP titration study. Most recent polysomnogram dated 10/27/2014 revealed an AHI of 5.7/h and RDI of 15.1/h.  MEDICATIONS Medications taken by the patient : reviewed in electronic medical record. Medications administered by patient during sleep study : No sleep medicine administered.  SLEEP STUDY TECHNIQUE As per the AASM Manual for the Scoring of Sleep and Associated Events v2.3 (April 2016) with a hypopnea requiring 4% desaturations. The channels recorded and monitored were frontal, central and occipital EEG, electrooculogram (EOG), submentalis EMG (chin), nasal and oral airflow, thoracic and abdominal wall motion, anterior tibialis EMG, snore microphone, electrocardiogram, and pulse oximetry. Bi-level positive airway pressure (BiPAP) was initiated when the patient met split night criteria and was titrated according to treat sleep-disordered breathing.  RESPIRATORY PARAMETERS Optimal IPAP Pressure (cm): 16 Optimal EPAP Pressure (cm): 12 AHI at Optimal Pressure (/hr): 0.0 Min O2 at Optimal Pressure (%): 84.0 Sleep % at Optimal (%): 90 Supine % at Optimal (%): 100         The study was started with him using his home prescribed supplemental oxygen of 2 liters.  He was tried on CPAP first.  He had control of his obstructive sleep apnea with CPAP of 12 cm H2O, but continued to have oxygen desaturation, even with higher CPAP settings.  He was transitioned to BiPAP.  With BiPAP at 16/12 cm H2O and using 3 liters supplemental oxygen, he had reasonable control of his obstructive sleep apnea and oxygenation.  SLEEP ARCHITECTURE The study was initiated at  10:13:52 PM and terminated at 4:37:04 AM. The total recorded time was 383.2 minutes. EEG confirmed total sleep time was 282.0 minutes yielding a sleep efficiency of 73.6%. Sleep onset after lights out was 23.7 minutes with a REM latency of 34.0 minutes. The patient spent 3.55% of the night in stage N1 sleep, 66.31% in stage N2 sleep, 0.00% in stage N3 and 30.14% in REM. Wake after sleep onset (WASO) was 77.5 minutes. The Arousal Index was 3.8/hour.  LEG MOVEMENT DATA The total Periodic Limb Movements of Sleep (PLMS) were 425. The PLMS index was 90.43 .  CARDIAC DATA The 2 lead EKG demonstrated sinus rhythm. The mean heart rate was N/A beats per minute. Other EKG findings include: None.  IMPRESSIONS His obstructive sleep apnea and oxygenation were best controlled with combination of BiPAP 16/12 cm H2O and 3 liters supplemental oxygen.  he had an increase in his periodic limb movement index.  DIAGNOSIS - Obstructive Sleep Apnea. - Periodic Limb Movements of Sleep.  RECOMMENDATIONS Trial of BiPAP therapy on 16/12 cm H2O with 3 liters supplemental oxygen.  He was fitted with a medium size Resmed Full Face Mask AirFit F10 mask.  He should be assessed for presence of restless leg syndrome.   Chesley Mires, MD, [place], American Board of Sleep Medicine 02/17/2015, 12:23 PM  NPI: [id number]

## 2015-02-18 ENCOUNTER — Telehealth: Payer: Self-pay

## 2015-02-18 DIAGNOSIS — G4733 Obstructive sleep apnea (adult) (pediatric): Secondary | ICD-10-CM

## 2015-02-18 NOTE — Telephone Encounter (Signed)
A,     Dr. Halford Chessman says the following, so we need to set up a BIPAP with orders for IPAP 16, EPAP 12, 3L O2.    Thanks    B        DIAGNOSIS    - Obstructive Sleep Apnea.    - Periodic Limb Movements of Sleep.        RECOMMENDATIONS    Trial of BiPAP therapy on 16/12 cm H2O with 3 liters supplemental oxygen.        He was fitted with a medium size Resmed Full Face Mask AirFit F10 mask.        He should be assessed for presence of restless leg syndrome.    lmtcb X1 for pt to relay results/recs.  Will order bipap after speaking to pt.

## 2015-02-25 NOTE — Telephone Encounter (Signed)
Called and spoke with pt. Informed him of results and recs. Verified his DME as [facility]. Pt voiced understanding and had no further questions. Order placed and nothing further needed.

## 2015-02-27 NOTE — Progress Notes (Signed)
Patient ID: William Dyer, male   DOB: 21-Apr-1945, 70 y.o.   MRN: [id number]   This is a 70 y.o. male patient who has a history of coronary artery disease status post CABG x3 in February 2013.  PVT  SVG Diagonal SVG:  RCA LIMA: LAD   F/U myovue 03/23/12 Normal with EF 64%   Getting samples of Praluent form our pharm D  LDL down to 72 from 170  Chronic pain over right thoracotomy scar  Giving himself praluant shots  Breathing issues seeing [facility] now Using nebulizer   ROS: Denies fever, malais, weight loss, blurry vision, decreased visual acuity, cough, sputum, SOB, hemoptysis, pleuritic pain, palpitaitons, heartburn, abdominal pain, melena, lower extremity edema, claudication, or rash.  All other systems reviewed and negative  General: Affect appropriate Chronically ill obese white male  HEENT: normal Neck supple with no adenopathy JVP normal no bruits no thyromegaly Lungs Rhonchi  no wheezing and good diaphragmatic motion  Right thoracotomy scar  Heart:  S1/S2 no murmur, no rub, gallop or click PMI normal Abdomen: benighn, BS positve, no tenderness, no AAA no bruit.  No HSM or HJR Distal pulses intact with no bruits No edema Neuro non-focal Skin warm and dry No muscular weakness   Current Outpatient Prescriptions  Medication Sig Dispense Refill  . albuterol (PROVENTIL HFA;VENTOLIN HFA) 108 (90 Base) MCG/ACT inhaler Inhale 1-2 puffs into the lungs every 6 (six) hours as needed for wheezing or shortness of breath.    . Alirocumab (PRALUENT) 75 MG/ML SOPN Inject 75 mg into the skin every 14 (fourteen) days. 2 pen 6  . arformoterol (BROVANA) 15 MCG/2ML NEBU Take 2 mLs (15 mcg total) by nebulization 2 (two) times daily. 120 mL 11  . aspirin 81 MG tablet Take 81 mg by mouth daily.    . budesonide (PULMICORT) 0.25 MG/2ML nebulizer solution Take 2 mLs (0.25 mg total) by nebulization 2 (two) times daily. 60 mL 11  . fluticasone (FLONASE) 50 MCG/ACT nasal spray Place 1 spray into  both nostrils as needed for allergies or rhinitis. 1 spray each nostril every AM    . guaiFENesin (MUCINEX) 600 MG 12 hr tablet Take 600 mg by mouth 2 (two) times daily as needed for to loosen phlegm. For congestion    . ipratropium-albuterol (DUONEB) 0.5-2.5 (3) MG/3ML SOLN Take 3 mLs by nebulization every 4 (four) hours as needed (for wheezing & SOB).    Marland Kitchen levothyroxine (SYNTHROID, LEVOTHROID) 137 MCG tablet Take 137 mcg by mouth daily.    Marland Kitchen lisinopril-hydrochlorothiazide (PRINZIDE,ZESTORETIC) 10-12.5 MG per tablet Take 1 tablet by mouth daily.     Marland Kitchen loratadine (CLARITIN) 10 MG tablet Take 10 mg by mouth daily as needed for allergies or rhinitis.     . metFORMIN (GLUCOPHAGE) 1000 MG tablet Take 1,000 mg by mouth 2 (two) times daily with a meal.     . senna-docusate (SENOKOT-S) 8.6-50 MG per tablet Take 2 tablets by mouth 2 (two) times daily as needed for moderate constipation. 30 tablet 0  . tamsulosin (FLOMAX) 0.4 MG CAPS capsule Take 0.4 mg by mouth daily.     No current facility-administered medications for this visit.    Allergies  Rosuvastatin calcium; Statins; and Sulfonamide derivatives  Electrocardiogram:  ST rate 108 otherwise normal  01/21/14  09/03/14  SR rate 84  RBBB  RBBB new since previous   Assessment and Plan  CAD: chest pains are atypical and non exertional normal myovue 2014 Nitro called in if helps will  need further w/u   Chol: new 3 month supply [facility]  Concerned that it still costs him $150/month will check labs and have him f/u with [facility]  DM: Discussed low carb diet.  Target hemoglobin A1c is 6.5 or less.  Continue current medications.  COPD:  Nebulizer helps Humidity is hard for him  F/U [facility]  Thyroid    Lab Results  Component Value Date   TSH 1.380 05/13/2013    RBBB   New on ECG likely related to lung disease f/u ECG in 6 months    Jenkins Rouge

## 2015-03-02 ENCOUNTER — Encounter: Payer: Self-pay | Admitting: Cardiovascular Disease

## 2015-03-02 ENCOUNTER — Ambulatory Visit (INDEPENDENT_AMBULATORY_CARE_PROVIDER_SITE_OTHER): Payer: Medicare Other | Admitting: Cardiovascular Disease

## 2015-03-02 VITALS — BP 112/50 | HR 85 | Ht 64.0 in | Wt 201.0 lb

## 2015-03-02 DIAGNOSIS — Z79899 Other long term (current) drug therapy: Secondary | ICD-10-CM

## 2015-03-02 DIAGNOSIS — I959 Hypotension, unspecified: Secondary | ICD-10-CM | POA: Diagnosis not present

## 2015-03-02 MED ORDER — NITROGLYCERIN 0.4 MG SL SUBL: 0.4000 mg | SUBLINGUAL_TABLET | SUBLINGUAL | Status: DC | PRN

## 2015-03-02 NOTE — Patient Instructions (Addendum)
Medication Instructions:  Your physician recommends that you continue on your current medications as directed. Please refer to the Current Medication list given to you today.  Labwork: Your physician recommends that you return for lab work in: March before appointment with Elberta Leatherwood the Pharmacist. Fasting lipid and liver panel. Please do not eat or drink anything after midnight the night before.  Testing/Procedures: NONE  Follow-Up: Your physician wants you to follow-up in: April with Elberta Leatherwood the Pharmacist  Your physician wants you to follow-up in: 6 months with Dr. [name] with an EKG. You will receive a reminder letter in the mail two months in advance. If you don't receive a letter, please call our office to schedule the follow-up appointment.   If you need a refill on your cardiac medications before your next appointment, please call your pharmacy.

## 2015-03-03 ENCOUNTER — Telehealth: Payer: Self-pay | Admitting: Pulmonary Disease

## 2015-03-03 NOTE — Telephone Encounter (Signed)
Spoke with pt. States that we called him last week about being set up with BiPAP. The order was placed and sent to [facility]. We received a confirmation from [facility] that they received the order. Pt has not heard from them about this. Advised pt to call [facility]. He agreed. Nothing further was needed.

## 2015-03-25 ENCOUNTER — Other Ambulatory Visit: Payer: Self-pay | Admitting: Pharmacist

## 2015-03-25 MED ORDER — ALIROCUMAB 75 MG/ML ~~LOC~~ SOPN: 75.0000 mg | PEN_INJECTOR | SUBCUTANEOUS | Status: DC

## 2015-03-31 ENCOUNTER — Encounter: Payer: Self-pay | Admitting: Pulmonary Disease

## 2015-03-31 ENCOUNTER — Ambulatory Visit (INDEPENDENT_AMBULATORY_CARE_PROVIDER_SITE_OTHER): Payer: Medicare Other | Admitting: Pulmonary Disease

## 2015-03-31 ENCOUNTER — Other Ambulatory Visit: Payer: Self-pay | Admitting: Pulmonary Disease

## 2015-03-31 VITALS — BP 110/68 | HR 73 | Ht 64.0 in | Wt 202.4 lb

## 2015-03-31 DIAGNOSIS — R0602 Shortness of breath: Secondary | ICD-10-CM

## 2015-03-31 DIAGNOSIS — J432 Centrilobular emphysema: Secondary | ICD-10-CM | POA: Diagnosis not present

## 2015-03-31 DIAGNOSIS — G4733 Obstructive sleep apnea (adult) (pediatric): Secondary | ICD-10-CM | POA: Diagnosis not present

## 2015-03-31 NOTE — Assessment & Plan Note (Signed)
Continue VPAP 16 every 12 (bilevel) with 2 L O2. I have asked him to take his machine to [place] so that week and get a compliance report download in one month.

## 2015-03-31 NOTE — Patient Instructions (Signed)
We will arrange a lung function test and then call you with those results Keep using the CPAP machine at night Keep using her medications as you're doing I will see you back in 4 months or sooner if needed

## 2015-03-31 NOTE — Progress Notes (Signed)
Subjective:    Patient ID: William Dyer, male    DOB: Apr 19, 1945, 70 y.o.   MRN: [id number]  Synopsis: [facility] paitent with COPD and a history of lung cacner. 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 Follow-up    breathing doing well, only has problems with humidity and when overexerting himself, breathing is fine with resting.  Wearing VPAP, doing well on VPAP.      Raed got his CPAP machine at night last week but he feels like it is helping. He had a hard time at first and felt that it started to help him some in the last few nights.  Breathing has been doing pretty well.  He like the nebulizer.    He says that his breathing is as good as it has ever been.  He still gets dyspneic with activity outside.  He still has to pace himself.    He still smokes an electronic cigarette.  Past Medical History  Diagnosis Date  . Hyperlipidemia   . CAD (coronary artery disease)     Remote PCI; s/p CABG x 3 in Feb 2013  . Lung cancer ([place]) 07/2000    status post right lower lobectomy for adenocarcinoma  . Heart murmur   . COPD (chronic obstructive pulmonary disease) ([place])   . Pneumonia   . Hypothyroidism   . Diabetes mellitus   . GERD (gastroesophageal reflux disease)   . Obesity       Review of Systems  Constitutional: Positive for fatigue. Negative for fever and chills.  HENT: Negative for postnasal drip, rhinorrhea and sinus pressure.   Respiratory: Positive for shortness of breath. Negative for cough and wheezing.   Cardiovascular: Negative for chest pain, palpitations and leg swelling.       Objective:   Physical Exam  Filed Vitals:   03/31/15 1424  BP: 110/68  Pulse: 73  Height: '5\' 4"'$  (1.626 m)  Weight: 202 lb 6.4 oz (91.808 kg)  SpO2: 94%   RA today   Gen: obese, chronically ill appearing HENT: OP clear, neck supple PULM: crackles bases, normal air movement CV: RRR, no mgr, trace edema GI: BS+, soft, nontender Derm: no cyanosis or rash Psyche: normal mood and affect       Assessment & Plan:  COPD (chronic obstructive pulmonary disease) He tells me today that he was still smoking as recently as last year. Yet, he continues to complain of shortness of breath on exertion. I explained to him today that the most likely explanation for his worsening dyspnea is worsening COPD. However, he does have crackles in the bases of his lungs and so I would like for him to get a lung function test to rule out any other cause.  Plan: Staley from all tobacco products Continue Pulmicort and Brovana twice a day Continue DuoNeb as needed   OSA (obstructive sleep apnea) Continue VPAP 16 every 12 (bilevel) with 2 L O2. I have asked him to take his machine to [facility] so that week and get a compliance report download in one month.     Current outpatient prescriptions:  .  albuterol (PROVENTIL HFA;VENTOLIN HFA)  108 (90 Base) MCG/ACT inhaler, Inhale 1-2 puffs into the lungs every 6 (six) hours as needed for wheezing or shortness of breath., Disp: , Rfl:  .  Alirocumab (PRALUENT) 75 MG/ML SOPN, Inject 75 mg into the skin every 14 (fourteen) days., Disp: 2 pen, Rfl: 6 .  arformoterol (BROVANA) 15 MCG/2ML NEBU, Take 2 mLs (15 mcg total) by nebulization 2 (two) times daily., Disp: 120 mL, Rfl: 11 .  aspirin 81 MG tablet, Take 81 mg by mouth daily., Disp: , Rfl:  .  budesonide (PULMICORT) 0.25 MG/2ML nebulizer solution, Take 2 mLs (0.25 mg total) by nebulization 2 (two) times daily., Disp: 60 mL, Rfl: 11 .  fluticasone (FLONASE) 50 MCG/ACT nasal spray, Place 1 spray into both nostrils as needed for allergies or rhinitis. 1 spray each  nostril every AM, Disp: , Rfl:  .  guaiFENesin (MUCINEX) 600 MG 12 hr tablet, Take 600 mg by mouth 2 (two) times daily as needed for to loosen phlegm. For congestion, Disp: , Rfl:  .  ipratropium-albuterol (DUONEB) 0.5-2.5 (3) MG/3ML SOLN, Take 3 mLs by nebulization every 4 (four) hours as needed (for wheezing & SOB)., Disp: , Rfl:  .  levothyroxine (SYNTHROID, LEVOTHROID) 137 MCG tablet, Take 137 mcg by mouth daily., Disp: , Rfl:  .  lisinopril-hydrochlorothiazide (PRINZIDE,ZESTORETIC) 10-12.5 MG per tablet, Take 1 tablet by mouth daily. , Disp: , Rfl:  .  loratadine (CLARITIN) 10 MG tablet, Take 10 mg by mouth daily as needed for allergies or rhinitis. , Disp: , Rfl:  .  metFORMIN (GLUCOPHAGE) 1000 MG tablet, Take 1,000 mg by mouth 2 (two) times daily with a meal. , Disp: , Rfl:  .  nitroGLYCERIN (NITROSTAT) 0.4 MG SL tablet, Place 1 tablet (0.4 mg total) under the tongue every 5 (five) minutes as needed for chest pain (3 doses max)., Disp: 90 tablet, Rfl: 3 .  senna-docusate (SENOKOT-S) 8.6-50 MG per tablet, Take 2 tablets by mouth 2 (two) times daily as needed for moderate constipation., Disp: 30 tablet, Rfl: 0 .  tamsulosin (FLOMAX) 0.4 MG CAPS capsule, Take 0.4 mg by mouth 2 (two) times daily. , Disp: , Rfl:

## 2015-03-31 NOTE — Assessment & Plan Note (Signed)
He tells me today that he was still smoking as recently as last year. Yet, he continues to complain of shortness of breath on exertion. I explained to him today that the most likely explanation for his worsening dyspnea is worsening COPD. However, he does have crackles in the bases of his lungs and so I would like for him to get a lung function test to rule out any other cause.  Plan: Staley from all tobacco products Continue Pulmicort and Brovana twice a day Continue DuoNeb as needed

## 2015-04-10 ENCOUNTER — Other Ambulatory Visit (INDEPENDENT_AMBULATORY_CARE_PROVIDER_SITE_OTHER): Payer: Medicare Other | Admitting: *Deleted

## 2015-04-10 DIAGNOSIS — Z79899 Other long term (current) drug therapy: Secondary | ICD-10-CM | POA: Diagnosis not present

## 2015-04-10 LAB — LIPID PANEL
CHOLESTEROL: 191 mg/dL (ref 125–200)
HDL: 55 mg/dL (ref >=40)
LDL CALC: 99 mg/dL (ref <130)
Total CHOL/HDL Ratio: 3.5 Ratio (ref <=5.0)
Triglycerides: 186 mg/dL — ABNORMAL HIGH (ref <150)
VLDL: 37 mg/dL — AB (ref <30)

## 2015-04-10 LAB — HEPATIC FUNCTION PANEL
ALT: 13 U/L (ref 9–46)
AST: 13 U/L (ref 10–35)
Albumin: 4.2 g/dL (ref 3.6–5.1)
Alkaline Phosphatase: 42 U/L (ref 40–115)
BILIRUBIN DIRECT: 0.2 mg/dL (ref <=0.2)
Indirect Bilirubin: 0.7 mg/dL (ref 0.2–1.2)
TOTAL PROTEIN: 6.6 g/dL (ref 6.1–8.1)
Total Bilirubin: 0.9 mg/dL (ref 0.2–1.2)

## 2015-04-10 NOTE — Addendum Note (Signed)
Addended by: Eulis Foster on: 04/10/2015 11:23 AM   Modules accepted: Orders

## 2015-04-21 ENCOUNTER — Telehealth: Payer: Self-pay | Admitting: Pulmonary Disease

## 2015-04-21 NOTE — Telephone Encounter (Signed)
[facility] called back, was told that pt did not need an order for download.  Nothing further needed.

## 2015-04-21 NOTE — Telephone Encounter (Signed)
Spoke with pt, states he wants to bring his cpap in to [facility] on Friday to get a download, but was told by [facility] that an order would be placed by our office for them to obtain a download. Called [facility], was put on hold Xseveral minutes and then line was ended.  Wcb.

## 2015-04-28 ENCOUNTER — Other Ambulatory Visit: Payer: Self-pay | Admitting: Acute Care

## 2015-04-28 DIAGNOSIS — F17210 Nicotine dependence, cigarettes, uncomplicated: Secondary | ICD-10-CM

## 2015-04-30 ENCOUNTER — Ambulatory Visit (INDEPENDENT_AMBULATORY_CARE_PROVIDER_SITE_OTHER): Payer: Medicare Other | Admitting: Pulmonary Disease

## 2015-04-30 DIAGNOSIS — R0602 Shortness of breath: Secondary | ICD-10-CM | POA: Diagnosis not present

## 2015-04-30 LAB — PULMONARY FUNCTION TEST
DL/VA % pred: 83 %
DL/VA: 3.46 ml/min/mmHg/L
DLCO COR: 13.02 ml/min/mmHg
DLCO UNC % PRED: 54 %
DLCO UNC: 13.2 ml/min/mmHg
DLCO cor % pred: 53 %
FEF 25-75 PRE: 0.4 L/s
FEF 25-75 Post: 0.79 L/sec
FEF2575-%CHANGE-POST: 96 %
FEF2575-%Pred-Post: 41 %
FEF2575-%Pred-Pre: 21 %
FEV1-%Change-Post: 22 %
FEV1-%PRED-POST: 50 %
FEV1-%PRED-PRE: 41 %
FEV1-POST: 1.26 L
FEV1-Pre: 1.03 L
FEV1FVC-%CHANGE-POST: 3 %
FEV1FVC-%Pred-Pre: 76 %
FEV6-%CHANGE-POST: 19 %
FEV6-%PRED-PRE: 56 %
FEV6-%Pred-Post: 67 %
FEV6-PRE: 1.81 L
FEV6-Post: 2.16 L
FEV6FVC-%Change-Post: 0 %
FEV6FVC-%PRED-PRE: 103 %
FEV6FVC-%Pred-Post: 104 %
FVC-%Change-Post: 18 %
FVC-%PRED-PRE: 54 %
FVC-%Pred-Post: 64 %
FVC-POST: 2.2 L
FVC-Pre: 1.85 L
POST FEV6/FVC RATIO: 98 %
Post FEV1/FVC ratio: 57 %
Pre FEV1/FVC ratio: 56 %
Pre FEV6/FVC Ratio: 98 %

## 2015-04-30 NOTE — Progress Notes (Signed)
PFT performed today. 

## 2015-05-07 ENCOUNTER — Telehealth: Payer: Self-pay | Admitting: Pharmacist

## 2015-05-07 DIAGNOSIS — E782 Mixed hyperlipidemia: Secondary | ICD-10-CM

## 2015-05-07 MED ORDER — ALIROCUMAB 150 MG/ML ~~LOC~~ SOPN: 1.0000 "pen " | PEN_INJECTOR | SUBCUTANEOUS | Status: DC

## 2015-05-07 NOTE — Telephone Encounter (Signed)
PA was resubmitted for continued Praluent coverage. PA approved. Sent in refill for higher '150mg'$  dose since most recent LDL was 99 with goal <70 given history of CAD s/p CABG x3. Pt is aware of dose change. Scheduled labwork in 3 months since pt just received a supply of Praluent '75mg'$  a few days ago.

## 2015-05-11 ENCOUNTER — Ambulatory Visit: Payer: Medicare Other | Admitting: Pharmacist

## 2015-05-15 ENCOUNTER — Other Ambulatory Visit: Payer: Self-pay | Admitting: *Deleted

## 2015-05-15 NOTE — Telephone Encounter (Signed)
Per message from patient left on the refill voicemail, he requested an rx for praluent be sent to [facility] in [place].

## 2015-05-18 ENCOUNTER — Telehealth: Payer: Self-pay | Admitting: *Deleted

## 2015-05-18 NOTE — Telephone Encounter (Signed)
Discussed with patient. [employer] foundation covered his copays for a year and recently ran out. New PA had been submitted for Praluent coverage and was approved, but monthly copays will be too expensive for pt. Unfortunately do not have other options to make Praluent cheaper for him other than waiting for [employer] foundation to hopefully open up again. Advised pt that we would contact him if the [employer] foundation opens up. Will also pass his name along to the research foundation at [facility] for information on upcoming lipid trials.

## 2015-05-18 NOTE — Telephone Encounter (Signed)
Patient would like a call back at [phone] to discuss the praluent. He stated that he is not able to afford it and needs some assistance. Please advise. Thanks, MI

## 2015-05-18 NOTE — Telephone Encounter (Signed)
LMOM for patient to return call.

## 2015-06-08 ENCOUNTER — Telehealth: Payer: Self-pay | Admitting: *Deleted

## 2015-06-08 NOTE — Telephone Encounter (Signed)
Oncology Nurse Navigator Documentation  Oncology Nurse Navigator Flowsheets 06/08/2015  Navigator Encounter Type Telephone  Telephone Outgoing Call  Barriers/Navigation Needs Education  Education Smoking cessation  Interventions Education Method  Education Method Verbal  Acuity Level 2  Acuity Level 2 Educational needs  Time Spent with Patient 30   I called and spoke with patient today about his smoking cessation.  I listened as he explained his smoking and he has quit in the past.  I encouraged him to quit again.  I asked if I could send him some information on smoking cessation in the mail.  He stated that would be ok.  I will mail lit.    I asked if he was on oxygen.  He stated yes.  I asked that he not smoke near his oxygen tank.  He states he doesn't.

## 2015-06-26 ENCOUNTER — Ambulatory Visit (INDEPENDENT_AMBULATORY_CARE_PROVIDER_SITE_OTHER): Payer: Medicare Other | Admitting: Pulmonary Disease

## 2015-06-26 ENCOUNTER — Encounter: Payer: Self-pay | Admitting: Pulmonary Disease

## 2015-06-26 VITALS — BP 114/62 | HR 80

## 2015-06-26 DIAGNOSIS — G4733 Obstructive sleep apnea (adult) (pediatric): Secondary | ICD-10-CM

## 2015-06-26 DIAGNOSIS — J432 Centrilobular emphysema: Secondary | ICD-10-CM

## 2015-06-26 MED ORDER — PREDNISONE 10 MG PO TABS: ORAL_TABLET | ORAL | Status: DC

## 2015-06-26 MED ORDER — AMOXICILLIN 500 MG PO TABS: ORAL_TABLET | ORAL | Status: DC

## 2015-06-26 NOTE — Assessment & Plan Note (Signed)
100% Compliant with CPAP.  Very few leaks AHI : 0.2 Plan: Continue on CPAP at bedtime. You appear to be benefiting from the treatment Goal is to wear for at least 4-6 hours each night for maximal clinical benefit. Continue to work on weight loss, as the link between excess weight  and sleep apnea is well established.  Do not drive if sleepy. Please contact office for sooner follow up if symptoms do not improve or worsen or seek emergency care

## 2015-06-26 NOTE — Progress Notes (Signed)
History of Present Illness William Dyer is a 70 y.o. male with COPD and a history right lower lobe lobectomy for adenocarcinoma ( 2013).   5/19/2017Acute Office Visit: Pt presents to the office for an acute visit. He states he has had a cough for the last 2 days. He has been coughing up light to clear secretions. He has been using Mucinex DM, which has helped. He is requesting antibiotics.He just recently ( within 2 weeks) has completed a round of doxycycline for a skin rash.He denies chest pain, fever, orthopnea, or hemoptysis.He is not using his rescue inhaler , only uses it once a week. He is using his Brovana nebs twice daily.He states he has had a bloody nose  Lately. He is 100 % compliant with his CPAP. Using it 30 of 30 days for greater than 4 hours each night.He continues to use a vapor cigarette.  CPAP Down Load: Compliance : 30/30 nights Wearing > 4 hours every night. AHI: 0.2 events.  Compliant with  CPAP treatment ,and benefiting from treatment.    Past medical hx Past Medical History  Diagnosis Date  . Hyperlipidemia   . CAD (coronary artery disease)     Remote PCI; s/p CABG x 3 in Feb 2013  . Lung cancer ([place]) 07/2000    status post right lower lobectomy for adenocarcinoma  . Heart murmur   . COPD (chronic obstructive pulmonary disease) ([place])   . Pneumonia   . Hypothyroidism   . Diabetes mellitus   . GERD (gastroesophageal reflux disease)   . Obesity      Past surgical hx, Family hx, Social hx all reviewed.  Current Outpatient Prescriptions on File Prior to Visit  Medication Sig  . albuterol (PROVENTIL HFA;VENTOLIN HFA) 108 (90 Base) MCG/ACT inhaler Inhale 1-2 puffs into the lungs every 6 (six) hours as needed for wheezing or shortness of breath.  . Alirocumab (PRALUENT) 150 MG/ML SOPN Inject 1 pen into the skin every 14 (fourteen) days.  Marland Kitchen arformoterol (BROVANA) 15 MCG/2ML NEBU Take 2 mLs (15 mcg total) by nebulization 2 (two) times daily.  Marland Kitchen aspirin 81 MG  tablet Take 81 mg by mouth daily.  . budesonide (PULMICORT) 0.25 MG/2ML nebulizer solution Take 2 mLs (0.25 mg total) by nebulization 2 (two) times daily.  . fluticasone (FLONASE) 50 MCG/ACT nasal spray Place 1 spray into both nostrils as needed for allergies or rhinitis. 1 spray each nostril every AM  . guaiFENesin (MUCINEX) 600 MG 12 hr tablet Take 600 mg by mouth 2 (two) times daily as needed for to loosen phlegm. For congestion  . ipratropium-albuterol (DUONEB) 0.5-2.5 (3) MG/3ML SOLN Take 3 mLs by nebulization every 4 (four) hours as needed (for wheezing & SOB).  Marland Kitchen levothyroxine (SYNTHROID, LEVOTHROID) 137 MCG tablet Take 137 mcg by mouth daily.  Marland Kitchen lisinopril-hydrochlorothiazide (PRINZIDE,ZESTORETIC) 10-12.5 MG per tablet Take 1 tablet by mouth daily.   Marland Kitchen loratadine (CLARITIN) 10 MG tablet Take 10 mg by mouth daily as needed for allergies or rhinitis.   . metFORMIN (GLUCOPHAGE) 1000 MG tablet Take 1,000 mg by mouth 2 (two) times daily with a meal.   . nitroGLYCERIN (NITROSTAT) 0.4 MG SL tablet Place 1 tablet (0.4 mg total) under the tongue every 5 (five) minutes as needed for chest pain (3 doses max).  Marland Kitchen senna-docusate (SENOKOT-S) 8.6-50 MG per tablet Take 2 tablets by mouth 2 (two) times daily as needed for moderate constipation.  . tamsulosin (FLOMAX) 0.4 MG CAPS capsule Take 0.4 mg by  mouth 2 (two) times daily.    No current facility-administered medications on file prior to visit.     Allergies  Allergen Reactions  . Rosuvastatin Calcium     Failed lipitor and Zocor due to muscle aches  . Statins     Failed lipitor and Zocor due to muscle aches  . Sulfonamide Derivatives Other (See Comments)    Patient "drasws up" muscularly    Review Of Systems:  Constitutional:   No  weight loss, night sweats,  Fevers, chills, fatigue, or  lassitude.  HEENT:   No headaches,  Difficulty swallowing,  Tooth/dental problems, or  Sore throat,                No sneezing, itching, ear ache, nasal  congestion, post nasal drip,   CV:  No chest pain,  Orthopnea, PND, swelling in lower extremities, anasarca, dizziness, palpitations, syncope.   GI  No heartburn, indigestion, abdominal pain, nausea, vomiting, diarrhea, change in bowel habits, loss of appetite, bloody stools.   Resp: + shortness of breath with exertion less at rest.  + excess mucus, + productive cough,  No non-productive cough,  No coughing up of blood.  + change in color of mucus.  + wheezing.  No chest wall deformity  Skin: no rash or lesions.  GU: no dysuria, change in color of urine, no urgency or frequency.  No flank pain, no hematuria   MS:  No joint pain or swelling.  No decreased range of motion.  No back pain.  Psych:  No change in mood or affect. No depression or anxiety.  No memory loss.   Vital Signs BP 114/62 mmHg  Pulse 80  SpO2 92%   Physical Exam:  General- No distress,  A&Ox3, obese ENT: No sinus tenderness, TM clear, pale nasal mucosa, no oral exudate,no post nasal drip, no LAN Cardiac: S1, S2, regular rate and rhythm, no murmur Chest: + exp wheeze/ rales/ dullness; diminished per bases,no accessory muscle use, no nasal flaring, no sternal retractions Abd.: Soft Non-tender Ext: No clubbing cyanosis, edema Neuro:  normal strength Skin: No rashes, warm and dry Psych: normal mood and behavior   Assessment/Plan  COPD (chronic obstructive pulmonary disease) (HCC) Mild Flare: Plan: Continue your Brovana nebs as ordered. Please use nasal saline mist for your nasal dryness. This can be purchased over the counter. Continue using the Mucinex as you have been doing. Prednisone taper; 10 mg tablets:  20 mg x 5 days then stop. Watch your blood sugars while on prednisone. Amoxicillin 500 mg twice daily for 7 days. Follow up with Dr, Lake Bells in 6 months Please contact office for sooner follow up if symptoms do not improve or worsen or seek emergency care    OSA (obstructive sleep apnea) 100%  Compliant with CPAP.  Very few leaks AHI : 0.2 Plan: Continue on CPAP at bedtime. You appear to be benefiting from the treatment Goal is to wear for at least 4-6 hours each night for maximal clinical benefit. Continue to work on weight loss, as the link between excess weight  and sleep apnea is well established.  Do not drive if sleepy. Please contact office for sooner follow up if symptoms do not improve or worsen or seek emergency care      Magdalen Spatz, NP 06/26/2015  5:23 PM   Attending:  I have seen and examined the patient with nurse practitioner/resident and agree with the note above.  We formulated the plan together and I  elicited the following history.    Reuel has been very compliant with his CPAP therapy as noted above. Unfortunately he's been coughing up more mucus and wheezing for the last 2 days. He just completed a course of doxycycline a few weeks ago.  On exam: Wheezing in the bases bilaterally, no crackles He is well-appearing but obese Acute exacerbation of COPD: Treat with prednisone and amoxicillin, continue inhaled therapy Obstructive sleep apnea good compliance, he was counseled on weight loss today  Greater than 50% of this visit was spent face-to-face, 26 minute visit  Roselie Awkward, MD [facility] Pager: [phone] Cell: [phone] After 3pm or if no response, call [phone]

## 2015-06-26 NOTE — Assessment & Plan Note (Addendum)
Mild Flare: Plan: Continue your Brovana nebs as ordered. Please use nasal saline mist for your nasal dryness. This can be purchased over the counter. Continue using the Mucinex as you have been doing. Prednisone taper; 10 mg tablets:  20 mg x 5 days then stop. Watch your blood sugars while on prednisone. Amoxicillin 500 mg twice daily for 7 days. Follow up with Dr, Lake Bells in 6 months Please contact office for sooner follow up if symptoms do not improve or worsen or seek emergency care

## 2015-06-26 NOTE — Patient Instructions (Addendum)
It is nice to meet you today. Continue your Brovana nebs as ordered. Please use nasal saline mist for your nasal dryness. This can be purchased over the counter. Continue using the Mucinex as you have been doing. Prednisone taper; 10 mg tablets:  20 mg x 5 days then stop. Watch your blood sugars while on prednisone. Amoxicillin 500 mg twice daily for 7 days. Follow up with Dr, Lake Bells in 6 months Continue on CPAP at bedtime. You appear to be benefiting from the treatment Goal is to wear for at least 4-6 hours each night for maximal clinical benefit. Continue to work on weight loss, as the link between excess weight  and sleep apnea is well established.  Do not drive if sleepy. Please contact office for sooner follow up if symptoms do not improve or worsen or seek emergency care

## 2015-07-30 ENCOUNTER — Ambulatory Visit (INDEPENDENT_AMBULATORY_CARE_PROVIDER_SITE_OTHER): Payer: Medicare Other | Admitting: Pulmonary Disease

## 2015-07-30 ENCOUNTER — Encounter: Payer: Self-pay | Admitting: Pulmonary Disease

## 2015-07-30 VITALS — BP 136/64 | HR 97 | Ht 64.0 in | Wt 201.0 lb

## 2015-07-30 DIAGNOSIS — J432 Centrilobular emphysema: Secondary | ICD-10-CM

## 2015-07-30 DIAGNOSIS — Z720 Tobacco use: Secondary | ICD-10-CM | POA: Diagnosis not present

## 2015-07-30 NOTE — Progress Notes (Signed)
Subjective:    Patient ID: William Dyer, male    DOB: Mar 05, 1945, 70 y.o.   MRN: [id number]  Synopsis: [facility] paitent with COPD and a history of lung cacner. RLL Lobectomy 2001 at [facility] > adenocarinoma PFT's 2009:  FEV1 1.73 (63%), ratio 55, DLCO 50% Urinary issues with spiriva, can't afford tudorza Trial of spiriva respimat 08/2013 >> still with urinary retention issues 2017 started BIPAP 16/12 with 2 L O2  Still smokes vape daily as of June 2017 March 2017 pulmonary function testing: Ratio 57%, FEV1 1.26 L (50% predicted, 22% change with bronchodilator which is greater than 200 mL), FVC 2.20 L (64% predicted), DLCO 13.2 (54% predicted).   HPI Chief Complaint  Patient presents with  . Follow-up    pt c/o sob with exertion, difficulty breathing during hot/humid weather.     William Dyer is doing better than when we saw him last time. He took the prednisone and the augmentin. He is taking the pulmicort and budesonide regularly. He has been doing OK with the exception of some trouble with the humidity that's out right now. He is upset because his brother just died.  Past Medical History  Diagnosis Date  . Hyperlipidemia   . CAD (coronary artery disease)     Remote PCI; s/p CABG x 3 in Feb 2013  . Lung cancer ([place]) 07/2000    status post right lower lobectomy for adenocarcinoma  . Heart murmur   . COPD (chronic obstructive pulmonary disease) ([place])   . Pneumonia   . Hypothyroidism   . Diabetes mellitus   . GERD (gastroesophageal reflux disease)   . Obesity       Review of Systems  Constitutional: Negative for fever, chills and fatigue.  HENT: Negative for postnasal drip, rhinorrhea and sinus pressure.   Respiratory: Negative for cough, shortness of breath and wheezing.   Cardiovascular: Negative for chest pain, palpitations and leg swelling.       Objective:   Physical Exam  Filed Vitals:   07/30/15 1458  BP: 136/64  Pulse: 97  Height: '5\' 4"'$  (1.626 m)  Weight: 201  lb (91.173 kg)  SpO2: 92%   RA   Gen: obese, chronically ill appearing HENT: OP clear, neck supple PULM: crackles bases, normal air movement CV: RRR, no mgr, trace edema GI: BS+, soft, nontender Derm: no cyanosis or rash Psyche: normal mood and affect       Assessment & Plan:  COPD (chronic obstructive pulmonary disease) (HCC) This has been a stable interval since the last exacerbation.  He is intolerant of of long anti-muscarinics.  Given his recurrent exacerbation he would benefit from roflumilast.  He started smoking again when his brother died.  Plan: Continue pulmicort and brovana Add roflumilast Quit smoking  Tobacco abuse Counseled at length to quit smoking again today  He will have a lung cancer screening CT later this year     Current outpatient prescriptions:  .  albuterol (PROVENTIL HFA;VENTOLIN HFA) 108 (90 Base) MCG/ACT inhaler, Inhale 1-2 puffs into the lungs every 6 (six) hours as needed for wheezing or shortness of breath., Disp: , Rfl:  .  arformoterol (BROVANA) 15 MCG/2ML NEBU, Take 2 mLs (15 mcg total) by nebulization 2 (two) times daily., Disp: 120 mL, Rfl: 11 .  aspirin 81 MG tablet, Take 81 mg by mouth daily., Disp: , Rfl:  .  budesonide (PULMICORT) 0.25 MG/2ML nebulizer solution, Take 2 mLs (0.25 mg total) by nebulization 2 (two) times daily., Disp:  60 mL, Rfl: 11 .  fluticasone (FLONASE) 50 MCG/ACT nasal spray, Place 1 spray into both nostrils as needed for allergies or rhinitis. 1 spray each nostril every AM, Disp: , Rfl:  .  guaiFENesin (MUCINEX) 600 MG 12 hr tablet, Take 600 mg by mouth 2 (two) times daily as needed for to loosen phlegm. For congestion, Disp: , Rfl:  .  ipratropium-albuterol (DUONEB) 0.5-2.5 (3) MG/3ML SOLN, Take 3 mLs by nebulization every 4 (four) hours as needed (for wheezing & SOB)., Disp: , Rfl:  .  levothyroxine (SYNTHROID, LEVOTHROID) 137 MCG tablet, Take 137 mcg by mouth daily., Disp: , Rfl:  .   lisinopril-hydrochlorothiazide (PRINZIDE,ZESTORETIC) 10-12.5 MG per tablet, Take 1 tablet by mouth daily. , Disp: , Rfl:  .  loratadine (CLARITIN) 10 MG tablet, Take 10 mg by mouth daily as needed for allergies or rhinitis. , Disp: , Rfl:  .  metFORMIN (GLUCOPHAGE) 1000 MG tablet, Take 1,000 mg by mouth 2 (two) times daily with a meal. , Disp: , Rfl:  .  nitroGLYCERIN (NITROSTAT) 0.4 MG SL tablet, Place 1 tablet (0.4 mg total) under the tongue every 5 (five) minutes as needed for chest pain (3 doses max)., Disp: 90 tablet, Rfl: 3 .  senna-docusate (SENOKOT-S) 8.6-50 MG per tablet, Take 2 tablets by mouth 2 (two) times daily as needed for moderate constipation., Disp: 30 tablet, Rfl: 0 .  tamsulosin (FLOMAX) 0.4 MG CAPS capsule, Take 0.4 mg by mouth 2 (two) times daily. , Disp: , Rfl:

## 2015-07-30 NOTE — Assessment & Plan Note (Signed)
This has been a stable interval since the last exacerbation.  He is intolerant of of long anti-muscarinics.  Given his recurrent exacerbation he would benefit from roflumilast.  He started smoking again when his brother died.  Plan: Continue pulmicort and brovana Add roflumilast Quit smoking

## 2015-07-30 NOTE — Patient Instructions (Signed)
Stop smoking Keep taking Pulmicort and Brovana Take 1 pill of the Roflumilast daily, watch out for upset stomach. Call me if you are handling the medicine okay and then I will send in a prescription We will see you back in 3 months or sooner if needed

## 2015-07-30 NOTE — Assessment & Plan Note (Signed)
Counseled at length to quit smoking again today  He will have a lung cancer screening CT later this year

## 2015-08-07 ENCOUNTER — Other Ambulatory Visit (INDEPENDENT_AMBULATORY_CARE_PROVIDER_SITE_OTHER): Payer: Medicare Other | Admitting: *Deleted

## 2015-08-07 DIAGNOSIS — E782 Mixed hyperlipidemia: Secondary | ICD-10-CM

## 2015-08-07 LAB — HEPATIC FUNCTION PANEL
ALK PHOS: 42 U/L (ref 40–115)
ALT: 12 U/L (ref 9–46)
AST: 14 U/L (ref 10–35)
Albumin: 4.3 g/dL (ref 3.6–5.1)
BILIRUBIN DIRECT: 0.2 mg/dL (ref <=0.2)
BILIRUBIN INDIRECT: 0.9 mg/dL (ref 0.2–1.2)
TOTAL PROTEIN: 6.7 g/dL (ref 6.1–8.1)
Total Bilirubin: 1.1 mg/dL (ref 0.2–1.2)

## 2015-08-07 LAB — LIPID PANEL
CHOLESTEROL: 204 mg/dL — AB (ref 125–200)
HDL: 60 mg/dL (ref >=40)
LDL Cholesterol: 107 mg/dL (ref <130)
TRIGLYCERIDES: 183 mg/dL — AB (ref <150)
Total CHOL/HDL Ratio: 3.4 Ratio (ref <=5.0)
VLDL: 37 mg/dL — AB (ref <30)

## 2015-08-13 ENCOUNTER — Telehealth: Payer: Self-pay | Admitting: Pulmonary Disease

## 2015-08-13 NOTE — Telephone Encounter (Signed)
I would recommend stopping for a week then restarting every other day to see if that is better, please offer more samples from my office.  Thanks

## 2015-08-13 NOTE — Telephone Encounter (Signed)
Pt aware of rec's per BQ.  Samples left up front for pt to pick up.  Nothing further needed.

## 2015-08-13 NOTE — Telephone Encounter (Signed)
Spoke with pt. States that Daliresp is causing stomach pain and diarrhea. Symptoms started a few days ago, has 2 days left of samples. Has had 3-4 bowel movements a day. He has also had some slight weight loss. Would like BQ's recommendations on whether or not to continue this medication.  BQ - please advise. Thanks.

## 2015-08-14 ENCOUNTER — Ambulatory Visit (INDEPENDENT_AMBULATORY_CARE_PROVIDER_SITE_OTHER)
Admission: RE | Admit: 2015-08-14 | Discharge: 2015-08-14 | Disposition: A | Discharge status: Home / Self Care | Payer: Medicare Other | Source: Ambulatory Visit | Attending: Acute Care | Admitting: Acute Care

## 2015-08-14 DIAGNOSIS — F17210 Nicotine dependence, cigarettes, uncomplicated: Secondary | ICD-10-CM | POA: Diagnosis not present

## 2015-08-19 ENCOUNTER — Telehealth: Payer: Self-pay | Admitting: Pulmonary Disease

## 2015-08-19 NOTE — Telephone Encounter (Signed)
Pt had CT low dose CT. Pt is requesting these results. Please advise Judson Roch thanks

## 2015-08-19 NOTE — Telephone Encounter (Signed)
Called spoke with pt and read results below. He is requesting a personal call from Judson Roch as he wants further explanation in the results. Please advise Judson Roch thanks

## 2015-08-19 NOTE — Telephone Encounter (Signed)
Please let William Dyer know his CT scan was read as a Lung RADs 2. Lung RADS 2: nodules that are benign in appearance and behavior with a very low likelihood of becoming a clinically active cancer due to size or lack of growth. Recommendation per radiology is for a repeat LDCT in 12 months.We will schedule his follow up scan in  1 year. Thanks so much.

## 2015-09-22 ENCOUNTER — Ambulatory Visit: Payer: Medicare Other | Admitting: Cardiovascular Disease

## 2015-10-29 ENCOUNTER — Encounter: Payer: Self-pay | Admitting: *Deleted

## 2015-10-29 ENCOUNTER — Encounter: Payer: Self-pay | Admitting: Cardiovascular Disease

## 2015-10-29 DIAGNOSIS — Z006 Encounter for examination for normal comparison and control in clinical research program: Secondary | ICD-10-CM

## 2015-10-29 NOTE — Progress Notes (Signed)
I met with patient for Clear Study screening visit. This study is for patients that are statin intolerant.   All elements of the informed consent form,study requirements and expectations were reviewed with the subject. All questions and concerns were identified and addressed prior to the signing of the consent. No procedures were performed prior to consenting the subject. The subject was given an adequate amount of time to make an informed decision. A copy of the consent was provided to the patient to take home.  Vital signs were done and stable. Labs drawn for screening.Marland Kitchen

## 2015-11-02 ENCOUNTER — Encounter: Payer: Self-pay | Admitting: Pulmonary Disease

## 2015-11-02 ENCOUNTER — Ambulatory Visit (INDEPENDENT_AMBULATORY_CARE_PROVIDER_SITE_OTHER): Payer: Medicare Other | Admitting: Pulmonary Disease

## 2015-11-02 ENCOUNTER — Other Ambulatory Visit (INDEPENDENT_AMBULATORY_CARE_PROVIDER_SITE_OTHER): Payer: Medicare Other

## 2015-11-02 VITALS — BP 120/62 | HR 68 | Ht 64.0 in | Wt 193.4 lb

## 2015-11-02 DIAGNOSIS — J432 Centrilobular emphysema: Secondary | ICD-10-CM

## 2015-11-02 DIAGNOSIS — C3401 Malignant neoplasm of right main bronchus: Secondary | ICD-10-CM

## 2015-11-02 DIAGNOSIS — R634 Abnormal weight loss: Secondary | ICD-10-CM | POA: Diagnosis not present

## 2015-11-02 DIAGNOSIS — Z720 Tobacco use: Secondary | ICD-10-CM

## 2015-11-02 DIAGNOSIS — G4733 Obstructive sleep apnea (adult) (pediatric): Secondary | ICD-10-CM | POA: Diagnosis not present

## 2015-11-02 LAB — HEPATIC FUNCTION PANEL
ALBUMIN: 4.3 g/dL (ref 3.5–5.2)
ALK PHOS: 48 U/L (ref 39–117)
ALT: 12 U/L (ref 0–53)
AST: 13 U/L (ref 0–37)
BILIRUBIN DIRECT: 0.1 mg/dL (ref 0.0–0.3)
TOTAL PROTEIN: 7.7 g/dL (ref 6.0–8.3)
Total Bilirubin: 1.1 mg/dL (ref 0.2–1.2)

## 2015-11-02 NOTE — Assessment & Plan Note (Signed)
He still uses the vape more than cigarettes, but he is still smoking about a pack per month.    Counseled at length to quit

## 2015-11-02 NOTE — Assessment & Plan Note (Signed)
Lung cancer screening CT this year showed no worrisome nodules

## 2015-11-02 NOTE — Assessment & Plan Note (Signed)
This has been a stable interval for him. He wonders whether or not his inhaled medicines are contributing to his lack of taste. I told him I thought it's possible, but I worry about the Lamisil. See below.  Plan: Continue Brovana and Pulmicort for now Quit smoking Stay active

## 2015-11-02 NOTE — Assessment & Plan Note (Signed)
He needs a new mask because his current mask is leaking and keeping him awake.  Plan: Send order to [facility] for new mask

## 2015-11-02 NOTE — Patient Instructions (Signed)
We will send you for a mask fit for your CPAP machine We'll call you with the results of today's blood work Quit smoking Keep taking her medicines as you're doing I recommend that she consider seeing anyone of the following gastroenterologists: Armbruster, Nandigam, Pyrtle  F/u 4-6 months

## 2015-11-02 NOTE — Progress Notes (Signed)
Subjective:    Patient ID: William Dyer, male    DOB: 1946/01/25, 70 y.o.   MRN: [id number]  Synopsis: [facility] paitent with COPD and a history of lung cacner. RLL Lobectomy 2001 at [facility] > adenocarinoma PFT's 2009:  FEV1 1.73 (63%), ratio 55, DLCO 50% Urinary issues with spiriva, can't afford tudorza Trial of spiriva respimat 08/2013 >> still with urinary retention issues 2017 started BIPAP 16/12 with 2 L O2  Still smokes vape daily as of June 2017 March 2017 pulmonary function testing: Ratio 57%, FEV1 1.26 L (50% predicted, 22% change with bronchodilator which is greater than 200 mL), FVC 2.20 L (64% predicted), DLCO 13.2 (54% predicted).   HPI Chief Complaint  Patient presents with  . Follow-up    Pt reports has good/bad days depending on weather. Pt reports he has prod cough (clear phlem).    He has lost weight recently because he has lost his appetite.  He wonders if it is related to his inhaled medicines or some lamisil he has been taking.  He has been taking his budesonide and arformoterol.  His breathing has been good, no flares since the last visit. He says he is not sleeping well when he is on the CPAP mask. This has been a persistent problem.  The mask bothers his sleep.  He says there is a leak with the mask that will wake him up.  Past Medical History:  Diagnosis Date  . CAD (coronary artery disease)    Remote PCI; s/p CABG x 3 in Feb 2013  . COPD (chronic obstructive pulmonary disease) ([place])   . Diabetes mellitus   . GERD (gastroesophageal reflux disease)   . Heart murmur   . Hyperlipidemia   . Hypothyroidism   . Lung cancer ([place]) 07/2000   status post right lower lobectomy for adenocarcinoma  . Obesity   . Pneumonia       Review of Systems  Constitutional: Negative for chills, fatigue and fever.  HENT: Negative for postnasal drip, rhinorrhea and sinus pressure.   Respiratory: Negative for cough, shortness of breath and wheezing.   Cardiovascular:  Negative for chest pain, palpitations and leg swelling.       Objective:   Physical Exam  Vitals:   11/02/15 1353  BP: 120/62  Pulse: 68  SpO2: 94%  Weight: 193 lb 6.4 oz (87.7 kg)  Height: '5\' 4"'$  (1.626 m)   RA   Gen: obese, chronically ill appearing HENT: OP clear, neck supple PULM: Crackles left base, normal air movement CV: RRR, no mgr, trace edema GI: BS+, soft, nontender Derm: no cyanosis or rash Psyche: normal mood and affect       Assessment & Plan:  OSA (obstructive sleep apnea) He needs a new mask because his current mask is leaking and keeping him awake.  Plan: Send order to [facility] for new mask  Tobacco abuse He still uses the vape more than cigarettes, but he is still smoking about a pack per month.    Counseled at length to quit  Lung cancer Lung cancer screening CT this year showed no worrisome nodules  COPD (chronic obstructive pulmonary disease) ([place]) This has been a stable interval for him. He wonders whether or not his inhaled medicines are contributing to his lack of taste. I told him I thought it's possible, but I worry about the Lamisil. See below.  Plan: Continue Brovana and Pulmicort for now Quit smoking Stay active  Loss of weight He has lost  a significant amount of weight this year which he attributes to lack of taste. This years lung cancer screening CT did not show evidence of lung cancer. I agree with him that the Lamisil may be contributing but he also notes periodic abdominal pain. He like a red recommendation for a gastroenterologist.  Plan: Check LFT considering Lamisil use I gave him the names of our providers with [facility]  > 50% of today's 28 minute visit face to face   Current Outpatient Prescriptions:  .  albuterol (PROVENTIL HFA;VENTOLIN HFA) 108 (90 Base) MCG/ACT inhaler, Inhale 1-2 puffs into the lungs every 6 (six) hours as needed for wheezing or shortness of breath., Disp: , Rfl:  .  arformoterol  (BROVANA) 15 MCG/2ML NEBU, Take 2 mLs (15 mcg total) by nebulization 2 (two) times daily., Disp: 120 mL, Rfl: 11 .  aspirin 81 MG tablet, Take 81 mg by mouth daily., Disp: , Rfl:  .  budesonide (PULMICORT) 0.25 MG/2ML nebulizer solution, Take 2 mLs (0.25 mg total) by nebulization 2 (two) times daily., Disp: 60 mL, Rfl: 11 .  fluticasone (FLONASE) 50 MCG/ACT nasal spray, Place 1 spray into both nostrils as needed for allergies or rhinitis. 1 spray each nostril every AM, Disp: , Rfl:  .  guaiFENesin (MUCINEX) 600 MG 12 hr tablet, Take 600 mg by mouth 2 (two) times daily as needed for to loosen phlegm. For congestion, Disp: , Rfl:  .  levothyroxine (SYNTHROID, LEVOTHROID) 137 MCG tablet, Take 137 mcg by mouth daily., Disp: , Rfl:  .  lisinopril-hydrochlorothiazide (PRINZIDE,ZESTORETIC) 10-12.5 MG per tablet, Take 1 tablet by mouth daily. , Disp: , Rfl:  .  loratadine (CLARITIN) 10 MG tablet, Take 10 mg by mouth daily as needed for allergies or rhinitis. , Disp: , Rfl:  .  metFORMIN (GLUCOPHAGE) 1000 MG tablet, Take 1,000 mg by mouth 2 (two) times daily with a meal. , Disp: , Rfl:  .  nitroGLYCERIN (NITROSTAT) 0.4 MG SL tablet, Place 1 tablet (0.4 mg total) under the tongue every 5 (five) minutes as needed for chest pain (3 doses max)., Disp: 90 tablet, Rfl: 3 .  senna-docusate (SENOKOT-S) 8.6-50 MG per tablet, Take 2 tablets by mouth 2 (two) times daily as needed for moderate constipation., Disp: 30 tablet, Rfl: 0 .  tamsulosin (FLOMAX) 0.4 MG CAPS capsule, Take 0.4 mg by mouth 2 (two) times daily. , Disp: , Rfl:  .  ipratropium-albuterol (DUONEB) 0.5-2.5 (3) MG/3ML SOLN, Take 3 mLs by nebulization every 4 (four) hours as needed (for wheezing & SOB)., Disp: , Rfl:

## 2015-11-02 NOTE — Assessment & Plan Note (Signed)
He has lost a significant amount of weight this year which he attributes to lack of taste. This years lung cancer screening CT did not show evidence of lung cancer. I agree with him that the Lamisil may be contributing but he also notes periodic abdominal pain. He like a red recommendation for a gastroenterologist.  Plan: Check LFT considering Lamisil use I gave him the names of our providers with [facility] gastro[facility]

## 2015-11-03 NOTE — Progress Notes (Signed)
lmtcb

## 2015-11-04 ENCOUNTER — Telehealth: Payer: Self-pay | Admitting: Pulmonary Disease

## 2015-11-04 NOTE — Telephone Encounter (Signed)
William Doom, MD  Len Blalock, CMA        A,  Please let the patient know this was OK  Thanks,  B     I spoke with patient about results and he verbalized understanding and had no questions

## 2015-11-05 ENCOUNTER — Encounter: Payer: Self-pay | Admitting: Cardiovascular Disease

## 2015-11-09 ENCOUNTER — Ambulatory Visit (HOSPITAL_BASED_OUTPATIENT_CLINIC_OR_DEPARTMENT_OTHER): Payer: Medicare Other | Attending: Pulmonary Disease | Admitting: Radiology

## 2015-11-09 DIAGNOSIS — G4733 Obstructive sleep apnea (adult) (pediatric): Secondary | ICD-10-CM

## 2015-11-13 ENCOUNTER — Encounter: Payer: Self-pay | Admitting: *Deleted

## 2015-12-10 ENCOUNTER — Encounter: Payer: Self-pay | Admitting: *Deleted

## 2015-12-10 DIAGNOSIS — Z006 Encounter for examination for normal comparison and control in clinical research program: Secondary | ICD-10-CM

## 2015-12-10 NOTE — Progress Notes (Signed)
Subject came to Research clinic for T1-W0 visit for the CLEAR research study.  No complaints. Vital signs taken and subject issued new bottle of study medication.

## 2015-12-17 NOTE — Progress Notes (Signed)
Patient ID: William Dyer, male   DOB: [birth year], 70 y.o.   MRN: [id number]   This is a 70 y.o. male patient who has a history of coronary artery disease status post CABG x3 in February 2013.  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  All other systems reviewed and negative  General: Affect appropriate Chronically ill obese white male  HEENT: normal Neck supple with no adenopathy JVP normal no bruits no thyromegaly Lungs Rhonchi  no wheezing and good diaphragmatic motion  Right thoracotomy scar  Heart:  S1/S2 no murmur, no rub, gallop or click PMI normal Abdomen: benighn, BS positve, no tenderness, no AAA no bruit.  No HSM or HJR Distal pulses intact with no bruits No edema Neuro non-focal Skin warm and dry No muscular weakness   Current Outpatient Prescriptions  Medication Sig Dispense Refill  . albuterol (PROVENTIL HFA;VENTOLIN HFA) 108 (90 Base) MCG/ACT inhaler Inhale 1-2 puffs into the lungs every 6 (six) hours as needed for wheezing or shortness of breath.    Marland Kitchen arformoterol (BROVANA) 15 MCG/2ML NEBU Take 2 mLs (15 mcg total) by nebulization 2 (two) times daily. 120 mL 11  . aspirin 81 MG tablet Take 81 mg by mouth daily.    . budesonide (PULMICORT) 0.25 MG/2ML nebulizer solution Take 2 mLs (0.25 mg total) by nebulization 2 (two) times daily. 60 mL 11  . fluticasone (FLONASE) 50 MCG/ACT nasal spray Place 1 spray into both nostrils as needed for allergies or rhinitis. 1 spray each nostril every AM    .  guaiFENesin (MUCINEX) 600 MG 12 hr tablet Take 600 mg by mouth 2 (two) times daily as needed for to loosen phlegm. For congestion    . ipratropium-albuterol (DUONEB) 0.5-2.5 (3) MG/3ML SOLN Take 3 mLs by nebulization every 4 (four) hours as needed (for wheezing & SOB).    Marland Kitchen levothyroxine (SYNTHROID, LEVOTHROID) 137 MCG tablet Take 137 mcg by mouth daily.    Marland Kitchen lisinopril-hydrochlorothiazide (PRINZIDE,ZESTORETIC) 10-12.5 MG per tablet Take 1 tablet by mouth daily.     Marland Kitchen loratadine (CLARITIN) 10 MG tablet Take 10 mg by mouth daily as needed for allergies or rhinitis.     . metFORMIN (GLUCOPHAGE) 1000 MG tablet Take 1,000 mg by mouth 2 (two) times daily with a meal.     . nitroGLYCERIN (NITROSTAT) 0.4 MG SL tablet Place 1 tablet (0.4 mg total) under the tongue every 5 (five) minutes as needed for chest pain (3 doses max). 25 tablet 3  . senna-docusate (SENOKOT-S) 8.6-50 MG per tablet Take 2 tablets by mouth 2 (two) times daily as needed for moderate constipation. 30 tablet 0  . tamsulosin (FLOMAX) 0.4 MG CAPS capsule Take 0.4 mg by mouth daily after supper.      No current facility-administered medications for this visit.     Allergies  Rosuvastatin calcium; Statins; and Sulfonamide derivatives  Electrocardiogram:  ST rate 108 otherwise normal  01/21/14  09/03/14  SR rate 84  RBBB  RBBB new since previous   Assessment and Plan  CAD: chest  pains are atypical and non exertional normal myovue 2014 Nitro called in if helps will need further w/u   Chol: Praluant too expensive in CLEAR research trial now LDL 107 f/u [place]  DM: Discussed low carb diet.  Target hemoglobin A1c is 6.5 or less.  Continue current medications.  COPD:  Nebulizer helps Humidity is hard for him  F/U [facility] CT ok no nodules/ cancer  Thyroid    Lab Results  Component Value Date   TSH 1.380 05/13/2013    RBBB  2016 New on ECG likely related to lung disease f/u ECG in 6 months  12/21/15  SR rate 76 RBBB no change     Jenkins Rouge

## 2015-12-21 ENCOUNTER — Encounter (INDEPENDENT_AMBULATORY_CARE_PROVIDER_SITE_OTHER): Payer: Self-pay

## 2015-12-21 ENCOUNTER — Encounter: Payer: Self-pay | Admitting: Cardiovascular Disease

## 2015-12-21 ENCOUNTER — Ambulatory Visit (INDEPENDENT_AMBULATORY_CARE_PROVIDER_SITE_OTHER): Payer: Medicare Other | Admitting: Cardiovascular Disease

## 2015-12-21 VITALS — BP 110/60 | HR 70 | Ht 64.0 in | Wt 187.6 lb

## 2015-12-21 DIAGNOSIS — I2510 Atherosclerotic heart disease of native coronary artery without angina pectoris: Secondary | ICD-10-CM | POA: Diagnosis not present

## 2015-12-21 DIAGNOSIS — Z79899 Other long term (current) drug therapy: Secondary | ICD-10-CM

## 2015-12-21 DIAGNOSIS — I2583 Coronary atherosclerosis due to lipid rich plaque: Secondary | ICD-10-CM

## 2015-12-21 MED ORDER — NITROGLYCERIN 0.4 MG SL SUBL: 0.4000 mg | SUBLINGUAL_TABLET | SUBLINGUAL | 3 refills | Status: DC | PRN

## 2015-12-21 NOTE — Patient Instructions (Addendum)

## 2015-12-22 ENCOUNTER — Other Ambulatory Visit: Payer: Self-pay | Admitting: Acute Care

## 2015-12-22 DIAGNOSIS — F17210 Nicotine dependence, cigarettes, uncomplicated: Secondary | ICD-10-CM

## 2016-03-16 ENCOUNTER — Telehealth: Payer: Self-pay | Admitting: Cardiovascular Disease

## 2016-03-16 DIAGNOSIS — E875 Hyperkalemia: Secondary | ICD-10-CM

## 2016-03-16 NOTE — Telephone Encounter (Signed)
New Message    Please call regarding the pt potassium levels

## 2016-03-17 ENCOUNTER — Encounter: Payer: Self-pay | Admitting: *Deleted

## 2016-03-17 DIAGNOSIS — Z006 Encounter for examination for normal comparison and control in clinical research program: Secondary | ICD-10-CM

## 2016-03-17 NOTE — Progress Notes (Signed)
CLEAR Research study labs received 03/14/2016 showed an elevated Potassium level, normal 3.3-5.1, subject level 5.6.  On 03/15/2016 Potassium level repeated and sent to [facility].  Results were 5.4, normal 3.5-5.2.  Dr. Lia Foyer noted labs as borderline elevation of K+, question secondary to ACE.  Call Dr. Kyla Balzarine nurse and let patient know.  I have spoken with the patient who is drinking plenty of water and cutting back on fresh fruits especially bananas.  Spoke with Tillie Rung in reception who took message regarding K+ level and will route to the RN per RN request this morning.

## 2016-03-17 NOTE — Telephone Encounter (Signed)
new message   Camila Li verbalized that he is calling for the rn to return his call  he is from the research dept at [facility]   Pt [facility] potassium is 5.4 Dr.Stucky said normal range 5.2 and Dr.Nishan needs to be informed

## 2016-03-17 NOTE — Telephone Encounter (Signed)
Patient started a Study called CLEAR September 2017. In the study they are testing a new medication Bemtedoic Acid for cholesterol. Patient started Medication or Placebo October 2017.  At the beginning of study patient's potassium was 4.7 (September) 4.3 (October) and 5.6 (February). After high level, patient was retested and potassium is still high at 5.4.  Per Dr. [name], will check BMET in 4 weeks if potassium is still up will decrease lisinopril to 5 mg.    Called patient to schedule lab work. Left message for patient to call back.

## 2016-03-17 NOTE — Telephone Encounter (Signed)
Patient has appointment for BMET on 04/11/16.

## 2016-03-17 NOTE — Telephone Encounter (Signed)
Left message for William Dyer to call back. Will need more details Per Dr. [name].

## 2016-04-04 ENCOUNTER — Ambulatory Visit: Payer: Medicare Other | Admitting: Pulmonary Disease

## 2016-04-11 ENCOUNTER — Other Ambulatory Visit: Payer: Medicare Other | Admitting: *Deleted

## 2016-04-11 DIAGNOSIS — E875 Hyperkalemia: Secondary | ICD-10-CM

## 2016-04-11 LAB — BASIC METABOLIC PANEL
BUN / CREAT RATIO: 21 (ref 10–24)
BUN: 20 mg/dL (ref 8–27)
CO2: 22 mmol/L (ref 18–29)
Calcium: 9.4 mg/dL (ref 8.6–10.2)
Chloride: 93 mmol/L — ABNORMAL LOW (ref 96–106)
Creatinine, Ser: 0.94 mg/dL (ref 0.76–1.27)
GFR calc Af Amer: 94 mL/min/{1.73_m2} (ref >59)
GFR, EST NON AFRICAN AMERICAN: 81 mL/min/{1.73_m2} (ref >59)
Glucose: 120 mg/dL — ABNORMAL HIGH (ref 65–99)
Potassium: 4.1 mmol/L (ref 3.5–5.2)
Sodium: 133 mmol/L — ABNORMAL LOW (ref 134–144)

## 2016-05-04 ENCOUNTER — Ambulatory Visit (INDEPENDENT_AMBULATORY_CARE_PROVIDER_SITE_OTHER)
Admission: RE | Admit: 2016-05-04 | Discharge: 2016-05-04 | Disposition: A | Discharge status: Home / Self Care | Payer: Medicare Other | Source: Ambulatory Visit | Attending: Pulmonary Disease | Admitting: Pulmonary Disease

## 2016-05-04 ENCOUNTER — Encounter: Payer: Self-pay | Admitting: Pulmonary Disease

## 2016-05-04 ENCOUNTER — Ambulatory Visit (INDEPENDENT_AMBULATORY_CARE_PROVIDER_SITE_OTHER): Payer: Medicare Other | Admitting: Pulmonary Disease

## 2016-05-04 VITALS — BP 138/66 | HR 80 | Ht 64.0 in | Wt 193.0 lb

## 2016-05-04 DIAGNOSIS — R059 Cough, unspecified: Secondary | ICD-10-CM

## 2016-05-04 DIAGNOSIS — Z720 Tobacco use: Secondary | ICD-10-CM | POA: Diagnosis not present

## 2016-05-04 DIAGNOSIS — G4733 Obstructive sleep apnea (adult) (pediatric): Secondary | ICD-10-CM

## 2016-05-04 DIAGNOSIS — J432 Centrilobular emphysema: Secondary | ICD-10-CM | POA: Diagnosis not present

## 2016-05-04 DIAGNOSIS — R05 Cough: Secondary | ICD-10-CM

## 2016-05-04 MED ORDER — ALBUTEROL SULFATE HFA 108 (90 BASE) MCG/ACT IN AERS: 1.0000 | INHALATION_SPRAY | Freq: Four times a day (QID) | RESPIRATORY_TRACT | 3 refills | Status: DC | PRN

## 2016-05-04 MED ORDER — UMECLIDINIUM-VILANTEROL 62.5-25 MCG/INH IN AEPB: 1.0000 | INHALATION_SPRAY | Freq: Every day | RESPIRATORY_TRACT | 0 refills | Status: DC

## 2016-05-04 NOTE — Assessment & Plan Note (Signed)
Unfortunately he recently had an episode of pneumonia and a COPD exacerbation which have worsened his symptoms. He is still wheezing a little bit on exam today but not too badly.  In general he has improved.  We talked about a better medication regimen. He had some urinary retention issues with Spiriva in the past, but he says he's willing to try something along those lines if it will help with his breathing. He is now taking Flomax which is helped with his urinary retention.  He is deconditioned and would benefit from pulmonary rehabilitation.  Plan: Stop Brovana Trial of Anoro Continue budesonide nebulized twice a day Because of ongoing wheezing have recommended that he use albuterol at least twice a day for the next few weeks Exercise regularly at home, I advised pulmonary rehabilitation but he like to try to do his own exercise regimen for a while first area Follow-up in one month to make sure that he's had it in the right direction, follow-up with whether or not he wants pulmonary rehabilitation and prescribe Anoro if she's not having problems with it. Samples were given today for Anoro.

## 2016-05-04 NOTE — Progress Notes (Signed)
Subjective:    Patient ID: William Dyer, male    DOB: [birth year], 71 y.o.   MRN: [id number]  Synopsis: [facility] paitent with COPD and a history of lung cacner. 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   HPI Chief Complaint  Patient presents with  . Follow-up    pt recently hospitalized for COPD- pt currently c/o sob with exertion, fatigue.    Tawan was hospitalized last month for a COPD exacerbation.  He thinks he may have had the flu back in January, but it doesn't sound like a flu test was every positive. He had cough, wheezing and dyspnea.  He was seen by his PCP who gave him doxycycline but this didn't help.  In February he took a steroid and more antibiotics given by urgent care.  Despite that he had severe dyspnea about two weeks later.    He has been compliant with CPAP.   Review of his records form [facility] shows that he was treated for a COPD exacerbation with steroids and antibiotics.  He was also diagnosed with Pneumonia.    He has been using albuterol prescribed by his PCP as well.  He still takes budesonide and pulmicort.  He was still smoking at the time of admission.  He is still suffering from sinus congestion and a scratchy throat which he attributes to allergies and is treating with claritin and flonase.  He still has some dyspnea with exertion.  He says that picking up sticks and doing yard work.    Past Medical History:  Diagnosis Date  . CAD (coronary artery disease)    Remote PCI; s/p CABG x 3 in Feb 2013  . COPD  (chronic obstructive pulmonary disease) ([place])   . Diabetes mellitus   . GERD (gastroesophageal reflux disease)   . Heart murmur   . Hyperlipidemia   . Hypothyroidism   . Lung cancer ([place]) 07/2000   status post right lower lobectomy for adenocarcinoma  . Obesity   . Pneumonia       Review of Systems  Constitutional: Negative for chills, fatigue and fever.  HENT: Negative for postnasal drip, rhinorrhea and sinus pressure.   Respiratory: Negative for cough, shortness of breath and wheezing.   Cardiovascular: Negative for chest pain, palpitations and leg swelling.       Objective:   Physical Exam  Vitals:   05/04/16 1416  BP: 138/66  Pulse: 80  SpO2: 96%  Weight: 193 lb (87.5 kg)  Height: '5\' 4"'$  (1.626 m)   RA   Gen: well appearing HENT: OP clear, TM's clear, neck supple PULM: wheezing bases B, normal percussion CV: RRR, no mgr, trace edema GI: BS+, soft, nontender Derm: no cyanosis or rash Psyche: normal mood and affect   CBC    Component Value Date/Time   WBC 16.2 (H) 05/15/2013 0404   RBC 3.84 (L) 05/15/2013 0404   HGB 12.1 (L) 05/15/2013 0404   HCT 35.6 (L) 05/15/2013 0404   PLT 223 05/15/2013 0404   MCV 92.7 05/15/2013 0404  MCH 31.5 05/15/2013 0404   MCHC 34.0 05/15/2013 0404   RDW 14.4 05/15/2013 0404   LYMPHSABS 1.5 05/12/2013 2249   MONOABS 1.6 (H) 05/12/2013 2249   EOSABS 0.0 05/12/2013 2249   BASOSABS 0.0 05/12/2013 2249    Hospitalization records from [facility] reviewed were he was care for for a COPD exacerbation.     Assessment & Plan:  COPD (chronic obstructive pulmonary disease) ([place]) Unfortunately he recently had an episode of pneumonia and a COPD exacerbation which have worsened his symptoms. He is still wheezing a little bit on exam today but not too badly.  In general he has improved.  We talked about a better medication regimen. He had some urinary retention issues with Spiriva in the past, but he says he's willing to try  something along those lines if it will help with his breathing. He is now taking Flomax which is helped with his urinary retention.  He is deconditioned and would benefit from pulmonary rehabilitation.  Plan: Stop Brovana Trial of Anoro Continue budesonide nebulized twice a day Because of ongoing wheezing have recommended that he use albuterol at least twice a day for the next few weeks Exercise regularly at home, I advised pulmonary rehabilitation but he like to try to do his own exercise regimen for a while first area Follow-up in one month to make sure that he's had it in the right direction, follow-up with whether or not he wants pulmonary rehabilitation and prescribe Anoro if she's not having problems with it. Samples were given today for Anoro.  Tobacco abuse Counseled at length to quit smoking today.  OSA (obstructive sleep apnea) Continue BIPAP daily at bedtime     Current Outpatient Prescriptions:  .  albuterol (PROVENTIL HFA;VENTOLIN HFA) 108 (90 Base) MCG/ACT inhaler, Inhale 1-2 puffs into the lungs every 6 (six) hours as needed for wheezing or shortness of breath., Disp: 18 g, Rfl: 3 .  arformoterol (BROVANA) 15 MCG/2ML NEBU, Take 2 mLs (15 mcg total) by nebulization 2 (two) times daily., Disp: 120 mL, Rfl: 11 .  aspirin 81 MG tablet, Take 81 mg by mouth daily., Disp: , Rfl:  .  budesonide (PULMICORT) 0.25 MG/2ML nebulizer solution, Take 2 mLs (0.25 mg total) by nebulization 2 (two) times daily., Disp: 60 mL, Rfl: 11 .  fluticasone (FLONASE) 50 MCG/ACT nasal spray, Place 1 spray into both nostrils as needed for allergies or rhinitis. 1 spray each nostril every AM, Disp: , Rfl:  .  guaiFENesin (MUCINEX) 600 MG 12 hr tablet, Take 600 mg by mouth 2 (two) times daily as needed for to loosen phlegm. For congestion, Disp: , Rfl:  .  ipratropium-albuterol (DUONEB) 0.5-2.5 (3) MG/3ML SOLN, Take 3 mLs by nebulization every 4 (four) hours as needed (for wheezing & SOB)., Disp: , Rfl:    .  levothyroxine (SYNTHROID, LEVOTHROID) 137 MCG tablet, Take 137 mcg by mouth daily., Disp: , Rfl:  .  lisinopril-hydrochlorothiazide (PRINZIDE,ZESTORETIC) 10-12.5 MG per tablet, Take 1 tablet by mouth daily. , Disp: , Rfl:  .  loratadine (CLARITIN) 10 MG tablet, Take 10 mg by mouth daily as needed for allergies or rhinitis. , Disp: , Rfl:  .  metFORMIN (GLUCOPHAGE) 1000 MG tablet, Take 1,000 mg by mouth 2 (two) times daily with a meal. , Disp: , Rfl:  .  nitroGLYCERIN (NITROSTAT) 0.4 MG SL tablet, Place 1 tablet (0.4 mg total) under the tongue every 5 (five) minutes as needed for chest pain (3 doses max)., Disp: 25 tablet,  Rfl: 3 .  senna-docusate (SENOKOT-S) 8.6-50 MG per tablet, Take 2 tablets by mouth 2 (two) times daily as needed for moderate constipation., Disp: 30 tablet, Rfl: 0 .  tamsulosin (FLOMAX) 0.4 MG CAPS capsule, Take 0.4 mg by mouth daily after supper. , Disp: , Rfl:  .  umeclidinium-vilanterol (ANORO ELLIPTA) 62.5-25 MCG/INH AEPB, Inhale 1 puff into the lungs daily., Disp: 2 each, Rfl: 0

## 2016-05-04 NOTE — Assessment & Plan Note (Signed)
Counseled at length to quit smoking today. 

## 2016-05-04 NOTE — Patient Instructions (Addendum)
Stop taking brovana twice a day Keep taking budesonide (pulmicort) Start taking Anoro every day no matter how you feel  Use the albuterol (or the duoneb) as needed for shortness of breath , but for the next two weeks take it twice a day at a minimum Let me know if you would like to go to pulmonary rehab and I will give you a referral We will call you with the results of today's CXR We will see you back in 1 month with a nurse practitioner or sooner if needed

## 2016-05-04 NOTE — Assessment & Plan Note (Signed)
Continue BIPAP daily at bedtime

## 2016-05-18 ENCOUNTER — Telehealth: Payer: Self-pay | Admitting: Pulmonary Disease

## 2016-05-18 MED ORDER — UMECLIDINIUM-VILANTEROL 62.5-25 MCG/INH IN AEPB: 1.0000 | INHALATION_SPRAY | Freq: Every day | RESPIRATORY_TRACT | 5 refills | Status: DC

## 2016-05-18 NOTE — Telephone Encounter (Signed)
Pt requesting refill on Anoro- states samples given to him at last office visit work well for him.  Rx has been sent to preferred pharmacy.  Nothing further needed.

## 2016-06-07 ENCOUNTER — Ambulatory Visit: Payer: Medicare Other | Admitting: Adult Health

## 2016-06-16 ENCOUNTER — Encounter: Payer: Self-pay | Admitting: *Deleted

## 2016-06-16 DIAGNOSIS — Z006 Encounter for examination for normal comparison and control in clinical research program: Secondary | ICD-10-CM

## 2016-06-16 NOTE — Progress Notes (Signed)
Subject came to [facility] for f/u scheduled visit in the [place]. Labs were drawn on 06/12/2016 and upon review showed Potassium level of 5.7.  Per Dr. Lia Foyer, patient notified for lab re-draw and were re-drawn locally on  06/15/16. Patient advised to not take any additional potassium supplements and watch potassium high foods while waiting for re-draw.  Results of 06/15/16 re-draw showed potassium level of 4.0. Patient notified of results.

## 2016-07-06 ENCOUNTER — Ambulatory Visit: Payer: Medicare Other | Admitting: Pulmonary Disease

## 2016-07-07 ENCOUNTER — Ambulatory Visit (INDEPENDENT_AMBULATORY_CARE_PROVIDER_SITE_OTHER): Payer: Medicare Other | Admitting: Adult Health

## 2016-07-07 ENCOUNTER — Encounter: Payer: Self-pay | Admitting: Adult Health

## 2016-07-07 DIAGNOSIS — G4733 Obstructive sleep apnea (adult) (pediatric): Secondary | ICD-10-CM

## 2016-07-07 DIAGNOSIS — J9611 Chronic respiratory failure with hypoxia: Secondary | ICD-10-CM

## 2016-07-07 DIAGNOSIS — J432 Centrilobular emphysema: Secondary | ICD-10-CM | POA: Diagnosis not present

## 2016-07-07 MED ORDER — UMECLIDINIUM-VILANTEROL 62.5-25 MCG/INH IN AEPB: 1.0000 | INHALATION_SPRAY | Freq: Every day | RESPIRATORY_TRACT | 5 refills | Status: DC

## 2016-07-07 NOTE — Progress Notes (Signed)
@Patient  ID: William Dyer, male    DOB: 07-08-1945, 71 y.o.   MRN: [id number]  Chief Complaint  Patient presents with  . Follow-up    COPD     Referring provider: Leota Jacobsen, MD  HPI: 71 year old male smoker followed for COPD.  Patient has a history of lung cancer status post right lower lobe lobectomy 2001(adenocarcinoma)  TEST /Events  PFT's 2009:  FEV1 1.73 (63%), ratio 55, DLCO 50% Urinary issues with spiriva, can't afford tudorza Trial of spiriva respimat 08/2013 >> still with urinary retention issues 2017 started BIPAP 16/12 with 2 L O2  Still smokes vape daily as of June 2017 March 2017 pulmonary function testing: Ratio 57%, FEV1 1.26 L (50% predicted, 22% change with bronchodilator which is greater than 200 mL), FVC 2.20 L (64% predicted), DLCO 13.2 (54% predicted).  07/07/2016 Follow up : COPD  Patient returns for a two-month follow-up. Last visit. Patient was changed from [place] to [facility] . He says he feels it is helping .  Has had trouble in past with urinary issues with LAMA. Has not noticed any issues since starting.  Remains on Budesonide neb Twice daily  .  Feels his breathing is doing okay . Feels the muggy hot weather is not good for his breathing .  Patient continues to smoke, smoking cessation discussed. Does yard work. Plays blue grass, lead singer/plays guitar .  On ACE  Denies daily cough .  PVX and Prevnar utd.   He remains on BIPAP At bedtime with oxygen 1.5L . Wears each night for 8-9 hr .  Doing well except he was sent wrong mask . He has contacted DME and mask is on the way.  Download shows excellent compliance with avg usage at 9 hr. AHI 0.   Allergies  Allergen Reactions  . Rosuvastatin Calcium     Failed lipitor and Zocor due to muscle aches  . Statins     Failed lipitor and Zocor due to muscle aches  . Sulfonamide Derivatives Other (See Comments)    Patient "drasws up" muscularly    Immunization History  Administered Date(s)  Administered  . Influenza Split 10/25/2010, 11/08/2011, 10/22/2014  . Influenza Whole 11/07/2009  . Influenza, High Dose Seasonal PF 10/28/2015  . Influenza,inj,Quad PF,36+ Mos 11/07/2012  . Influenza-Unspecified 11/06/2013  . Pneumococcal Conjugate-13 11/06/2013  . Pneumococcal Polysaccharide-23 02/06/2007, 02/10/2012    Past Medical History:  Diagnosis Date  . CAD (coronary artery disease)    Remote PCI; s/p CABG x 3 in Feb 2013  . COPD (chronic obstructive pulmonary disease) ([place])   . Diabetes mellitus   . GERD (gastroesophageal reflux disease)   . Heart murmur   . Hyperlipidemia   . Hypothyroidism   . Lung cancer ([place]) 07/2000   status post right lower lobectomy for adenocarcinoma  . Obesity   . Pneumonia     Tobacco History: History  Smoking Status  . Current Every Day Smoker  . Packs/day: 1.50  . Years: 50.00  . Types: Cigarettes  . Last attempt to quit: 03/14/2011  Smokeless Tobacco  . Never Used    Comment: PATIENT USES VAPOR CIG and has started smoking tobacco cigarettes 10-12 per week since death of wife 18 months ago   Ready to quit: No Counseling given: Yes   Outpatient Encounter Prescriptions as of 07/07/2016  Medication Sig  . albuterol (PROVENTIL HFA;VENTOLIN HFA) 108 (90 Base) MCG/ACT inhaler Inhale 1-2 puffs into the lungs every 6 (six) hours as needed  for wheezing or shortness of breath.  Marland Kitchen aspirin 81 MG tablet Take 81 mg by mouth daily.  . budesonide (PULMICORT) 0.25 MG/2ML nebulizer solution Take 2 mLs (0.25 mg total) by nebulization 2 (two) times daily.  . fluticasone (FLONASE) 50 MCG/ACT nasal spray Place 1 spray into both nostrils as needed for allergies or rhinitis. 1 spray each nostril every AM  . guaiFENesin (MUCINEX) 600 MG 12 hr tablet Take 600 mg by mouth 2 (two) times daily as needed for to loosen phlegm. For congestion  . ipratropium-albuterol (DUONEB) 0.5-2.5 (3) MG/3ML SOLN Take 3 mLs by nebulization every 4 (four) hours as needed (for  wheezing & SOB).  Marland Kitchen levothyroxine (SYNTHROID, LEVOTHROID) 137 MCG tablet Take 137 mcg by mouth daily.  Marland Kitchen lisinopril-hydrochlorothiazide (PRINZIDE,ZESTORETIC) 10-12.5 MG per tablet 1/2 tab by mouth once daily  . loratadine (CLARITIN) 10 MG tablet Take 10 mg by mouth daily as needed for allergies or rhinitis.   . metFORMIN (GLUCOPHAGE) 1000 MG tablet Take 1,000 mg by mouth 2 (two) times daily with a meal.   . nitroGLYCERIN (NITROSTAT) 0.4 MG SL tablet Place 1 tablet (0.4 mg total) under the tongue every 5 (five) minutes as needed for chest pain (3 doses max).  Marland Kitchen senna-docusate (SENOKOT-S) 8.6-50 MG per tablet Take 2 tablets by mouth 2 (two) times daily as needed for moderate constipation.  . tamsulosin (FLOMAX) 0.4 MG CAPS capsule Take 0.4 mg by mouth daily after supper.   . umeclidinium-vilanterol (ANORO ELLIPTA) 62.5-25 MCG/INH AEPB Inhale 1 puff into the lungs daily.  . [DISCONTINUED] arformoterol (BROVANA) 15 MCG/2ML NEBU Take 2 mLs (15 mcg total) by nebulization 2 (two) times daily. (Patient not taking: Reported on 07/07/2016)   No facility-administered encounter medications on file as of 07/07/2016.      Review of Systems  Constitutional:   No  weight loss, night sweats,  Fevers, chills,fatigue, or  lassitude.  HEENT:   No headaches,  Difficulty swallowing,  Tooth/dental problems, or  Sore throat,                No sneezing, itching, ear ache, nasal congestion, post nasal drip,   CV:  No chest pain,  Orthopnea, PND, swelling in lower extremities, anasarca, dizziness, palpitations, syncope.   GI  No heartburn, indigestion, abdominal pain, nausea, vomiting, diarrhea, change in bowel habits, loss of appetite, bloody stools.   Resp:  No chest wall deformity  Skin: no rash or lesions.  GU: no dysuria, change in color of urine, no urgency or frequency.  No flank pain, no hematuria   MS:  No joint pain or swelling.  No decreased range of motion.  No back pain.    Physical Exam  BP  108/62 (BP Location: Left Arm, Cuff Size: Normal)   Pulse 74   Ht 5\' 4"  (1.626 m)   Wt 197 lb 6.4 oz (89.5 kg)   SpO2 93%   BMI 33.88 kg/m   GEN: A/Ox3; pleasant , NAD, elderly    HEENT:  [place]/AT,  EACs-clear, TMs-wnl, NOSE-clear, THROAT-clear, no lesions, no postnasal drip or exudate noted.   NECK:  Supple w/ fair ROM; no JVD; normal carotid impulses w/o bruits; no thyromegaly or nodules palpated; no lymphadenopathy.    RESP  Decreased BS in bases  no accessory muscle use, no dullness to percussion  CARD:  RRR, no m/r/g, no peripheral edema, pulses intact, no cyanosis or clubbing.  GI:   Soft & nt; nml bowel sounds; no organomegaly or masses detected.  Musco: Warm bil, no deformities or joint swelling noted.   Neuro: alert, no focal deficits noted.    Skin: Warm, no lesions or rashes    Lab Results:  CBC  BNP No results found for: BNP  ProBNP Imaging: No results found.   Assessment & Plan:   COPD (chronic obstructive pulmonary disease) ([place]) Stable on current regimen  Smoking cessation is key   Plan  Patient Instructions  Continue on ANORO 1 puff daily  Continue on Budesonide neb Twice daily  .  Rinse after inhaler/nebs.  Continue on BIPAP At bedtime  .  Work on not smoking .  follow up Dr. Lake Bells in 3 months and As needed   Please contact office for sooner follow up if symptoms do not improve or worsen or seek emergency care      Chronic respiratory failure Cont on BIPAP and O2  At bedtime     OSA (obstructive sleep apnea) Doing well on BIPAP At bedtime   Cont BIPAP w/ O2      Rexene Edison, NP 07/07/2016

## 2016-07-07 NOTE — Progress Notes (Signed)
Reviewed, agree 

## 2016-07-07 NOTE — Assessment & Plan Note (Signed)
Stable on current regimen  Smoking cessation is key   Plan  Patient Instructions  Continue on ANORO 1 puff daily  Continue on Budesonide neb Twice daily  .  Rinse after inhaler/nebs.  Continue on BIPAP At bedtime  .  Work on not smoking .  follow up Dr. Lake Bells in 3 months and As needed   Please contact office for sooner follow up if symptoms do not improve or worsen or seek emergency care

## 2016-07-07 NOTE — Assessment & Plan Note (Signed)
Doing well on BIPAP At bedtime   Cont BIPAP w/ O2

## 2016-07-07 NOTE — Assessment & Plan Note (Signed)
Cont on BIPAP and O2  At bedtime

## 2016-07-07 NOTE — Addendum Note (Signed)
Addended by: Parke Poisson E on: 07/07/2016 12:44 PM   Modules accepted: Orders

## 2016-07-07 NOTE — Patient Instructions (Addendum)
Continue on ANORO 1 puff daily  Continue on Budesonide neb Twice daily  .  Rinse after inhaler/nebs.  Continue on BIPAP At bedtime  .  Work on not smoking .  follow up Dr. Lake Bells in 3 months and As needed   Please contact office for sooner follow up if symptoms do not improve or worsen or seek emergency care

## 2016-07-18 NOTE — Progress Notes (Signed)
Reviewed, agree 

## 2016-08-15 ENCOUNTER — Ambulatory Visit (INDEPENDENT_AMBULATORY_CARE_PROVIDER_SITE_OTHER)
Admission: RE | Admit: 2016-08-15 | Discharge: 2016-08-15 | Disposition: A | Discharge status: Home / Self Care | Payer: Medicare Other | Source: Ambulatory Visit | Attending: Acute Care | Admitting: Acute Care

## 2016-08-15 ENCOUNTER — Encounter (INDEPENDENT_AMBULATORY_CARE_PROVIDER_SITE_OTHER): Payer: Self-pay

## 2016-08-15 DIAGNOSIS — Z87891 Personal history of nicotine dependence: Secondary | ICD-10-CM | POA: Diagnosis not present

## 2016-08-15 DIAGNOSIS — F17210 Nicotine dependence, cigarettes, uncomplicated: Secondary | ICD-10-CM

## 2016-08-17 ENCOUNTER — Other Ambulatory Visit: Payer: Self-pay | Admitting: Acute Care

## 2016-08-17 DIAGNOSIS — F17210 Nicotine dependence, cigarettes, uncomplicated: Principal | ICD-10-CM

## 2016-09-15 ENCOUNTER — Telehealth: Payer: Self-pay | Admitting: *Deleted

## 2016-09-15 NOTE — Telephone Encounter (Signed)
Spoke with patient for T5-M9 visit in the [employer].  Patient has had a MRI of the lumbar spine and started on meloxicam 7.5mg  for associated back and leg pain. No aes or saes to report.  Reminded of next appointment on 12/02/2016.

## 2016-10-28 ENCOUNTER — Encounter: Payer: Self-pay | Admitting: Pulmonary Disease

## 2016-10-28 ENCOUNTER — Ambulatory Visit (INDEPENDENT_AMBULATORY_CARE_PROVIDER_SITE_OTHER): Payer: Medicare Other | Admitting: Pulmonary Disease

## 2016-10-28 VITALS — BP 128/66 | HR 73 | Ht 64.0 in | Wt 197.0 lb

## 2016-10-28 DIAGNOSIS — G4733 Obstructive sleep apnea (adult) (pediatric): Secondary | ICD-10-CM | POA: Diagnosis not present

## 2016-10-28 DIAGNOSIS — J432 Centrilobular emphysema: Secondary | ICD-10-CM | POA: Diagnosis not present

## 2016-10-28 DIAGNOSIS — J9611 Chronic respiratory failure with hypoxia: Secondary | ICD-10-CM | POA: Diagnosis not present

## 2016-10-28 MED ORDER — ALBUTEROL SULFATE HFA 108 (90 BASE) MCG/ACT IN AERS: 1.0000 | INHALATION_SPRAY | Freq: Four times a day (QID) | RESPIRATORY_TRACT | 3 refills | Status: DC | PRN

## 2016-10-28 NOTE — Patient Instructions (Addendum)
Back pain/Spinal stenosis: I recommend you see Dierdre Harness  For COPD: conitnue Anoro and brovana  For Sleep Apnea: Keep using BIPAP with O2 at night  We will see you back in 6 months or sooner if needed

## 2016-10-28 NOTE — Progress Notes (Signed)
Subjective:    Patient ID: William Dyer, male    DOB: Jul 03, 1945, 71 y.o.   MRN: [id number]  Synopsis: [facility] [place] paitent with COPD and a history of lung cacner. 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 Follow-up    c/o stable DOE, sometimes prod cough with white mucus.     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  He has been dealing with severe back pain since the last visit.  He has spinal stenosis and disc disease that is giving him a lot of trouble.  The pain is radiating to his hips and legs.    He has been taking Anoro > he thinks that it might be giving him trouble with his urinary stream > he doesn't feel like it is opening him up as much as before > he gets a lot of trouble with powder in his mouth from it.  No bronchitis or pneumonia since the last visit.   He is still using and benefitting from his BIPAP machine.    Past Medical History:  Diagnosis Date  . CAD (coronary artery disease)    Remote PCI; s/p CABG x 3 in Feb 2013  . COPD (chronic obstructive pulmonary disease) ([place])   . Diabetes mellitus   . GERD (gastroesophageal reflux disease)   . Heart murmur   . Hyperlipidemia   . Hypothyroidism   . Lung cancer ([place]) 07/2000   status post right lower lobectomy for adenocarcinoma  . Obesity   . Pneumonia       Review of Systems  Constitutional: Negative for chills, fatigue and fever.  HENT: Negative for postnasal drip, rhinorrhea and sinus pressure.   Respiratory: Negative for cough, shortness of breath and wheezing.   Cardiovascular: Negative for chest pain, palpitations and leg swelling.       Objective:   Physical Exam  Vitals:   10/28/16 1128  BP: 128/66  Pulse: 73  SpO2: 94%    Weight: 197 lb (89.4 kg)  Height: 5\' 4"  (1.626 m)   RA   Gen: chronically ill appearing HENT: OP clear, TM's clear, neck supple PULM: wheezing bilaterally B, normal percussion CV: RRR, no mgr, trace edema GI: BS+, soft, nontender Derm: no cyanosis or rash Psyche: normal mood and affect    CBC    Component Value Date/Time   WBC 16.2 (H) 05/15/2013 0404   RBC 3.84 (L) 05/15/2013 0404   HGB 12.1 (L) 05/15/2013 0404   HCT 35.6 (L) 05/15/2013 0404   PLT 223 05/15/2013 0404   MCV 92.7 05/15/2013 0404   MCH 31.5 05/15/2013 0404   MCHC 34.0 05/15/2013 0404   RDW 14.4 05/15/2013 0404   LYMPHSABS 1.5 05/12/2013 2249   MONOABS 1.6 (H) 05/12/2013 2249   EOSABS 0.0 05/12/2013 2249   BASOSABS 0.0 05/12/2013 2249    PFT: PFT's 2009:  FEV1 1.73 (63%), ratio 55, DLCO 50% March 2017 pulmonary function testing: Ratio 57%, FEV1 1.26 L (50% predicted, 22% change with bronchodilator which is greater than 200 mL), FVC 2.20 L (64% predicted), DLCO 13.2 (54% predicted).    Assessment & Plan:   Centrilobular emphysema ([place])  Chronic respiratory failure with hypoxia ([facility])  OSA (obstructive  sleep apnea)  Discussion: This has been a stable interval for William Dyer. He's been having a lot of back pain recently and is considering a surgical evaluation. He continues to feel short of breath with exertion, but by my estimation this has been stable over the last 2-3 years that I have known him. We've tried multiple bronchodilator regimens and none of them have ever been overwhelmingly successful. I think it's best to continue with Anoro and Pulmicort for now.   Plan: Back pain/Spinal stenosis: I recommend you see Dierdre Harness  For COPD: conitnue Anoro and brovana  For Sleep Apnea: Keep using BIPAP with O2 at night  We will see you back in 6 months or sooner if needed   Current Outpatient Prescriptions:  .  albuterol (PROVENTIL HFA;VENTOLIN HFA) 108 (90 Base) MCG/ACT inhaler, Inhale 1-2 puffs  into the lungs every 6 (six) hours as needed for wheezing or shortness of breath., Disp: 18 g, Rfl: 3 .  aspirin 81 MG tablet, Take 81 mg by mouth daily., Disp: , Rfl:  .  budesonide (PULMICORT) 0.25 MG/2ML nebulizer solution, Take 2 mLs (0.25 mg total) by nebulization 2 (two) times daily., Disp: 60 mL, Rfl: 11 .  fluticasone (FLONASE) 50 MCG/ACT nasal spray, Place 1 spray into both nostrils as needed for allergies or rhinitis. 1 spray each nostril every AM, Disp: , Rfl:  .  guaiFENesin (MUCINEX) 600 MG 12 hr tablet, Take 600 mg by mouth 2 (two) times daily as needed for to loosen phlegm. For congestion, Disp: , Rfl:  .  ipratropium-albuterol (DUONEB) 0.5-2.5 (3) MG/3ML SOLN, Take 3 mLs by nebulization every 4 (four) hours as needed (for wheezing & SOB)., Disp: , Rfl:  .  levothyroxine (SYNTHROID, LEVOTHROID) 137 MCG tablet, Take 137 mcg by mouth daily., Disp: , Rfl:  .  lisinopril-hydrochlorothiazide (PRINZIDE,ZESTORETIC) 10-12.5 MG per tablet, 1/2 tab by mouth once daily, Disp: , Rfl:  .  loratadine (CLARITIN) 10 MG tablet, Take 10 mg by mouth daily as needed for allergies or rhinitis. , Disp: , Rfl:  .  metFORMIN (GLUCOPHAGE) 1000 MG tablet, Take 1,000 mg by mouth 2 (two) times daily with a meal. , Disp: , Rfl:  .  nitroGLYCERIN (NITROSTAT) 0.4 MG SL tablet, Place 1 tablet (0.4 mg total) under the tongue every 5 (five) minutes as needed for chest pain (3 doses max)., Disp: 25 tablet, Rfl: 3 .  senna-docusate (SENOKOT-S) 8.6-50 MG per tablet, Take 2 tablets by mouth 2 (two) times daily as needed for moderate constipation., Disp: 30 tablet, Rfl: 0 .  tamsulosin (FLOMAX) 0.4 MG CAPS capsule, Take 0.4 mg by mouth daily after supper. , Disp: , Rfl:  .  umeclidinium-vilanterol (ANORO ELLIPTA) 62.5-25 MCG/INH AEPB, Inhale 1 puff into the lungs daily., Disp: 60 each, Rfl: 5

## 2016-11-03 ENCOUNTER — Telehealth: Payer: Self-pay | Admitting: Pulmonary Disease

## 2016-11-03 MED ORDER — ALBUTEROL SULFATE HFA 108 (90 BASE) MCG/ACT IN AERS: 1.0000 | INHALATION_SPRAY | Freq: Four times a day (QID) | RESPIRATORY_TRACT | 6 refills | Status: DC | PRN

## 2016-11-03 NOTE — Telephone Encounter (Signed)
Ventolin is not covered by the pts insurance---they prefer to give the pt proair. This has been changed and nothing further is needed.

## 2016-12-02 ENCOUNTER — Encounter: Payer: Self-pay | Admitting: *Deleted

## 2016-12-02 DIAGNOSIS — Z006 Encounter for examination for normal comparison and control in clinical research program: Secondary | ICD-10-CM

## 2016-12-02 NOTE — Progress Notes (Addendum)
Patient to [facility] for T6-M12 in the [facility] study.  No c/o, aes or saes to report.  Subject 100% compliant with medications and new medication dispensed.  Follow up phone call and next clinic visit scheduled.  Subject re-consented to: [place] Version 5.1 28Aug2018 Local version [phone]

## 2017-01-03 NOTE — Progress Notes (Signed)
Patient ID: William Dyer, male   DOB: 03-28-1945, 71 y.o.   MRN: [id number]   This is a 71 y.o. male patient who has a history of coronary artery disease status post CABG x3 in February 2013.  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  All other systems reviewed and negative  General: Affect appropriate Chronically ill obese white male  Affect appropriate HEENT: normal Neck supple with no adenopathy JVP normal no bruits no thyromegaly Lungs clear with no wheezing and good diaphragmatic motion Heart:  S1/S2 no murmur, no rub, gallop or click PMI normal Abdomen: benighn, BS positve, no tenderness, no AAA no bruit.  No HSM or HJR Distal pulses intact with no bruits No edema Neuro non-focal Skin warm and dry No muscular weakness   Current Outpatient Medications  Medication Sig Dispense Refill  . albuterol (PROAIR HFA) 108 (90 Base) MCG/ACT inhaler Inhale 1-2 puffs into the lungs every 6 (six) hours as needed for wheezing or shortness of breath. 1 Inhaler 6  . aspirin 81 MG tablet Take 81 mg by mouth daily.    . budesonide (PULMICORT) 0.25 MG/2ML nebulizer solution Take 2 mLs (0.25 mg total) by nebulization 2 (two) times daily. 60 mL 11  . fluticasone (FLONASE) 50 MCG/ACT nasal spray Place 1 spray into both nostrils as needed for allergies or rhinitis. 1 spray each nostril every AM    . guaiFENesin (MUCINEX) 600 MG 12 hr tablet Take 600 mg by mouth 2 (two) times daily as needed for to loosen phlegm. For congestion     . ipratropium-albuterol (DUONEB) 0.5-2.5 (3) MG/3ML SOLN Take 3 mLs by nebulization every 4 (four) hours as needed (for wheezing & SOB).    Marland Kitchen levothyroxine (SYNTHROID, LEVOTHROID) 137 MCG tablet Take 137 mcg by mouth daily.    Marland Kitchen lisinopril-hydrochlorothiazide (PRINZIDE,ZESTORETIC) 10-12.5 MG per tablet 1/2 tab by mouth once daily    . loratadine (CLARITIN) 10 MG tablet Take 10 mg by mouth daily as needed for allergies or rhinitis.     . metFORMIN (GLUCOPHAGE) 1000 MG tablet Take 1,000 mg by mouth 2 (two) times daily with a meal.     . nitroGLYCERIN (NITROSTAT) 0.4 MG SL tablet Place 1 tablet (0.4 mg total) under the tongue every 5 (five) minutes as needed for chest pain (3 doses max). 25 tablet 3  . senna-docusate (SENOKOT-S) 8.6-50 MG per tablet Take 2 tablets by mouth 2 (two) times daily as needed for moderate constipation. 30 tablet 0  . tamsulosin (FLOMAX) 0.4 MG CAPS capsule Take 0.4 mg by mouth daily after supper.     . umeclidinium-vilanterol (ANORO ELLIPTA) 62.5-25 MCG/INH AEPB Inhale 1 puff into the lungs daily. 60 each 5   No current facility-administered medications for this visit.     Allergies  Rosuvastatin calcium; Statins; and Sulfonamide derivatives  Electrocardiogram:  ST rate 108 otherwise normal  01/21/14  09/03/14  SR rate 84  RBBB  RBBB new since previous 01/05/17 SR rate 86 RBBB LPFB   Assessment and Plan  CAD: chest pains are  atypical and non exertional normal myovue 2014 Nitro called in if helps will need further w/u   Chol: Praluant too expensive in CLEAR research trial now LDL 107 f/u [place]  DM: Discussed low carb diet.  Target hemoglobin A1c is 6.5 or less.  Continue current medications.  COPD:  Nebulizer helps Humidity is hard for him  F/U McQu[facility] CT ok no nodules/ cancer  Thyroid    Lab Results  Component Value Date   TSH 1.380 05/13/2013    RBBB  Likely related to lung disease f/u ECG in 6 months  12/21/15  SR rate 76 RBBB no change    Jenkins Rouge

## 2017-01-05 ENCOUNTER — Ambulatory Visit: Payer: Medicare Other | Admitting: Cardiovascular Disease

## 2017-01-05 ENCOUNTER — Encounter: Payer: Self-pay | Admitting: Cardiovascular Disease

## 2017-01-05 VITALS — BP 132/66 | HR 88 | Ht 64.0 in | Wt 197.0 lb

## 2017-01-05 DIAGNOSIS — E782 Mixed hyperlipidemia: Secondary | ICD-10-CM

## 2017-01-05 DIAGNOSIS — I2583 Coronary atherosclerosis due to lipid rich plaque: Secondary | ICD-10-CM | POA: Diagnosis not present

## 2017-01-05 DIAGNOSIS — I2510 Atherosclerotic heart disease of native coronary artery without angina pectoris: Secondary | ICD-10-CM

## 2017-01-05 MED ORDER — NITROGLYCERIN 0.4 MG SL SUBL: 0.4000 mg | SUBLINGUAL_TABLET | SUBLINGUAL | 3 refills | Status: DC | PRN

## 2017-01-05 NOTE — Patient Instructions (Addendum)

## 2017-01-27 ENCOUNTER — Telehealth: Payer: Self-pay | Admitting: Pulmonary Disease

## 2017-01-27 NOTE — Telephone Encounter (Signed)
Spoke with Apolonio Schneiders at pharmacy, verified Budesonide recs.  Nothing further needed.

## 2017-03-16 ENCOUNTER — Telehealth: Payer: Self-pay | Admitting: *Deleted

## 2017-03-16 NOTE — Telephone Encounter (Signed)
Spoke with subject for V7M9 in the Clear Research study.  No c/o, aes or saes to report.  Next clinic visit verified for Jun 09, 2017 @1000 .

## 2017-04-26 ENCOUNTER — Encounter: Payer: Self-pay | Admitting: Pulmonary Disease

## 2017-04-26 ENCOUNTER — Ambulatory Visit (INDEPENDENT_AMBULATORY_CARE_PROVIDER_SITE_OTHER): Payer: Medicare Other | Admitting: Pulmonary Disease

## 2017-04-26 VITALS — BP 126/72 | HR 87 | Ht 63.0 in | Wt 198.0 lb

## 2017-04-26 DIAGNOSIS — J432 Centrilobular emphysema: Secondary | ICD-10-CM | POA: Diagnosis not present

## 2017-04-26 DIAGNOSIS — G4733 Obstructive sleep apnea (adult) (pediatric): Secondary | ICD-10-CM

## 2017-04-26 DIAGNOSIS — J9611 Chronic respiratory failure with hypoxia: Secondary | ICD-10-CM

## 2017-04-26 MED ORDER — IPRATROPIUM-ALBUTEROL 0.5-2.5 (3) MG/3ML IN SOLN: 3.0000 mL | RESPIRATORY_TRACT | 5 refills | Status: DC | PRN

## 2017-04-26 NOTE — Progress Notes (Signed)
Subjective:    Patient ID: William Dyer, male    DOB: [birth year], 72 y.o.   MRN: [id number]  Synopsis: [facility] paitent with COPD and a history of lung cacner. 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 Follow-up    SOB with activity , some noproductive cough,   Higinio has been "fair".  He says that back in October or November after his flu shot.  He says that he thinks he had the flu.  He had labored breathing.  He needed prednisone and an antibiotic.    He says that he is using his CPAP night.  He had a lot leak last night that really drove him nuts.    He has been having a lot of back pain lately.  He saw his PCP and they referred him to someone with [facility] and told him he had spinal stenosis and a slipped disk.  He was given a shot that he says didn't help.  He went to a pain clinic after that.    He is taking Anoro and pulmicort.   Past Medical History:  Diagnosis Date  . CAD (coronary artery disease)    Remote PCI; s/p CABG x 3 in Feb 2013  . COPD (chronic obstructive pulmonary disease) ([place])   . Diabetes mellitus   . GERD (gastroesophageal reflux disease)   . Heart murmur   . Hyperlipidemia   . Hypothyroidism   . Lung cancer ([place]) 07/2000   status post right lower lobectomy for adenocarcinoma  . Obesity   . Pneumonia       Review of Systems  Constitutional: Negative for chills, fatigue and fever.  HENT: Negative for postnasal drip, rhinorrhea and sinus pressure.   Respiratory: Positive for shortness of breath. Negative for cough and wheezing.   Cardiovascular: Negative for chest pain, palpitations and leg swelling.       Objective:   Physical Exam  Vitals:   04/26/17 1152 04/26/17 1154  BP:   126/72  Pulse:  87  SpO2:  94%  Weight: 198 lb (89.8 kg)   Height: 5\' 3"  (1.6 m)    RA   Gen: well appearing HENT: OP clear, TM's clear, neck supple PULM: Wheezing bilaterally B, normal percussion CV: RRR, no mgr, trace edema GI: BS+, soft, nontender Derm: no cyanosis or rash Psyche: normal mood and affect    CBC    Component Value Date/Time   WBC 16.2 (H) 05/15/2013 0404   RBC 3.84 (L) 05/15/2013 0404   HGB 12.1 (L) 05/15/2013 0404   HCT 35.6 (L) 05/15/2013 0404   PLT 223 05/15/2013 0404   MCV 92.7 05/15/2013 0404   MCH 31.5 05/15/2013 0404   MCHC 34.0 05/15/2013 0404   RDW 14.4 05/15/2013 0404   LYMPHSABS 1.5 05/12/2013 2249   MONOABS 1.6 (H) 05/12/2013 2249   EOSABS 0.0 05/12/2013 2249   BASOSABS 0.0 05/12/2013 2249    CPAP compliance report: February 15 - April 22, 2017 showed use 30 out of 30 days greater than 40%, mean pressure 17 cm of water  PFT: PFT's 2009:  FEV1 1.73 (63%), ratio 55, DLCO 50% March 2017 pulmonary function testing: Ratio 57%, FEV1 1.26 L (50% predicted, 22% change  with bronchodilator which is greater than 200 mL), FVC 2.20 L (64% predicted), DLCO 13.2 (54% predicted).    Assessment & Plan:   No diagnosis found.  He is still complaining of dyspnea.  He is still smoking.  He still has severe COPD.  So there is no doubt that he is short of breath with ongoing tobacco use and severe COPD.  He does not think that Anoro helps.  He like to try changing to something else.  We have tried multiple medicines in the past and only nebulized meds seem to be the most helpful though Garlon Hatchet never did much for him.  Plan: Severe COPD: Stop Anoro Take DuoNeb 3 times a day Take budesonide twice a day Exercise regularly  Obstructive sleep apnea: Keep using CPAP every night  Tobacco abuse: Stop smoking  Follow-up 4-6 months or sooner if needed  Current Outpatient Medications:  .  albuterol (PROAIR HFA) 108 (90 Base) MCG/ACT inhaler, Inhale 1-2  puffs into the lungs every 6 (six) hours as needed for wheezing or shortness of breath., Disp: 1 Inhaler, Rfl: 6 .  aspirin 81 MG tablet, Take 81 mg by mouth daily., Disp: , Rfl:  .  budesonide (PULMICORT) 0.25 MG/2ML nebulizer solution, Take 2 mLs (0.25 mg total) by nebulization 2 (two) times daily., Disp: 60 mL, Rfl: 11 .  fluticasone (FLONASE) 50 MCG/ACT nasal spray, Place 1 spray into both nostrils as needed for allergies or rhinitis. 1 spray each nostril every AM, Disp: , Rfl:  .  guaiFENesin (MUCINEX) 600 MG 12 hr tablet, Take 600 mg by mouth 2 (two) times daily as needed for to loosen phlegm. For congestion, Disp: , Rfl:  .  ipratropium-albuterol (DUONEB) 0.5-2.5 (3) MG/3ML SOLN, Take 3 mLs by nebulization every 4 (four) hours as needed (for wheezing & SOB)., Disp: , Rfl:  .  levothyroxine (SYNTHROID, LEVOTHROID) 137 MCG tablet, Take 137 mcg by mouth daily., Disp: , Rfl:  .  lisinopril-hydrochlorothiazide (PRINZIDE,ZESTORETIC) 10-12.5 MG per tablet, 1/2 tab by mouth once daily, Disp: , Rfl:  .  loratadine (CLARITIN) 10 MG tablet, Take 10 mg by mouth daily as needed for allergies or rhinitis. , Disp: , Rfl:  .  metFORMIN (GLUCOPHAGE) 1000 MG tablet, Take 1,000 mg by mouth 2 (two) times daily with a meal. , Disp: , Rfl:  .  nitroGLYCERIN (NITROSTAT) 0.4 MG SL tablet, Place 1 tablet (0.4 mg total) under the tongue every 5 (five) minutes as needed for chest pain (3 doses max)., Disp: 25 tablet, Rfl: 3 .  senna-docusate (SENOKOT-S) 8.6-50 MG per tablet, Take 2 tablets by mouth 2 (two) times daily as needed for moderate constipation., Disp: 30 tablet, Rfl: 0 .  tamsulosin (FLOMAX) 0.4 MG CAPS capsule, Take 0.4 mg by mouth daily after supper. , Disp: , Rfl:  .  umeclidinium-vilanterol (ANORO ELLIPTA) 62.5-25 MCG/INH AEPB, Inhale 1 puff into the lungs daily., Disp: 60 each, Rfl: 5

## 2017-04-26 NOTE — Patient Instructions (Signed)
Severe COPD: Stop Anoro Take DuoNeb 3 times a day Take budesonide twice a day Exercise regularly  Obstructive sleep apnea: Keep using CPAP every night  Tobacco abuse: Stop smoking  Follow-up 4-6 months or sooner if needed

## 2017-05-01 ENCOUNTER — Telehealth: Payer: Self-pay | Admitting: Pulmonary Disease

## 2017-05-01 MED ORDER — ALBUTEROL SULFATE (2.5 MG/3ML) 0.083% IN NEBU: 2.5000 mg | INHALATION_SOLUTION | Freq: Two times a day (BID) | RESPIRATORY_TRACT | 12 refills | Status: DC

## 2017-05-01 NOTE — Telephone Encounter (Signed)
Change to albuterol neb bid

## 2017-05-01 NOTE — Telephone Encounter (Signed)
Spoke with pt. States that Duoneb is not working for him. Reports that it's making him breath harder and causes chest tightness. Budesonide is not causing any issues. Advised pt not take anymore of Duoneb until he hears back from [place]. Pt would like to have something else in the place of Duoneb.  BQ - please advise.

## 2017-05-01 NOTE — Telephone Encounter (Signed)
Called pt letting him know we were changing his neb from duoneb to albuterol.  Pt expressed understanding and stated for me to send script to [facility].  Faxed script of albuterol neb to [facility] and removed duoneb from pt's med list.  Nothing further needed at this time.

## 2017-06-09 ENCOUNTER — Encounter: Payer: Self-pay | Admitting: *Deleted

## 2017-06-09 DIAGNOSIS — Z006 Encounter for examination for normal comparison and control in clinical research program: Secondary | ICD-10-CM

## 2017-06-09 NOTE — Progress Notes (Signed)
Subject to research clinic for visit T8-M18 in the Clear Research study.  No cos, aes or saes to report. 100% compliant with meds and new drug dispensed.  Next f/u phone call and clinic visit scheduled.  Re-consented to [place] Version 6.0.

## 2017-09-29 ENCOUNTER — Ambulatory Visit: Payer: Medicare Other

## 2017-10-02 ENCOUNTER — Ambulatory Visit (INDEPENDENT_AMBULATORY_CARE_PROVIDER_SITE_OTHER)
Admission: RE | Admit: 2017-10-02 | Discharge: 2017-10-02 | Disposition: A | Discharge status: Home / Self Care | Payer: Medicare Other | Source: Ambulatory Visit | Attending: Acute Care | Admitting: Acute Care

## 2017-10-02 DIAGNOSIS — F17210 Nicotine dependence, cigarettes, uncomplicated: Secondary | ICD-10-CM

## 2017-10-10 ENCOUNTER — Other Ambulatory Visit: Payer: Self-pay | Admitting: Acute Care

## 2017-10-10 ENCOUNTER — Telehealth: Payer: Self-pay | Admitting: Pulmonary Disease

## 2017-10-10 DIAGNOSIS — Z122 Encounter for screening for malignant neoplasm of respiratory organs: Secondary | ICD-10-CM

## 2017-10-10 DIAGNOSIS — F17210 Nicotine dependence, cigarettes, uncomplicated: Principal | ICD-10-CM

## 2017-10-10 NOTE — Telephone Encounter (Signed)
Pt informed of CT results per Sarah Groce, NP.  PT verbalized understanding.  Copy sent to PCP.  Order placed for 1 yr f/u CT.  

## 2017-10-12 ENCOUNTER — Telehealth: Payer: Self-pay | Admitting: *Deleted

## 2017-10-12 NOTE — Telephone Encounter (Signed)
Spoke with patient on the phone regarding a CT of the lungs done for a cancer screening through [facility] pulmonology.  When the results were called to him in the finding the term, "atherosclerotic calcification of the arterial vasculature" was very upsetting to him. He wanted Dr. [name] and Dr. Lia Foyer to know about the results.  I have Epic messaged both.  According to subject around the middle of September 2019 he will be seeing Dr. Andria Rhein, his PCP, and would go over the results with him.  His last appointment with Dr. [name] was in November of 2018.  I encouraged him to make sure he kept his appointment with Dr. Andria Rhein and discuss the CT findings with him. We also discussed him to call the cardiology office and make sure he has an appointment scheduled to see Dr. [name] for his annual follow up.

## 2017-10-20 ENCOUNTER — Telehealth: Payer: Self-pay | Admitting: Cardiovascular Disease

## 2017-10-20 DIAGNOSIS — R079 Chest pain, unspecified: Secondary | ICD-10-CM

## 2017-10-20 NOTE — Telephone Encounter (Signed)
Called patient back. Informed patient that Dr. [name] is not in the office, but would send a message to him to see if he can check his CT next week when he is back. Informed patient that with his history of CAD that calcium buildup would be expected. Patient stated at this time he is in a clinical trial for cholesterol, and he is not sure what he is taking for his cholesterol. Will send to Dr. [name] for further advisement and see if he will look at patient most recent CT (10/02/17). Patient verbalized understanding.

## 2017-10-20 NOTE — Telephone Encounter (Signed)
New Message   Patient is calling because he recently had a CT and was told that he had calcium buildup in one of his major arteries. He is not sure if Dr. [name] wants him to be seen or not. But he would like for the provider to look at the CT and advise. Please call.

## 2017-10-22 NOTE — Telephone Encounter (Signed)
He's had CABG we know he has CAD and is in trial for cholesterol. If no chest pain ok

## 2017-10-23 NOTE — Telephone Encounter (Signed)
Called patient back with Dr. Kyla Balzarine recommendations. Patient stated he has sharp chest discomfort every once in awhile in the middle of his chest, then it just goes away. This has been going on for about a year, and patient has been thinking it is just indigestion.  Will forward to Dr. [name].

## 2017-10-23 NOTE — Telephone Encounter (Signed)
Can order lexiscan myovue with new scanner at our office since he has had CABG and last myovue 2014

## 2017-10-23 NOTE — Telephone Encounter (Signed)
Called patient back about Dr. Kyla Balzarine recommendations. Patient verbalized understanding. Went over instructions for lexiscan. Will send message to scheduling to call patient to make an appointment.

## 2017-10-24 ENCOUNTER — Telehealth (HOSPITAL_COMMUNITY): Payer: Self-pay | Admitting: *Deleted

## 2017-10-24 NOTE — Telephone Encounter (Signed)
Patient given detailed instructions per Myocardial Perfusion Study Information Sheet for the test on 10/30/17 at 1015. Patient notified to arrive 15 minutes early and that it is imperative to arrive on time for appointment to keep from having the test rescheduled.  If you need to cancel or reschedule your appointment, please call the office within 24 hours of your appointment. . Patient verbalized understanding.Tenelle Andreason, Ranae Palms

## 2017-10-27 ENCOUNTER — Ambulatory Visit (INDEPENDENT_AMBULATORY_CARE_PROVIDER_SITE_OTHER): Payer: Medicare Other | Admitting: Pulmonary Disease

## 2017-10-27 ENCOUNTER — Encounter: Payer: Self-pay | Admitting: Pulmonary Disease

## 2017-10-27 VITALS — BP 142/78 | HR 98 | Ht 61.81 in | Wt 190.4 lb

## 2017-10-27 DIAGNOSIS — J9611 Chronic respiratory failure with hypoxia: Secondary | ICD-10-CM | POA: Diagnosis not present

## 2017-10-27 DIAGNOSIS — G4733 Obstructive sleep apnea (adult) (pediatric): Secondary | ICD-10-CM

## 2017-10-27 DIAGNOSIS — F17210 Nicotine dependence, cigarettes, uncomplicated: Secondary | ICD-10-CM | POA: Diagnosis not present

## 2017-10-27 DIAGNOSIS — J432 Centrilobular emphysema: Secondary | ICD-10-CM

## 2017-10-27 MED ORDER — FORMOTEROL FUMARATE 20 MCG/2ML IN NEBU: 20.0000 ug | INHALATION_SOLUTION | Freq: Two times a day (BID) | RESPIRATORY_TRACT | 0 refills | Status: DC

## 2017-10-27 NOTE — Patient Instructions (Signed)
Severe COPD: Stop smoking Exercise regularly Get a high-dose flu shot when available Keep taking budesonide twice a day Use Perforomist twice a day, call me if you think this is helpful and we will call in a prescription Stop using albuterol routinely, just use it as needed for shortness of breath  Tobacco abuse: Stop smoking Keep the follow-up appointment for an annual lung cancer screening CT scan  Obstructive sleep apnea: Keep using CPAP nightly No driving while sleepy  Follow-up with me in 3 to 4 months or sooner if needed

## 2017-10-27 NOTE — Progress Notes (Signed)
Subjective:    Patient ID: William Dyer, male    DOB: July 12, 1945, 72 y.o.   MRN: [id number]  Synopsis: [facility] paitent with COPD and a history of lung cacner. 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 Follow-up    increased SOB and wheezing    William Dyer comes back to clinic today saying that he has had more trouble breathing recently.  He says that last week he had to be seen for a COPD exacerbation by his primary care physician who treated him with erythromycin and prednisone.  He says that he starting to feel better now.  However, he notes that he has had a significant amount of anxiety and heart racing recently.  He thinks this may be related to the albuterol he has been taking.  He continues to use budesonide twice a day.  He continues to smoke cigarettes. Copd exac last week pred, eryth Stopped anoro, couldn't pee Ct scan results  Past Medical History:  Diagnosis Date  . CAD (coronary artery disease)    Remote PCI; s/p CABG x 3 in Feb 2013  . COPD (chronic obstructive pulmonary disease) ([place])   . Diabetes mellitus   . GERD (gastroesophageal reflux disease)   . Heart murmur   . Hyperlipidemia   . Hypothyroidism   . Lung cancer ([place]) 07/2000   status post right lower lobectomy for adenocarcinoma  . Obesity   . Pneumonia       Review of Systems  Constitutional: Negative for chills, fatigue and fever.  HENT: Negative for postnasal drip, rhinorrhea and sinus pressure.   Respiratory: Positive for shortness of breath. Negative for cough and wheezing.   Cardiovascular: Negative for chest pain, palpitations and leg swelling.       Objective:   Physical Exam  Vitals:   10/27/17 1543  BP: (!) 142/78    Pulse: 98  SpO2: 91%  Weight: 190 lb 6.4 oz (86.4 kg)  Height: 5' 1.81" (1.57 m)   RA   Gen: chronically ill appearing HENT: OP clear, TM's clear, neck supple PULM: Wheezing RLL B, normal percussion CV: RRR, no mgr, trace edema GI: BS+, soft, nontender Derm: no cyanosis or rash Psyche: normal mood and affect   CBC    Component Value Date/Time   WBC 16.2 (H) 05/15/2013 0404   RBC 3.84 (L) 05/15/2013 0404   HGB 12.1 (L) 05/15/2013 0404   HCT 35.6 (L) 05/15/2013 0404   PLT 223 05/15/2013 0404   MCV 92.7 05/15/2013 0404   MCH 31.5 05/15/2013 0404   MCHC 34.0 05/15/2013 0404   RDW 14.4 05/15/2013 0404   LYMPHSABS 1.5 05/12/2013 2249   MONOABS 1.6 (H) 05/12/2013 2249   EOSABS 0.0 05/12/2013 2249   BASOSABS 0.0 05/12/2013 2249    CPAP compliance report: February 15 - April 22, 2017 showed use 30 out of 30 days greater than 40%, mean pressure 17 cm of water  PFT: PFT's 2009:  FEV1 1.73 (63%), ratio 55, DLCO 50% March 2017 pulmonary function testing: Ratio 57%, FEV1 1.26 L (50% predicted, 22% change with bronchodilator which is greater than 200 mL), FVC 2.20 L (64% predicted), DLCO 13.2 (54% predicted).    Assessment & Plan:  Moderate smoker (20 or less per day)  Centrilobular emphysema (HCC)  Chronic respiratory failure with hypoxia (HCC)  OSA (obstructive sleep apnea)  Discussion: William Dyer is here for his routine 16-month visit where he tells me that the medicines I am giving him are doing no good for him.  He continues to smoke cigarettes.  I explained to him today that he has severe COPD and he desperately needs to stop.  I explained to him today that we have tried every form of controller medicine for COPD and he has either complained that they do not work or that they cause some sort of problem.  I think that the anxiety he has experienced is related to the frequent albuterol use.  Plan: Severe COPD: Stop smoking Exercise regularly Get a high-dose flu shot  when available Keep taking budesonide twice a day Use Perforomist twice a day, call me if you think this is helpful and we will call in a prescription Stop using albuterol routinely, just use it as needed for shortness of breath  Tobacco abuse: Stop smoking Keep the follow-up appointment for an annual lung cancer screening CT scan  Obstructive sleep apnea: Keep using CPAP nightly No driving while sleepy  Follow-up with me in 3 to 4 months or sooner if needed     Current Outpatient Medications:  .  albuterol (PROAIR HFA) 108 (90 Base) MCG/ACT inhaler, Inhale 1-2 puffs into the lungs every 6 (six) hours as needed for wheezing or shortness of breath., Disp: 1 Inhaler, Rfl: 6 .  albuterol (PROVENTIL) (2.5 MG/3ML) 0.083% nebulizer solution, Take 3 mLs (2.5 mg total) by nebulization 2 (two) times daily., Disp: 360 mL, Rfl: 12 .  aspirin 81 MG tablet, Take 81 mg by mouth daily., Disp: , Rfl:  .  budesonide (PULMICORT) 0.25 MG/2ML nebulizer solution, Take 2 mLs (0.25 mg total) by nebulization 2 (two) times daily., Disp: 60 mL, Rfl: 11 .  fluticasone (FLONASE) 50 MCG/ACT nasal spray, Place 1 spray into both nostrils as needed for allergies or rhinitis. 1 spray each nostril every AM, Disp: , Rfl:  .  guaiFENesin (MUCINEX) 600 MG 12 hr tablet, Take 600 mg by mouth 2 (two) times daily as needed for to loosen phlegm. For congestion, Disp: , Rfl:  .  levothyroxine (SYNTHROID, LEVOTHROID) 137 MCG tablet, Take 137 mcg by mouth daily., Disp: , Rfl:  .  lisinopril-hydrochlorothiazide (PRINZIDE,ZESTORETIC) 10-12.5 MG per tablet, 1/2 tab by mouth once daily, Disp: , Rfl:  .  loratadine (CLARITIN) 10 MG tablet, Take 10 mg by mouth daily as needed for allergies or rhinitis. , Disp: , Rfl:  .  metFORMIN (GLUCOPHAGE) 1000 MG tablet, Take 1,000 mg by mouth 2 (two) times daily with a meal. , Disp: , Rfl:  .  nitroGLYCERIN (NITROSTAT) 0.4 MG SL tablet, Place 1 tablet (0.4 mg total) under the tongue every 5 (five)  minutes as needed for chest pain (3 doses max)., Disp: 25 tablet, Rfl: 3 .  senna-docusate (SENOKOT-S) 8.6-50 MG per tablet, Take 2 tablets by mouth 2 (two) times daily as needed for moderate constipation., Disp: 30 tablet, Rfl: 0 .  tamsulosin (FLOMAX) 0.4 MG CAPS capsule, Take 0.4 mg by mouth daily after supper. , Disp: , Rfl:  .  formoterol (PERFOROMIST) 20 MCG/2ML nebulizer solution, Take 2 mLs (20 mcg total) by nebulization 2 (two) times daily., Disp: 20 mL, Rfl: 0

## 2017-10-30 ENCOUNTER — Ambulatory Visit (HOSPITAL_COMMUNITY): Payer: Medicare Other | Attending: Cardiology

## 2017-10-30 DIAGNOSIS — E119 Type 2 diabetes mellitus without complications: Secondary | ICD-10-CM | POA: Insufficient documentation

## 2017-10-30 DIAGNOSIS — R0609 Other forms of dyspnea: Secondary | ICD-10-CM | POA: Insufficient documentation

## 2017-10-30 DIAGNOSIS — R079 Chest pain, unspecified: Secondary | ICD-10-CM | POA: Diagnosis present

## 2017-10-30 DIAGNOSIS — I4510 Unspecified right bundle-branch block: Secondary | ICD-10-CM | POA: Insufficient documentation

## 2017-10-30 DIAGNOSIS — I2584 Coronary atherosclerosis due to calcified coronary lesion: Secondary | ICD-10-CM | POA: Diagnosis not present

## 2017-10-30 DIAGNOSIS — R002 Palpitations: Secondary | ICD-10-CM | POA: Insufficient documentation

## 2017-10-30 DIAGNOSIS — I10 Essential (primary) hypertension: Secondary | ICD-10-CM | POA: Diagnosis not present

## 2017-10-30 DIAGNOSIS — I25119 Atherosclerotic heart disease of native coronary artery with unspecified angina pectoris: Secondary | ICD-10-CM | POA: Insufficient documentation

## 2017-10-30 LAB — MYOCARDIAL PERFUSION IMAGING
LHR: 0.31
LVDIAVOL: 104 mL (ref 62–150)
LVSYSVOL: 36 mL
NUC STRESS TID: 1.09
Peak HR: 101 {beats}/min
Rest HR: 73 {beats}/min
SDS: 4
SRS: 6
SSS: 10

## 2017-10-30 MED ORDER — TECHNETIUM TC 99M TETROFOSMIN IV KIT
10.2000 | PACK | Freq: Once | INTRAVENOUS | Status: AC | PRN
  Administered 2017-10-30: 10.2 via INTRAVENOUS
  Filled 2017-10-30: qty 11

## 2017-10-30 MED ORDER — TECHNETIUM TC 99M TETROFOSMIN IV KIT
31.9000 | PACK | Freq: Once | INTRAVENOUS | Status: AC | PRN
  Administered 2017-10-30: 31.9 via INTRAVENOUS
  Filled 2017-10-30: qty 32

## 2017-10-30 MED ORDER — REGADENOSON 0.4 MG/5ML IV SOLN
0.4000 mg | Freq: Once | INTRAVENOUS | Status: AC
  Administered 2017-10-30: 0.4 mg via INTRAVENOUS

## 2017-10-31 ENCOUNTER — Telehealth: Payer: Self-pay | Admitting: Pulmonary Disease

## 2017-10-31 ENCOUNTER — Other Ambulatory Visit: Payer: Self-pay | Admitting: Pulmonary Disease

## 2017-10-31 MED ORDER — FORMOTEROL FUMARATE 20 MCG/2ML IN NEBU: 20.0000 ug | INHALATION_SOLUTION | Freq: Two times a day (BID) | RESPIRATORY_TRACT | 0 refills | Status: DC

## 2017-10-31 NOTE — Telephone Encounter (Signed)
Patient called and stated that [name] is going to cost $200 for his copay. Called and spoke to pharmacy to see if they can run the medication under part B. Gave pharmacy tech J codes as well. Pharmacy tech stated to run as part B she needs more insurance information from patient.  Called and let patient know that the pharmacy needs a call from him but to give [place] a call back if the medication still isn't affordable.

## 2017-10-31 NOTE — Telephone Encounter (Signed)
Called and spoke to pt.  Pt is requesting Rx for perforomist to be sent to [facility], as he feels this medication is effective.  Pt also stated during our conversation, that his breathing feels more labored with exertion after using perforomist.  within 2-3hr symptoms subside per pt. Denied additional symptoms.  BQ please advise. thanks

## 2017-10-31 NOTE — Telephone Encounter (Signed)
Pt is aware of BQ's recommendations.  Pt has ben scheduled for OV on 11/06/17 with Aaron Edelman, as pt stated he can not come for OV this week. Rx for perforomist has been sent to preferred pharmacy. Nothing further is needed.

## 2017-10-31 NOTE — Telephone Encounter (Signed)
Let's refill the performist. I'd like for him to see an NP this week

## 2017-11-01 MED ORDER — FORMOTEROL FUMARATE 20 MCG/2ML IN NEBU: 20.0000 ug | INHALATION_SOLUTION | Freq: Two times a day (BID) | RESPIRATORY_TRACT | 0 refills | Status: DC

## 2017-11-01 NOTE — Telephone Encounter (Signed)
Spoke to pt, who states he can not afford perforomist, as this medication has over a 200 dollar copay.  I offered to send Rx to [place], pt declined as he feels co pay will be the same.  Pt requested two samples of perforomist to get him by until his apt with Aaron Edelman on 11/06/17. Nothing further is needed.

## 2017-11-01 NOTE — Telephone Encounter (Signed)
Patient is here in the lobby and wanted to speak to someone regarding getting some [name] samples.

## 2017-11-06 ENCOUNTER — Ambulatory Visit (INDEPENDENT_AMBULATORY_CARE_PROVIDER_SITE_OTHER): Payer: Medicare Other | Admitting: Pulmonary Disease

## 2017-11-06 ENCOUNTER — Encounter: Payer: Self-pay | Admitting: Pulmonary Disease

## 2017-11-06 DIAGNOSIS — J9611 Chronic respiratory failure with hypoxia: Secondary | ICD-10-CM | POA: Diagnosis not present

## 2017-11-06 DIAGNOSIS — Z720 Tobacco use: Secondary | ICD-10-CM

## 2017-11-06 DIAGNOSIS — F17210 Nicotine dependence, cigarettes, uncomplicated: Secondary | ICD-10-CM

## 2017-11-06 DIAGNOSIS — J432 Centrilobular emphysema: Secondary | ICD-10-CM | POA: Diagnosis not present

## 2017-11-06 DIAGNOSIS — G4733 Obstructive sleep apnea (adult) (pediatric): Secondary | ICD-10-CM

## 2017-11-06 MED ORDER — FORMOTEROL FUMARATE 20 MCG/2ML IN NEBU: 20.0000 ug | INHALATION_SOLUTION | Freq: Two times a day (BID) | RESPIRATORY_TRACT | 5 refills | Status: DC

## 2017-11-06 MED ORDER — BUDESONIDE-FORMOTEROL FUMARATE 160-4.5 MCG/ACT IN AERO: 2.0000 | INHALATION_SPRAY | Freq: Two times a day (BID) | RESPIRATORY_TRACT | 0 refills | Status: DC

## 2017-11-06 NOTE — Assessment & Plan Note (Signed)
Continue budesonide nebulized medication twice daily >>>Rinse your mouth out after use  Continue performist nebulized medication twice daily >>> Samples provided for you today >>> Prescription sent to [name]   Back-up plan:   Symbicort 160 >>> 2 puffs in the morning right when you wake up, rinse out your mouth after use, 12 hours later 2 puffs, rinse after use >>> Take this daily, no matter what >>> This is not a rescue inhaler   >>> If you have to start Symbicort 160 this replaces your Perforomist and budesonide nebulized solutions   We recommend that you stop smoking.   Smoking Cessation Resources:  1 800 QUIT NOW  >>> Patient to call this resource and utilize it to help support her quit smoking >>> Keep up your hard work with stopping smoking  You can also contact the [facility] >>>For smoking cessation classes call [phone]  We do not recommend using e-cigarettes as a form of stopping smoking   We recommend that you continue using your BiPAP daily >>>Keep up the hard work using your device >>> Goal should be wearing this for the entire night that you are sleeping, at least 4 to 6 hours  Follow-up with our office in 2 to 3 months

## 2017-11-06 NOTE — Progress Notes (Signed)
@Patient  ID: William Dyer, male    DOB: Jul 02, 1945, 72 y.o.   MRN: [id number]  Chief Complaint  Patient presents with  . Follow-up    COPD / OSA    Referring provider: Leota Jacobsen, MD  HPI:  72 year old male former smoker followed in our office for COPD, history of lung cancer (status post RLL lobectomy for adenocarcinoma), obstructive sleep apnea  PMH:  Smoker/ Smoking History: Active Smoker. 1ppd.  50 pack year smoking history.   Maintenance:  Budesonide / Perfomist   Pt of: Dr. Lake Bells, [facility]   Recent [facility] Encounters:   10/27/17 - Brooke Bonito  Plan: Stop smoking, exercise regularly, receive high-dose flu vaccine when available, keep taking budesonide twice a day, performist samples provided, if this is helpful let us know, continue using BiPAP, follow up in 3-4 months   10/31/17 - telephone encounter -patient reporting he cannot afford the performist $200 co-pay  11/06/2017  - Visit   72 year old patient seen for follow-up visit today.  Patient was recently seen on 10/27/2017 by Dr. Lake Bells.  Patient was started on performist in addition to his budesonide nebulized solutions.  Patient reports that he feels the performist has helped but the cost $200 to receive from his pharmacy.  Patient has had success with Anoro Ellipta as well as Spiriva in the past but stopped these due to anticholinergic effects.  Patient cannot take a LAMA.   Unfortunately the patient is still smoking 1 pack/day.   The patient BiPAP compliance report showing 30 out of 30 days use, all 30 of those days greater than 4 hours, average usage 8 hours and 38 minutes, of IPAP setting 16, EPAP setting 12.  AHI 0.  Patient reports that he has been having great compliance and having no issues wearing the device.  Patient reports that he absolutely needs to wear it in order to sleep well.   Tests:  03/09/2011-echocardiogram-LV ejection fraction 60 to 55%, grade 1 diastolic  dysfunction  7/32/2025-[place] chest lung cancer screening-lung RADS 2, emphysema, follow-up in 12 months  04/30/2015-pulmonary function test- FVC 1.85 (54% predicted), postbronchodilator ratio 57, FEV1 41, bronchodilator response, DLCO 53, concavity in flow volume loops  >>>Moderate airflow obstruction, restrictive lung disease  Chart Review:     Specialty Problems      Pulmonary Problems   Lung cancer ([place])    status post right lower lobectomy for adenocarcinoma      COPD (chronic obstructive pulmonary disease) ([place])    PFT's 2009:  FEV1 1.73 (63%), ratio 55, DLCO 50% Oxygen 1liter at HS only  Urinary issues with spiriva, can't afford tudorza Trial of spiriva respimat 08/2013 >> still with urinary retention issues March 2017 pulmonary function testing: Ratio 57%, FEV1 1.26 L (50% predicted, 22% change with bronchodilator which is greater than 200 mL), FVC 2.20 L (64% predicted), DLCO 13.2 (54% predicted).      COPD with acute exacerbation (HCC)   Chronic respiratory failure (HCC)   OSA (obstructive sleep apnea)    11/2014 Sleep study> AHI 5.17 March 2015 started on VPAP 16/12 with 2 L oxygen         Allergies  Allergen Reactions  . Rosuvastatin Calcium     Failed lipitor and Zocor due to muscle aches  . Statins     Failed lipitor and Zocor due to muscle aches  . Sulfonamide Derivatives Other (See Comments)    Patient "drasws up" muscularly    Immunization History  Administered  Date(s) Administered  . Influenza Split 10/25/2010, 11/08/2011, 10/22/2014  . Influenza Whole 11/07/2009  . Influenza, High Dose Seasonal PF 10/28/2015, 10/24/2016, 10/28/2017  . Influenza,inj,Quad PF,6+ Mos 11/07/2012  . Influenza-Unspecified 11/06/2013  . Pneumococcal Conjugate-13 11/06/2013  . Pneumococcal Polysaccharide-23 02/06/2007, 02/10/2012    Past Medical History:  Diagnosis Date  . CAD (coronary artery disease)    Remote PCI; s/p CABG x 3 in Feb 2013  . COPD (chronic  obstructive pulmonary disease) ([place])   . Diabetes mellitus   . GERD (gastroesophageal reflux disease)   . Heart murmur   . Hyperlipidemia   . Hypothyroidism   . Lung cancer ([place]) 07/2000   status post right lower lobectomy for adenocarcinoma  . Obesity   . Pneumonia     Tobacco History: Social History   Tobacco Use  Smoking Status Current Every Day Smoker  . Packs/day: 1.00  . Years: 50.00  . Pack years: 50.00  . Types: Cigarettes  . Last attempt to quit: 03/14/2011  . Years since quitting: 6.6  Smokeless Tobacco Never Used   Ready to quit: Yes Counseling given: Yes  Smoking assessment and cessation counseling  Patient currently smoking: 1 ppd  I have advised the patient to quit/stop smoking as soon as possible due to high risk for multiple medical problems.  It will also be very difficult for [place] to manage patient's  respiratory symptoms and status if we continue to expose her lungs to a known irritant.  We do not advise e-cigarettes as a form of stopping smoking.  Patient is willing to quit smoking.  I have advised the patient that we can assist and have options of nicotine replacement therapy, provided smoking cessation education today, provided smoking cessation counseling, and provided cessation resources.  Follow-up next office visit office visit for assessment of smoking cessation.  Smoking cessation counseling advised for: 4 min    Outpatient Encounter Medications as of 11/06/2017  Medication Sig  . albuterol (PROAIR HFA) 108 (90 Base) MCG/ACT inhaler Inhale 1-2 puffs into the lungs every 6 (six) hours as needed for wheezing or shortness of breath.  Marland Kitchen albuterol (PROVENTIL) (2.5 MG/3ML) 0.083% nebulizer solution Take 3 mLs (2.5 mg total) by nebulization 2 (two) times daily.  Marland Kitchen aspirin 81 MG tablet Take 81 mg by mouth daily.  . budesonide (PULMICORT) 0.25 MG/2ML nebulizer solution Take 2 mLs (0.25 mg total) by nebulization 2 (two) times daily.  . fluticasone  (FLONASE) 50 MCG/ACT nasal spray Place 1 spray into both nostrils as needed for allergies or rhinitis. 1 spray each nostril every AM  . guaiFENesin (MUCINEX) 600 MG 12 hr tablet Take 600 mg by mouth 2 (two) times daily as needed for to loosen phlegm. For congestion  . levothyroxine (SYNTHROID, LEVOTHROID) 137 MCG tablet Take 137 mcg by mouth daily.  Marland Kitchen lisinopril-hydrochlorothiazide (PRINZIDE,ZESTORETIC) 10-12.5 MG per tablet 1/2 tab by mouth once daily  . loratadine (CLARITIN) 10 MG tablet Take 10 mg by mouth daily as needed for allergies or rhinitis.   . metFORMIN (GLUCOPHAGE) 1000 MG tablet Take 1,000 mg by mouth 2 (two) times daily with a meal.   . nitroGLYCERIN (NITROSTAT) 0.4 MG SL tablet Place 1 tablet (0.4 mg total) under the tongue every 5 (five) minutes as needed for chest pain (3 doses max).  Marland Kitchen senna-docusate (SENOKOT-S) 8.6-50 MG per tablet Take 2 tablets by mouth 2 (two) times daily as needed for moderate constipation.  . tamsulosin (FLOMAX) 0.4 MG CAPS capsule Take 0.4 mg by mouth  daily after supper.   . [DISCONTINUED] formoterol (PERFOROMIST) 20 MCG/2ML nebulizer solution Take 2 mLs (20 mcg total) by nebulization 2 (two) times daily.  . [DISCONTINUED] PERFOROMIST 20 MCG/2ML nebulizer solution USE 1 VIAL IN NEBULIZER TWICE DAILY  . budesonide-formoterol (SYMBICORT) 160-4.5 MCG/ACT inhaler Inhale 2 puffs into the lungs 2 (two) times daily.  . formoterol (PERFOROMIST) 20 MCG/2ML nebulizer solution Take 2 mLs (20 mcg total) by nebulization 2 (two) times daily.   No facility-administered encounter medications on file as of 11/06/2017.      Review of Systems  Review of Systems  Constitutional: Positive for fatigue. Negative for activity change, chills, fever and unexpected weight change.  HENT: Negative for postnasal drip, rhinorrhea, sinus pressure, sinus pain, sneezing and sore throat.   Eyes: Negative.   Respiratory: Positive for cough and shortness of breath. Negative for wheezing.    Cardiovascular: Negative for chest pain and palpitations.  Gastrointestinal: Negative for constipation, diarrhea, nausea and vomiting.  Endocrine: Negative.   Musculoskeletal: Negative.   Skin: Negative.   Neurological: Negative for dizziness and headaches.  Psychiatric/Behavioral: Negative.  Negative for dysphoric mood. The patient is not nervous/anxious.   All other systems reviewed and are negative.    Physical Exam  BP 116/64 (BP Location: Left Arm, Cuff Size: Normal)   Pulse 78   Ht 5\' 2"  (1.575 m)   Wt 192 lb (87.1 kg)   SpO2 94%   BMI 35.12 kg/m   Wt Readings from Last 5 Encounters:  11/06/17 192 lb (87.1 kg)  10/30/17 196 lb (88.9 kg)  10/27/17 190 lb 6.4 oz (86.4 kg)  06/09/17 196 lb 12.8 oz (89.3 kg)  04/26/17 198 lb (89.8 kg)   2L pulsed via [place]  Physical Exam  Constitutional: He is oriented to person, place, and time and well-developed, well-nourished, and in no distress. No distress.  HENT:  Head: Normocephalic and atraumatic.  Right Ear: Hearing, tympanic membrane, external ear and ear canal normal.  Left Ear: Hearing, tympanic membrane, external ear and ear canal normal.  Nose: Nose normal. Right sinus exhibits no maxillary sinus tenderness and no frontal sinus tenderness. Left sinus exhibits no maxillary sinus tenderness and no frontal sinus tenderness.  Mouth/Throat: Uvula is midline and oropharynx is clear and moist. No oropharyngeal exudate.  Eyes: Pupils are equal, round, and reactive to light.  Neck: Normal range of motion. Neck supple. No JVD present.  Cardiovascular: Normal rate, regular rhythm and normal heart sounds.  Pulmonary/Chest: Effort normal. No accessory muscle usage. No respiratory distress. He has no decreased breath sounds. He has wheezes (slight exp wheeze ). He has no rhonchi.  Abdominal: Soft. Bowel sounds are normal. There is no tenderness.  Musculoskeletal: Normal range of motion. He exhibits no edema.  Lymphadenopathy:    He has  no cervical adenopathy.  Neurological: He is alert and oriented to person, place, and time. Gait normal.  Skin: Skin is warm and dry. He is not diaphoretic. No erythema.  Psychiatric: Mood, memory, affect and judgment normal.  Nursing note and vitals reviewed.    Lab Results:  CBC    Component Value Date/Time   WBC 16.2 (H) 05/15/2013 0404   RBC 3.84 (L) 05/15/2013 0404   HGB 12.1 (L) 05/15/2013 0404   HCT 35.6 (L) 05/15/2013 0404   PLT 223 05/15/2013 0404   MCV 92.7 05/15/2013 0404   MCH 31.5 05/15/2013 0404   MCHC 34.0 05/15/2013 0404   RDW 14.4 05/15/2013 0404   LYMPHSABS 1.5 05/12/2013  2249   MONOABS 1.6 (H) 05/12/2013 2249   EOSABS 0.0 05/12/2013 2249   BASOSABS 0.0 05/12/2013 2249    BMET    Component Value Date/Time   NA 133 (L) 04/11/2016 0915   K 4.1 04/11/2016 0915   CL 93 (L) 04/11/2016 0915   CO2 22 04/11/2016 0915   GLUCOSE 120 (H) 04/11/2016 0915   GLUCOSE 181 (H) 05/15/2013 0404   BUN 20 04/11/2016 0915   CREATININE 0.94 04/11/2016 0915   CALCIUM 9.4 04/11/2016 0915   GFRNONAA 81 04/11/2016 0915   GFRAA 94 04/11/2016 0915    BNP No results found for: BNP  ProBNP    Component Value Date/Time   PROBNP 166.2 (H) 05/13/2013 0052    Imaging: No results found.    Assessment & Plan:   72 year old patient seen today in office visit.  Provided patient performist  samples today.  We will also send prescription to [name].  Patient to continue budesonide.  Unfortunately this will take time for patient to be able to receive this performance nebulized solution.  In the meantime if patient runs out of performance before able to provide more samples or if he is able to receive the prescription I have provided a 2-week sample of Symbicort 160.  I have explained to the patient how to take it.  I have also explained to him that if he is to start taking Symbicort 160 he is to hold this budesonide as well as his performist.  I have emphasized the importance  that he stop smoking.  Patient agrees.  Patient reports that he will contact 1 800 quit now.  Patient to continue using BiPAP as prescribed.  Patient to follow-up with our office in 2 to 3 months, or sooner if having issues with medicaitons or breathing.  OSA (obstructive sleep apnea) We recommend that you continue using your CPAP daily >>>Keep up the hard work using your device >>> Goal should be wearing this for the entire night that you are sleeping, at least 4 to 6 hours  Remember:  . Do not drive or operate heavy machinery if tired or drowsy.  . Please notify the supply company and office if you are unable to use your device regularly due to missing supplies or machine being broken.  . Work on maintaining a healthy weight and following your recommended nutrition plan  . Maintain proper daily exercise and movement  . Maintaining proper use of your device can also help improve management of other chronic illnesses such as: Blood pressure, blood sugars, and weight management.   BiPAP/ CPAP Cleaning:  >>>Clean weekly, with Dawn soap, and bottle brush.  Set up to air dry.    COPD (chronic obstructive pulmonary disease) (HCC) Continue budesonide nebulized medication twice daily >>>Rinse your mouth out after use  Continue performist nebulized medication twice daily >>> Samples provided for you today >>> Prescription sent to [name]   Back-up plan:   Symbicort 160 >>> 2 puffs in the morning right when you wake up, rinse out your mouth after use, 12 hours later 2 puffs, rinse after use >>> Take this daily, no matter what >>> This is not a rescue inhaler   >>> If you have to start Symbicort 160 this replaces your Perforomist and budesonide nebulized solutions   We recommend that you stop smoking.   Smoking Cessation Resources:  1 800 QUIT NOW  >>> Patient to call this resource and utilize it to help support her quit smoking >>>  Keep up your hard work with stopping  smoking  You can also contact the [facility] >>>For smoking cessation classes call [phone]  We do not recommend using e-cigarettes as a form of stopping smoking   We recommend that you continue using your BiPAP daily >>>Keep up the hard work using your device >>> Goal should be wearing this for the entire night that you are sleeping, at least 4 to 6 hours  Follow-up with our office in 2 to 3 months   Chronic respiratory failure Continue oxygen therapy as prescribed If oxygen levels drop below 88% apply oxygen Contact our office if you have increased oxygen needs  Tobacco abuse We recommend that you stop smoking.   Smoking Cessation Resources:  1 800 QUIT NOW  >>> Patient to call this resource and utilize it to help support her quit smoking >>> Keep up your hard work with stopping smoking  You can also contact the [facility] >>>For smoking cessation classes call [phone]  We do not recommend using e-cigarettes as a form of stopping smoking         Lauraine Rinne, NP 11/06/2017

## 2017-11-06 NOTE — Patient Instructions (Addendum)
Continue budesonide nebulized medication twice daily >>>Rinse your mouth out after use  Continue performist nebulized medication twice daily >>> Samples provided for you today >>> Prescription sent to [name]   Back-up plan:   Symbicort 160 >>> 2 puffs in the morning right when you wake up, rinse out your mouth after use, 12 hours later 2 puffs, rinse after use >>> Take this daily, no matter what >>> This is not a rescue inhaler   >>> If you have to start Symbicort 160 this replaces your Perforomist and budesonide nebulized solutions   We recommend that you stop smoking.   Smoking Cessation Resources:  1 800 QUIT NOW  >>> Patient to call this resource and utilize it to help support her quit smoking >>> Keep up your hard work with stopping smoking  You can also contact the [facility] >>>For smoking cessation classes call [phone]  We do not recommend using e-cigarettes as a form of stopping smoking   We recommend that you continue using your BiPAP daily >>>Keep up the hard work using your device >>> Goal should be wearing this for the entire night that you are sleeping, at least 4 to 6 hours  Remember:  . Do not drive or operate heavy machinery if tired or drowsy.  . Please notify the supply company and office if you are unable to use your device regularly due to missing supplies or machine being broken.  . Work on maintaining a healthy weight and following your recommended nutrition plan  . Maintain proper daily exercise and movement  . Maintaining proper use of your device can also help improve management of other chronic illnesses such as: Blood pressure, blood sugars, and weight management.   BiPAP/ CPAP Cleaning:  >>>Clean weekly, with Dawn soap, and bottle brush.  Set up to air dry.    Follow-up with our office in 2 to 3 months   It is flu season:   >>>Remember to be washing your hands regularly, using hand sanitizer, be careful to use  around herself with has contact with people who are sick will increase her chances of getting sick yourself. >>> Best ways to protect herself from the flu: Receive the yearly flu vaccine, practice good hand hygiene washing with soap and also using hand sanitizer when available, eat a nutritious meals, get adequate rest, hydrate appropriately    As of 12/11/2017 we will be moving! We will no longer be at our [place] location.   Our new address and phone number will be:  [address] [place], [place] [address] Telephone number: [phone]   Please contact the office if your symptoms worsen or you have concerns that you are not improving.   Thank you for choosing [facility] for your healthcare, and for allowing [place] to partner with you on your healthcare journey. I am thankful to be able to provide care to you today.   Wyn Quaker FNP-C     Coping with Quitting Smoking Quitting smoking is a physical and mental challenge. You will face cravings, withdrawal symptoms, and temptation. Before quitting, work with your health care provider to make a plan that can help you cope. Preparation can help you quit and keep you from giving in. How can I cope with cravings? Cravings usually last for 5-10 minutes. If you get through it, the craving will pass. Consider taking the following actions to help you cope with cravings:  Keep your mouth busy: ? Chew sugar-free  gum. ? Suck on hard candies or a straw. ? Brush your teeth.  Keep your hands and body busy: ? Immediately change to a different activity when you feel a craving. ? Squeeze or play with a ball. ? Do an activity or a hobby, like making bead jewelry, practicing needlepoint, or working with wood. ? Mix up your normal routine. ? Take a short exercise break. Go for a quick walk or run up and down stairs. ? Spend time in public places where smoking is not allowed.  Focus on doing something kind or helpful for someone  else.  Call a friend or family member to talk during a craving.  Join a support group.  Call a quit line, such as 1-800-QUIT-NOW.  Talk with your health care provider about medicines that might help you cope with cravings and make quitting easier for you.  How can I deal with withdrawal symptoms? Your body may experience negative effects as it tries to get used to not having nicotine in the system. These effects are called withdrawal symptoms. They may include:  Feeling hungrier than normal.  Trouble concentrating.  Irritability.  Trouble sleeping.  Feeling depressed.  Restlessness and agitation.  Craving a cigarette.  To manage withdrawal symptoms:  Avoid places, people, and activities that trigger your cravings.  Remember why you want to quit.  Get plenty of sleep.  Avoid coffee and other caffeinated drinks. These may worsen some of your symptoms.  How can I handle social situations? Social situations can be difficult when you are quitting smoking, especially in the first few weeks. To manage this, you can:  Avoid parties, bars, and other social situations where people might be smoking.  Avoid alcohol.  Leave right away if you have the urge to smoke.  Explain to your family and friends that you are quitting smoking. Ask for understanding and support.  Plan activities with friends or family where smoking is not an option.  What are some ways I can cope with stress? Wanting to smoke may cause stress, and stress can make you want to smoke. Find ways to manage your stress. Relaxation techniques can help. For example:  Breathe slowly and deeply, in through your nose and out through your mouth.  Listen to soothing, relaxing music.  Talk with a family member or friend about your stress.  Light a candle.  Soak in a bath or take a shower.  Think about a peaceful place.  What are some ways I can prevent weight gain? Be aware that many people gain weight after  they quit smoking. However, not everyone does. To keep from gaining weight, have a plan in place before you quit and stick to the plan after you quit. Your plan should include:  Having healthy snacks. When you have a craving, it may help to: ? Eat plain popcorn, crunchy carrots, celery, or other cut vegetables. ? Chew sugar-free gum.  Changing how you eat: ? Eat small portion sizes at meals. ? Eat 4-6 small meals throughout the day instead of 1-2 large meals a day. ? Be mindful when you eat. Do not watch television or do other things that might distract you as you eat.  Exercising regularly: ? Make time to exercise each day. If you do not have time for a long workout, do short bouts of exercise for 5-10 minutes several times a day. ? Do some form of strengthening exercise, like weight lifting, and some form of aerobic exercise, like running or swimming.  Drinking plenty of water or other low-calorie or no-calorie drinks. Drink 6-8 glasses of water daily, or as much as instructed by your health care provider.  Summary  Quitting smoking is a physical and mental challenge. You will face cravings, withdrawal symptoms, and temptation to smoke again. Preparation can help you as you go through these challenges.  You can cope with cravings by keeping your mouth busy (such as by chewing gum), keeping your body and hands busy, and making calls to family, friends, or a helpline for people who want to quit smoking.  You can cope with withdrawal symptoms by avoiding places where people smoke, avoiding drinks with caffeine, and getting plenty of rest.  Ask your health care provider about the different ways to prevent weight gain, avoid stress, and handle social situations. This information is not intended to replace advice given to you by your health care provider. Make sure you discuss any questions you have with your health care provider. Document Released: 01/22/2016 Document Revised: 01/22/2016  Document Reviewed: 01/22/2016 Elsevier Interactive Patient Education  [employer].

## 2017-11-06 NOTE — Assessment & Plan Note (Signed)
We recommend that you stop smoking.   Smoking Cessation Resources:  1 800 QUIT NOW  >>> Patient to call this resource and utilize it to help support her quit smoking >>> Keep up your hard work with stopping smoking  You can also contact the [facility] >>>For smoking cessation classes call [phone]  We do not recommend using e-cigarettes as a form of stopping smoking

## 2017-11-06 NOTE — Assessment & Plan Note (Signed)
Continue oxygen therapy as prescribed If oxygen levels drop below 88% apply oxygen Contact our office if you have increased oxygen needs

## 2017-11-06 NOTE — Assessment & Plan Note (Signed)

## 2017-11-07 NOTE — Progress Notes (Signed)
Reviewed, agree 

## 2017-11-08 ENCOUNTER — Telehealth: Payer: Self-pay | Admitting: Pulmonary Disease

## 2017-11-08 NOTE — Telephone Encounter (Signed)
Called [name], faxed over information needed.   Called and spoke with patient and informed them we sent to [name] with all the information needed and will notify patient when we hear something.   Patient has one performist left and was instructed to call back tomorrow and see if there were samples delivered.   Nothing needed further.

## 2017-11-08 NOTE — Telephone Encounter (Signed)
Andris Flurry from [employer] [phone] is calling for [name]  She needs office notes for office visit on 09/30   [phone]

## 2017-11-08 NOTE — Telephone Encounter (Signed)
Attempted to contact pt. I did not receive an answer. There was no option for me to leave a message. Will try back.  

## 2017-11-16 ENCOUNTER — Telehealth: Payer: Self-pay | Admitting: Pulmonary Disease

## 2017-11-16 NOTE — Telephone Encounter (Signed)
Called and spoke with patient. He states he is wondering if he should just be on the symbicort 160 or does he need to have an RX for performist sent to the [facility].  Patient states he feels the symbicort seems to be working for him (started 11/08/2017).   Patient states [name] is expensive and was wondering if symbicort is also?  Dr. Lake Bells please advise.

## 2017-11-17 NOTE — Telephone Encounter (Signed)
Patient calling to check on status, CB is [phone]

## 2017-11-17 NOTE — Telephone Encounter (Signed)
Called and spoke with [name] at [employer]. She mentions patient has a medicare advantage plan. She also states she is going to call patient and see what financial state he is in to see how [name] can help the cost.   Called patient let patient know [name] would be calling him and if it still didn't work to call us back.  Patient still asking why he needs to be on the performist instead of the symbicort. Patient also states he will be out of his symbicort soon, so should be come get samples Monday to continue medication or not.  Will route to BQ as FYI. Will await patient's call back.

## 2017-11-17 NOTE — Telephone Encounter (Signed)
He needs to check to make sure that the perforomist is going through medicare part B

## 2017-11-20 ENCOUNTER — Telehealth: Payer: Self-pay | Admitting: Pulmonary Disease

## 2017-11-20 MED ORDER — FORMOTEROL FUMARATE 20 MCG/2ML IN NEBU: 20.0000 ug | INHALATION_SOLUTION | Freq: Two times a day (BID) | RESPIRATORY_TRACT | 5 refills | Status: DC

## 2017-11-20 MED ORDER — FORMOTEROL FUMARATE 20 MCG/2ML IN NEBU: 20.0000 ug | INHALATION_SOLUTION | Freq: Two times a day (BID) | RESPIRATORY_TRACT | 0 refills | Status: DC

## 2017-11-20 NOTE — Telephone Encounter (Signed)
Went to lobby to speak with patient. Patient states he needed symbicort 160. Patient really needed perforomist but cost is too high and waiting on [name] to contact patient back today or tomorrow. Patient wants an RX sent to [facility], as this is where he gets all of this other medications. RX for perforomist was sent to [employer]rative [employer] [place] to verify where to send RX. [facility] representative states needs to be a printed RX and signed and faxed. [phone]. RX printed for B.Mack to sign when he returns to office tomorrow 11/21/2017   Instructed patient to call back when he is half way through the sample to see if we have more. OR if he has not heard from [place] or [employer]. Patient has follow up in 01/2018 with BQ.   Will route to myself and bettie Denice Paradise as follow up

## 2017-11-21 NOTE — Telephone Encounter (Signed)
Wyn Quaker signed RX and it was faxed to  [place].   Nothing further needed

## 2017-11-21 NOTE — Telephone Encounter (Signed)
Attempted to contact pt. I did not receive an answer. There was no option for me to leave a message. Will try back.  

## 2017-11-22 NOTE — Telephone Encounter (Signed)
Attempted to call pt. I did not receive an answer. I have left a message for pt to return our call.  

## 2017-11-23 NOTE — Telephone Encounter (Signed)
Talked with [name] at [facility] and she states that patient is going to have RX sent to his house tomorrow (11/24/2017).  Nothing further needed, will route to [facility] as FYI

## 2017-11-23 NOTE — Telephone Encounter (Signed)
Thank you for your hard work on this.  This will be a big help to the patient  We will route to [facility] Pickup as well as Dr. Lake Bells to keep them updated as this is the Dr. Lake Bells patient.  Wyn Quaker, FNP

## 2017-11-23 NOTE — Telephone Encounter (Signed)
Attempted to call pt. I did not receive an answer. I have left a message for pt to return our call.  

## 2017-11-23 NOTE — Telephone Encounter (Signed)
Noted, thanks!

## 2017-11-24 NOTE — Telephone Encounter (Signed)
We have attempted to contact pt several times with no success or call back from pt. Per triage protocol, message will be closed.  

## 2017-12-05 ENCOUNTER — Encounter: Payer: Medicare Other | Admitting: *Deleted

## 2017-12-05 DIAGNOSIS — Z006 Encounter for examination for normal comparison and control in clinical research program: Secondary | ICD-10-CM

## 2017-12-06 NOTE — Research (Signed)
Subject to research clinic for visit T10M24 in the [employer] trial.  No cos, aes or saes to report.  Compliant with meds and new drug dispensed.  Next phone call and visit scheduled.

## 2017-12-19 ENCOUNTER — Telehealth: Payer: Self-pay | Admitting: Pulmonary Disease

## 2017-12-19 NOTE — Telephone Encounter (Signed)
Pt is aware to take Pulmicort neb BID. Nothing more needed at this time.

## 2018-01-11 NOTE — Progress Notes (Signed)
Patient ID: William Dyer, male   DOB: [birth year], 72 y.o.   MRN: [id number]   This is a 72 y.o. male patient who has a history of coronary artery disease status post CABG x3 in February 2013.  PVT  SVG Diagonal SVG:  RCA LIMA: LAD   Myovue done 10/30/17 for atypical sharp chest pain low risk diaphragmatic attenuation EF 65% no RWMA  Enrolled in CLEAR trial for lipids   COPD  seeing [facility] now Using nebulizer  Lung cancer screening CT 10/02/17  ok moderate to severe centrilobular emphysema  No cardiac complaints Exertional dyspnea from COPD  ROS: Denies fever, malais, weight loss, blurry vision, decreased visual acuity, cough, sputum, SOB, hemoptysis, pleuritic pain, palpitaitons, heartburn, abdominal pain, melena, lower extremity edema, claudication, or rash.  All other systems reviewed and negative  General: BP 120/70   Pulse 79   Ht 5\' 2"  (1.575 m)   Wt 195 lb 8 oz (88.7 kg)   SpO2 91%   BMI 35.76 kg/m    Affect appropriate Chronically ill obese white male  Affect appropriate HEENT: normal Neck supple with no adenopathy JVP normal no bruits no thyromegaly Lungs chronic rhonchi and exp wheezing and good diaphragmatic motion Heart:  S1/S2 no murmur, no rub, gallop or click PMI normal Abdomen: benighn, BS positve, no tenderness, no AAA no bruit.  No HSM or HJR Distal pulses intact with no bruits No edema Neuro non-focal Skin warm and dry No muscular weakness   Current Outpatient Medications  Medication Sig Dispense Refill  . albuterol (PROAIR HFA) 108 (90 Base) MCG/ACT inhaler Inhale 1-2 puffs into the lungs every 6 (six) hours as needed for wheezing or shortness of breath. 1 Inhaler 6  . albuterol (PROVENTIL) (2.5 MG/3ML) 0.083% nebulizer solution Take 3 mLs (2.5 mg total) by nebulization 2 (two) times daily. 360 mL 12  . aspirin 81 MG tablet Take 81 mg by mouth daily.    . formoterol (PERFOROMIST) 20 MCG/2ML nebulizer solution Take 2 mLs (20 mcg total) by  nebulization 2 (two) times daily. 2 mL 0  . formoterol (PERFOROMIST) 20 MCG/2ML nebulizer solution Take 2 mLs (20 mcg total) by nebulization 2 (two) times daily. 120 mL 5  . guaiFENesin (MUCINEX) 600 MG 12 hr tablet Take 600 mg by mouth 2 (two) times daily as needed for to loosen phlegm. For congestion    . levothyroxine (SYNTHROID, LEVOTHROID) 137 MCG tablet Take 137 mcg by mouth daily.    Marland Kitchen lisinopril-hydrochlorothiazide (PRINZIDE,ZESTORETIC) 10-12.5 MG per tablet 1/2 tab by mouth once daily    . loratadine (CLARITIN) 10 MG tablet Take 10 mg by mouth daily as needed for allergies or rhinitis.     . metFORMIN (GLUCOPHAGE) 1000 MG tablet Take 1,000 mg by mouth 2 (two) times daily with a meal.     . nitroGLYCERIN (NITROSTAT) 0.4 MG SL tablet Place 1 tablet (0.4 mg total) under the tongue every 5 (five) minutes as needed for chest pain (3 doses max). 25 tablet 3  . senna-docusate (SENOKOT-S) 8.6-50 MG per tablet Take 2 tablets by mouth 2 (two) times daily as needed for moderate constipation. 30 tablet 0  . tamsulosin (FLOMAX) 0.4 MG CAPS capsule Take 0.4 mg by mouth daily after supper.      No current facility-administered medications for this visit.     Allergies  Rosuvastatin calcium; Statins; and Sulfonamide derivatives  Electrocardiogram:  ST rate 108 otherwise normal  01/21/14  09/03/14  SR rate 84  RBBB  RBBB new since previous 01/05/17 SR rate 86 RBBB LPFB   Assessment and Plan  CAD: CABG 2013 Non ischemic myovue 10/30/17 EF 65% no RWMA;s continue medical Rx   Chol: Praluant too expensive in CLEAR research trial now LDL 107 f/u [place]  DM: Discussed low carb diet.  Target hemoglobin A1c is 6.5 or less.  Continue current medications.  COPD:  Nebulizer helps   F/U [facility] CT ok no nodules/ cancer   Thyroid   On synthroid replacement labs with primary   RBBB  Likely related to lung disease f/u ECG yearly      [employer]

## 2018-01-12 ENCOUNTER — Encounter (INDEPENDENT_AMBULATORY_CARE_PROVIDER_SITE_OTHER): Payer: Self-pay

## 2018-01-12 ENCOUNTER — Encounter: Payer: Self-pay | Admitting: Cardiovascular Disease

## 2018-01-12 ENCOUNTER — Ambulatory Visit: Payer: Medicare Other | Admitting: Cardiovascular Disease

## 2018-01-12 VITALS — BP 120/70 | HR 79 | Ht 62.0 in | Wt 195.5 lb

## 2018-01-12 DIAGNOSIS — I2510 Atherosclerotic heart disease of native coronary artery without angina pectoris: Secondary | ICD-10-CM | POA: Diagnosis not present

## 2018-01-12 DIAGNOSIS — E782 Mixed hyperlipidemia: Secondary | ICD-10-CM

## 2018-01-12 DIAGNOSIS — I2583 Coronary atherosclerosis due to lipid rich plaque: Secondary | ICD-10-CM

## 2018-01-12 NOTE — Patient Instructions (Signed)

## 2018-01-26 ENCOUNTER — Ambulatory Visit (INDEPENDENT_AMBULATORY_CARE_PROVIDER_SITE_OTHER): Payer: Medicare Other | Admitting: Pulmonary Disease

## 2018-01-26 ENCOUNTER — Encounter: Payer: Self-pay | Admitting: Pulmonary Disease

## 2018-01-26 VITALS — BP 134/66 | HR 84 | Ht 62.0 in | Wt 199.0 lb

## 2018-01-26 DIAGNOSIS — J432 Centrilobular emphysema: Secondary | ICD-10-CM

## 2018-01-26 DIAGNOSIS — J9611 Chronic respiratory failure with hypoxia: Secondary | ICD-10-CM

## 2018-01-26 DIAGNOSIS — Z720 Tobacco use: Secondary | ICD-10-CM | POA: Diagnosis not present

## 2018-01-26 DIAGNOSIS — G4733 Obstructive sleep apnea (adult) (pediatric): Secondary | ICD-10-CM | POA: Diagnosis not present

## 2018-01-26 MED ORDER — FLUCONAZOLE 100 MG PO TABS: 100.0000 mg | ORAL_TABLET | Freq: Every day | ORAL | 0 refills | Status: DC

## 2018-01-26 NOTE — Patient Instructions (Signed)
Severe COPD ongoing exacerbation: Take prednisone 20 mg daily x5 Keep taking Perforomist twice a day Keep taking budesonide twice a day Use albuterol nebulized every 4 hours as needed for shortness of breath, chest tightness or wheezing Practice good hand hygiene Stay active  Obstructive sleep apnea: Use CPAP with oxygen at night  Oral thrush: Take Diflucan x3 days Keep using the nystatin rinse you have at home  Cigarette smoking: Quit smoking on January 2 as you have planned with the quit line  We will see you back in 4 to 6 weeks or sooner if needed

## 2018-01-26 NOTE — Progress Notes (Signed)
Subjective:    Patient ID: William Dyer, male    DOB: 1945-03-21, 72 y.o.   MRN: [id number]  Synopsis: Former [place] with COPD and a history of lung cancer. RLL Lobectomy 2001 at [facility] > adenocarcinoma Urinary issues with spiriva and Anoro, can't afford tudorza Trial of spiriva respimat 08/2013 >> still with urinary retention issues 2017 started BIPAP 16/12 with 2 L O2  Still smoking cigarettes as of September 2019     HPI Chief Complaint  Patient presents with  . Follow-up    recently hospitalized for CAP.  pt c/o sob with exertion, fatigue.     William Dyer was admitted to [facility] for a COPD exacerbation and required IV steroids and antibiotics.  He was treated with cefdinir as well.  He has been dealing with a lot of mouth sores since he came home.  He was given some nystatin mouth wash.  He is trying really hard to quit smoking.  He was treated with oxygen while he was in the hospital as well.  He had hyponatremia as well.  He was discharged on perforomist and pulmicort.    He continues to take perforomist and budesonide.    Past Medical History:  Diagnosis Date  . CAD (coronary artery disease)    Remote PCI; s/p CABG x 3 in Feb 2013  . COPD (chronic obstructive pulmonary disease) ([place])   . Diabetes mellitus   . GERD (gastroesophageal reflux disease)   . Heart murmur   . Hyperlipidemia   . Hypothyroidism   . Lung cancer ([place]) 07/2000   status post right lower lobectomy for adenocarcinoma  . Obesity   . Pneumonia       Review of Systems  Constitutional: Negative for chills, fatigue and fever.  HENT: Negative for postnasal drip, rhinorrhea and sinus pressure.   Respiratory: Positive for shortness of breath. Negative for cough and wheezing.   Cardiovascular: Negative for chest pain, palpitations and leg swelling.       Objective:   Physical Exam  Vitals:   01/26/18 1410  BP: 134/66  Pulse: 84  SpO2: 94%  Weight: 199 lb (90.3 kg)  Height: 5\' 2"  (1.575 m)    RA   Gen: chronically ill appearing HENT: Thrush noted on hard palate, TM's clear, neck supple PULM: Wheezes upper lobes, some crackles bases, normal percussion CV: RRR, no mgr, trace edema GI: BS+, soft, nontender Derm: no cyanosis or rash Psyche: normal mood and affect   CBC    Component Value Date/Time   WBC 16.2 (H) 05/15/2013 0404   RBC 3.84 (L) 05/15/2013 0404   HGB 12.1 (L) 05/15/2013 0404   HCT 35.6 (L) 05/15/2013 0404   PLT 223 05/15/2013 0404   MCV 92.7 05/15/2013 0404   MCH 31.5 05/15/2013 0404   MCHC 34.0 05/15/2013 0404   RDW 14.4 05/15/2013 0404   LYMPHSABS 1.5 05/12/2013 2249   MONOABS 1.6 (H) 05/12/2013 2249   EOSABS 0.0 05/12/2013 2249   BASOSABS 0.0 05/12/2013 2249    CPAP compliance report: February 15 - April 22, 2017 showed use 30 out of 30 days greater than 40%, mean pressure 17 cm of water  PFT: PFT's 2009:  FEV1 1.73 (63%), ratio 55, DLCO 50% March 2017 pulmonary function testing: Ratio 57%, FEV1 1.26 L (50% predicted, 22% change with bronchodilator which is greater than 200 mL), FVC 2.20 L (64% predicted), DLCO 13.2 (54% predicted).    Assessment & Plan:   Centrilobular emphysema ([place])  OSA (obstructive sleep apnea)  Chronic respiratory failure with hypoxia (HCC)  Tobacco abuse  Discussion: This has been a difficult interval for William Dyer as he was hospitalized for a week with a COPD exacerbation and is now dealing with thrush and ongoing wheezing and chest congestion.  He desperately needs to quit smoking.  We talked about the importance of this today.  He has significant thrush in his mouth.  Severe COPD ongoing exacerbation: Take prednisone 20 mg daily x5 Keep taking Perforomist twice a day Keep taking budesonide twice a day Use albuterol nebulized every 4 hours as needed for shortness of breath, chest tightness or wheezing Practice good hand hygiene Stay active  Obstructive sleep apnea: Use CPAP with oxygen at night  Oral  thrush: Take Diflucan x3 days Keep using the nystatin rinse you have at home  Cigarette smoking: Quit smoking on January 2 as you have planned with the quit line  We will see you back in 4 to 6 weeks or sooner if needed  > 50% of this 25 min visit spent face to face   Current Outpatient Medications:  .  albuterol (PROAIR HFA) 108 (90 Base) MCG/ACT inhaler, Inhale 1-2 puffs into the lungs every 6 (six) hours as needed for wheezing or shortness of breath., Disp: 1 Inhaler, Rfl: 6 .  albuterol (PROVENTIL) (2.5 MG/3ML) 0.083% nebulizer solution, Take 3 mLs (2.5 mg total) by nebulization 2 (two) times daily., Disp: 360 mL, Rfl: 12 .  aspirin 81 MG tablet, Take 81 mg by mouth daily., Disp: , Rfl:  .  formoterol (PERFOROMIST) 20 MCG/2ML nebulizer solution, Take 2 mLs (20 mcg total) by nebulization 2 (two) times daily., Disp: 2 mL, Rfl: 0 .  formoterol (PERFOROMIST) 20 MCG/2ML nebulizer solution, Take 2 mLs (20 mcg total) by nebulization 2 (two) times daily., Disp: 120 mL, Rfl: 5 .  guaiFENesin (MUCINEX) 600 MG 12 hr tablet, Take 600 mg by mouth 2 (two) times daily as needed for to loosen phlegm. For congestion, Disp: , Rfl:  .  levothyroxine (SYNTHROID, LEVOTHROID) 137 MCG tablet, Take 137 mcg by mouth daily., Disp: , Rfl:  .  lisinopril-hydrochlorothiazide (PRINZIDE,ZESTORETIC) 10-12.5 MG per tablet, 1/2 tab by mouth once daily, Disp: , Rfl:  .  loratadine (CLARITIN) 10 MG tablet, Take 10 mg by mouth daily as needed for allergies or rhinitis. , Disp: , Rfl:  .  metFORMIN (GLUCOPHAGE) 1000 MG tablet, Take 1,000 mg by mouth 2 (two) times daily with a meal. , Disp: , Rfl:  .  nitroGLYCERIN (NITROSTAT) 0.4 MG SL tablet, Place 1 tablet (0.4 mg total) under the tongue every 5 (five) minutes as needed for chest pain (3 doses max)., Disp: 25 tablet, Rfl: 3 .  senna-docusate (SENOKOT-S) 8.6-50 MG per tablet, Take 2 tablets by mouth 2 (two) times daily as needed for moderate constipation., Disp: 30 tablet,  Rfl: 0 .  tamsulosin (FLOMAX) 0.4 MG CAPS capsule, Take 0.4 mg by mouth daily after supper. , Disp: , Rfl:  .  fluconazole (DIFLUCAN) 100 MG tablet, Take 1 tablet (100 mg total) by mouth daily., Disp: 3 tablet, Rfl: 0

## 2018-03-09 ENCOUNTER — Encounter: Payer: Self-pay | Admitting: Nurse Practitioner

## 2018-03-09 ENCOUNTER — Ambulatory Visit: Payer: Medicare Other | Admitting: Nurse Practitioner

## 2018-03-09 DIAGNOSIS — J432 Centrilobular emphysema: Secondary | ICD-10-CM

## 2018-03-09 MED ORDER — FLUTICASONE-UMECLIDIN-VILANT 100-62.5-25 MCG/INH IN AEPB: 1.0000 | INHALATION_SPRAY | Freq: Every day | RESPIRATORY_TRACT | 0 refills | Status: DC

## 2018-03-09 NOTE — Patient Instructions (Addendum)
Continue albuterol as needed Will trial trelegy per patient request - samples given - 1 puff qday May stop perforomist and budesonide  Please keep follow up appointment with PCP as scheduled  Follow up: Please follow up at Dr. Anastasia Pall 1st available in around 4 weeks Please call sooner if needed

## 2018-03-09 NOTE — Progress Notes (Signed)
@Patient  ID: William Dyer, male    DOB: [birth year], 73 y.o.   MRN: [id number]  Chief Complaint  Patient presents with  . Follow-up    Referring provider: Leota Jacobsen, MD  HPI 73 year old male former smoker with COPD, history of lung cancer (status post RLL lobectomy for adenocarcinoma), and obstructive sleep apnea who is followed by Dr. Lake Bells.  PMH:  Smoker/ Smoking History: Active Smoker. 1ppd.  50 pack year smoking history.   Maintenance:  Budesonide / Perfomist    Tests: 03/09/2011-echocardiogram-LV ejection fraction 60 to 70%, grade 1 diastolic dysfunction  3/50/0938-[facility] chest lung cancer screening-lung RADS 2, emphysema, follow-up in 12 months  04/30/2015-pulmonary function test- FVC 1.85 (54% predicted), postbronchodilator ratio 57, FEV1 41, bronchodilator response, DLCO 53, concavity in flow volume loops  >>>Moderate airflow obstruction, restrictive lung disease  OV 03/09/18 - Follow up Patient was last seen by Dr. Lake Bells on 01/26/2018.  Is here for a follow-up appointment today.  States that he has quit smoking since his last visit.  He has been doing well since his last visit.  He states that he would like to switch from Perforomist and budesonide to an inhaler.  Even though he is doing well he feels that these 2 nebulizers do not work.  He states that he still gets short of breath at times.  He has tried multiple inhalers but unfortunately had adverse side effects with each.  He was tested with peak flow device in office today and does have the inspiratory volume to be able to trial Trelegy.  He denies any recent fevers.  He denies any chest pain or edema.  Patient does have a new PCP and states that his PCP has helped him to recover from recent COPD exacerbation.  PCP is with [facility].    Allergies  Allergen Reactions  . Rosuvastatin Calcium     Failed lipitor and Zocor due to muscle aches  . Statins     Failed lipitor and Zocor due to muscle aches  . Sulfonamide  Derivatives Other (See Comments)    Patient "drasws up" muscularly    Immunization History  Administered Date(s) Administered  . Influenza Split 10/25/2010, 11/08/2011, 10/22/2014  . Influenza Whole 11/07/2009  . Influenza, High Dose Seasonal PF 10/28/2015, 10/24/2016, 10/28/2017  . Influenza,inj,Quad PF,6+ Mos 11/07/2012  . Influenza-Unspecified 11/06/2013  . Pneumococcal Conjugate-13 11/06/2013  . Pneumococcal Polysaccharide-23 02/06/2007, 02/10/2012    Past Medical History:  Diagnosis Date  . CAD (coronary artery disease)    Remote PCI; s/p CABG x 3 in Feb 2013  . COPD (chronic obstructive pulmonary disease) ([place])   . Diabetes mellitus   . GERD (gastroesophageal reflux disease)   . Heart murmur   . Hyperlipidemia   . Hypothyroidism   . Lung cancer ([place]) 07/2000   status post right lower lobectomy for adenocarcinoma  . Obesity   . Pneumonia     Tobacco History: Social History   Tobacco Use  Smoking Status Current Every Day Smoker  . Packs/day: 1.00  . Years: 50.00  . Pack years: 50.00  . Types: Cigarettes  . Last attempt to quit: 03/14/2011  . Years since quitting: 7.0  Smokeless Tobacco Never Used   Ready to quit: Not Answered Counseling given: Not Answered   Outpatient Encounter Medications as of 03/09/2018  Medication Sig  . albuterol (PROAIR HFA) 108 (90 Base) MCG/ACT inhaler Inhale 1-2 puffs into the lungs every 6 (six) hours as needed for wheezing or shortness of breath.  Marland Kitchen  albuterol (PROVENTIL) (2.5 MG/3ML) 0.083% nebulizer solution Take 3 mLs (2.5 mg total) by nebulization 2 (two) times daily.  Marland Kitchen aspirin 81 MG tablet Take 81 mg by mouth daily.  . fluconazole (DIFLUCAN) 100 MG tablet Take 1 tablet (100 mg total) by mouth daily.  . formoterol (PERFOROMIST) 20 MCG/2ML nebulizer solution Take 2 mLs (20 mcg total) by nebulization 2 (two) times daily.  Marland Kitchen guaiFENesin (MUCINEX) 600 MG 12 hr tablet Take 600 mg by mouth 2 (two) times daily as needed for to loosen  phlegm. For congestion  . levothyroxine (SYNTHROID, LEVOTHROID) 137 MCG tablet Take 137 mcg by mouth daily.  Marland Kitchen lisinopril-hydrochlorothiazide (PRINZIDE,ZESTORETIC) 10-12.5 MG per tablet 1/2 tab by mouth once daily  . loratadine (CLARITIN) 10 MG tablet Take 10 mg by mouth daily as needed for allergies or rhinitis.   . metFORMIN (GLUCOPHAGE) 1000 MG tablet Take 1,000 mg by mouth 2 (two) times daily with a meal.   . nitroGLYCERIN (NITROSTAT) 0.4 MG SL tablet Place 1 tablet (0.4 mg total) under the tongue every 5 (five) minutes as needed for chest pain (3 doses max).  . tamsulosin (FLOMAX) 0.4 MG CAPS capsule Take 0.4 mg by mouth daily after supper.   . [DISCONTINUED] formoterol (PERFOROMIST) 20 MCG/2ML nebulizer solution Take 2 mLs (20 mcg total) by nebulization 2 (two) times daily.  . [DISCONTINUED] senna-docusate (SENOKOT-S) 8.6-50 MG per tablet Take 2 tablets by mouth 2 (two) times daily as needed for moderate constipation.  . [DISCONTINUED] Fluticasone-Umeclidin-Vilant (TRELEGY ELLIPTA) 100-62.5-25 MCG/INH AEPB Inhale 1 puff into the lungs daily.   No facility-administered encounter medications on file as of 03/09/2018.      Review of Systems  Review of Systems  Constitutional: Negative.  Negative for chills and fever.  HENT: Negative.   Respiratory: Positive for shortness of breath. Negative for cough and wheezing.   Cardiovascular: Negative.  Negative for chest pain, palpitations and leg swelling.  Gastrointestinal: Negative.   Allergic/Immunologic: Negative.   Neurological: Negative.   Psychiatric/Behavioral: Negative.        Physical Exam  BP 124/60 (BP Location: Left Arm, Cuff Size: Normal)   Pulse 85   Ht 5\' 2"  (1.575 m)   Wt 197 lb 12.8 oz (89.7 kg)   SpO2 (!) 86%   BMI 36.18 kg/m   Wt Readings from Last 5 Encounters:  03/09/18 197 lb 12.8 oz (89.7 kg)  01/26/18 199 lb (90.3 kg)  01/12/18 195 lb 8 oz (88.7 kg)  11/06/17 192 lb (87.1 kg)  10/30/17 196 lb (88.9 kg)       Physical Exam Vitals signs and nursing note reviewed.  Constitutional:      General: He is not in acute distress.    Appearance: He is well-developed.  Cardiovascular:     Rate and Rhythm: Normal rate and regular rhythm.  Pulmonary:     Effort: Pulmonary effort is normal. No respiratory distress.     Breath sounds: Normal breath sounds. No wheezing or rhonchi.  Skin:    General: Skin is warm and dry.  Neurological:     Mental Status: He is alert and oriented to person, place, and time.       Assessment & Plan:   COPD (chronic obstructive pulmonary disease) ([place]) Patient is much improved since last visit.  We will trial Trelegy.  Patient was instructed on how to use device.  Patient states that he has quit smoking.  Patient Instructions  Continue albuterol as needed Will trial trelegy per patient request -  samples given - 1 puff qday May stop perforomist and budesonide  Please keep follow up appointment with PCP as scheduled  Follow up: Please follow up at Dr. Anastasia Pall 1st available in around 4 weeks Please call sooner if needed       Fenton Foy, NP 03/12/2018

## 2018-03-12 ENCOUNTER — Encounter: Payer: Self-pay | Admitting: Nurse Practitioner

## 2018-03-12 ENCOUNTER — Telehealth: Payer: Self-pay | Admitting: Pulmonary Disease

## 2018-03-12 NOTE — Telephone Encounter (Signed)
Please call to let patient know that he can go back to the neb treatments he was using before (perforomist and budesonide).

## 2018-03-12 NOTE — Assessment & Plan Note (Signed)
Patient is much improved since last visit.  We will trial Trelegy.  Patient was instructed on how to use device.  Patient states that he has quit smoking.  Patient Instructions  Continue albuterol as needed Will trial trelegy per patient request - samples given - 1 puff qday May stop perforomist and budesonide  Please keep follow up appointment with PCP as scheduled  Follow up: Please follow up at Dr. Anastasia Pall 1st available in around 4 weeks Please call sooner if needed

## 2018-03-12 NOTE — Telephone Encounter (Signed)
Primary Pulmonologist: BQ Last office visit and with whom: 03/09/18 with [place] What do we see them for (pulmonary problems): EMPHYSEMA  Last OV assessment/plan: Trial of trelegy Was appointment offered to patient (explain)?  yes   Reason for call: pt states he was started on Trelegy at 03/09/18 office visit. Pt states since starting Trelegy his blood sugar has been all over the place. Pt states yesterday prior to eating it was 122 and last night is was 192. Pt states gulose was 166 this morning. Pt states his blood sugars are typically stable between 109-126. Pt currently taking Metformin 1000mg  BID. Pt is requesting recommendations. Pt would like to resume perforomist & budesonide.   (examples of things to ask: : When did symptoms start? Fever? Cough? Productive? Color to sputum? More sputum than usual? Wheezing? Have you needed increased oxygen? Are you taking your respiratory medications? What over the counter measures have you tried?)

## 2018-03-12 NOTE — Telephone Encounter (Signed)
Spoke with patient. He is aware to go back to using his neb treatments before switching to Trelegy. He verbalized understanding. Stated that he did not need refills on his neb solutions.   Nothing further needed at time of call.

## 2018-03-13 NOTE — Progress Notes (Signed)
Reviewed, agree 

## 2018-03-30 ENCOUNTER — Telehealth: Payer: Self-pay | Admitting: *Deleted

## 2018-03-30 NOTE — Telephone Encounter (Signed)
Subject contacted for T11-M27 in the Clear research study.  Next appt verified.

## 2018-04-16 ENCOUNTER — Ambulatory Visit (INDEPENDENT_AMBULATORY_CARE_PROVIDER_SITE_OTHER)
Admission: RE | Admit: 2018-04-16 | Discharge: 2018-04-16 | Disposition: A | Discharge status: Home / Self Care | Payer: Medicare Other | Source: Ambulatory Visit | Attending: Pulmonary Disease | Admitting: Pulmonary Disease

## 2018-04-16 ENCOUNTER — Encounter: Payer: Self-pay | Admitting: Pulmonary Disease

## 2018-04-16 ENCOUNTER — Ambulatory Visit (INDEPENDENT_AMBULATORY_CARE_PROVIDER_SITE_OTHER): Payer: Medicare Other | Admitting: Pulmonary Disease

## 2018-04-16 VITALS — BP 120/70 | HR 72 | Ht 62.0 in | Wt 201.4 lb

## 2018-04-16 DIAGNOSIS — J432 Centrilobular emphysema: Secondary | ICD-10-CM

## 2018-04-16 DIAGNOSIS — G4733 Obstructive sleep apnea (adult) (pediatric): Secondary | ICD-10-CM | POA: Diagnosis not present

## 2018-04-16 DIAGNOSIS — Z720 Tobacco use: Secondary | ICD-10-CM | POA: Diagnosis not present

## 2018-04-16 DIAGNOSIS — J9611 Chronic respiratory failure with hypoxia: Secondary | ICD-10-CM

## 2018-04-16 MED ORDER — AZITHROMYCIN 250 MG PO TABS: 250.0000 mg | ORAL_TABLET | Freq: Every day | ORAL | 1 refills | Status: DC

## 2018-04-16 NOTE — Progress Notes (Signed)
Subjective:    Patient ID: William Dyer, male    DOB: Jan 04, 1946, 73 y.o.   MRN: [id number]  Synopsis: Former [place] with COPD and a history of lung cancer. RLL Lobectomy 2001 at [facility] > adenocarcinoma Urinary issues with spiriva and Anoro, can't afford tudorza Trial of spiriva respimat 08/2013 >> still with urinary retention issues 2017 started BIPAP 16/12 with 2 L O2  Quit smoking in early 2020     HPI Chief Complaint  Patient presents with  . Follow-up    4wk f/u. States his breathing has been ok since last visit.     William Dyer says taht he nearly died but he is a lot better than he was. He says that in the mornings he has a lot of chest congestion, creamy colored, thick mucus.  He says that it comes up fairly easily.   He was hospitalized for pneumonia and COPD.  He says that he ws in there for 3.5 days. He was treated with prednisone and levaquin. He says that he has been really sick with his blood sugar.  He has not smoked a cigarettes in 72 days.  His oxygen has dropped to 79% recently with exertion.    Past Medical History:  Diagnosis Date  . CAD (coronary artery disease)    Remote PCI; s/p CABG x 3 in Feb 2013  . COPD (chronic obstructive pulmonary disease) ([place])   . Diabetes mellitus   . GERD (gastroesophageal reflux disease)   . Heart murmur   . Hyperlipidemia   . Hypothyroidism   . Lung cancer ([place]) 07/2000   status post right lower lobectomy for adenocarcinoma  . Obesity   . Pneumonia       Review of Systems  Constitutional: Negative for chills, fatigue and fever.  HENT: Negative for postnasal drip, rhinorrhea and sinus pressure.   Respiratory: Positive for shortness of breath. Negative for cough and wheezing.   Cardiovascular: Negative for chest pain, palpitations and leg swelling.       Objective:   Physical Exam  Vitals:   04/16/18 1130  BP: 120/70  Pulse: 72  SpO2: 94%  Weight: 201 lb 6.4 oz (91.4 kg)  Height: 5\' 2"  (1.575 m)   RA    Gen: chronically ill appearing HENT: OP clear, TM's clear, neck supple PULM: Crackles R base more than left B, normal percussion CV: RRR, no mgr, trace edema GI: BS+, soft, nontender Derm: no cyanosis or rash Psyche: normal mood and affect    CBC    Component Value Date/Time   WBC 16.2 (H) 05/15/2013 0404   RBC 3.84 (L) 05/15/2013 0404   HGB 12.1 (L) 05/15/2013 0404   HCT 35.6 (L) 05/15/2013 0404   PLT 223 05/15/2013 0404   MCV 92.7 05/15/2013 0404   MCH 31.5 05/15/2013 0404   MCHC 34.0 05/15/2013 0404   RDW 14.4 05/15/2013 0404   LYMPHSABS 1.5 05/12/2013 2249   MONOABS 1.6 (H) 05/12/2013 2249   EOSABS 0.0 05/12/2013 2249   BASOSABS 0.0 05/12/2013 2249   Chest imaging: January 2020 chest x-ray from [facility] showed a right lower lobe infiltrate.  CPAP compliance report: February 15 - April 22, 2017 showed use 30 out of 30 days greater than 40%, mean pressure 17 cm of water  PFT: PFT's 2009:  FEV1 1.73 (63%), ratio 55, DLCO 50% March 2017 pulmonary function testing: Ratio 57%, FEV1 1.26 L (50% predicted, 22% change with bronchodilator which is greater than 200 mL), FVC  2.20 L (64% predicted), DLCO 13.2 (54% predicted).    Assessment & Plan:   Centrilobular emphysema (HCC)  OSA (obstructive sleep apnea)  Chronic respiratory failure with hypoxia (HCC)  Tobacco abuse  Discussion: He has struggled lately because of a recent episode of pneumonia.  His most recent chest x-ray in January showed an infiltrate in the right lower lobe.  He still producing mucus in the mornings.  He has had multiple exacerbations in the last year which is due primarily to his ongoing tobacco use, fortunately he has finally quit.  Plan: COPD with recurrent exacerbations: Start taking azithromycin 250 mg daily We will plan on treating you with this medicine for 90 days Return in 6 weeks so that we can check a liver function test as well as a an EKG to make sure there is no problems with  this medicine Continue taking Perforomist twice a day Continue taking budesonide twice a day Practice good hand hygiene Stay active  Recent pneumonia: We will repeat a chest x-ray today  We will see you back in 6 weeks with a nurse practitioner or sooner if needed   Current Outpatient Medications:  .  albuterol (PROAIR HFA) 108 (90 Base) MCG/ACT inhaler, Inhale 1-2 puffs into the lungs every 6 (six) hours as needed for wheezing or shortness of breath., Disp: 1 Inhaler, Rfl: 6 .  albuterol (PROVENTIL) (2.5 MG/3ML) 0.083% nebulizer solution, Take 3 mLs (2.5 mg total) by nebulization 2 (two) times daily., Disp: 360 mL, Rfl: 12 .  aspirin 81 MG tablet, Take 81 mg by mouth daily., Disp: , Rfl:  .  formoterol (PERFOROMIST) 20 MCG/2ML nebulizer solution, Take 2 mLs (20 mcg total) by nebulization 2 (two) times daily., Disp: 120 mL, Rfl: 5 .  guaiFENesin (MUCINEX) 600 MG 12 hr tablet, Take 600 mg by mouth 2 (two) times daily as needed for to loosen phlegm. For congestion, Disp: , Rfl:  .  levothyroxine (SYNTHROID, LEVOTHROID) 137 MCG tablet, Take 137 mcg by mouth daily., Disp: , Rfl:  .  lisinopril-hydrochlorothiazide (PRINZIDE,ZESTORETIC) 10-12.5 MG per tablet, 1/2 tab by mouth once daily, Disp: , Rfl:  .  loratadine (CLARITIN) 10 MG tablet, Take 10 mg by mouth daily as needed for allergies or rhinitis. , Disp: , Rfl:  .  metFORMIN (GLUCOPHAGE) 1000 MG tablet, Take 1,000 mg by mouth 2 (two) times daily with a meal. , Disp: , Rfl:  .  nitroGLYCERIN (NITROSTAT) 0.4 MG SL tablet, Place 1 tablet (0.4 mg total) under the tongue every 5 (five) minutes as needed for chest pain (3 doses max)., Disp: 25 tablet, Rfl: 3 .  tamsulosin (FLOMAX) 0.4 MG CAPS capsule, Take 0.4 mg by mouth daily after supper. , Disp: , Rfl:  .  Vitamin D, Ergocalciferol, (DRISDOL) 1.25 MG (50000 UT) CAPS capsule, Take 50,000 Units by mouth once a week., Disp: , Rfl:

## 2018-04-16 NOTE — Patient Instructions (Signed)
COPD with recurrent exacerbations: Start taking azithromycin 250 mg daily We will plan on treating you with this medicine for 90 days Return in 6 weeks so that we can check a liver function test as well as a an EKG to make sure there is no problems with this medicine Continue taking Perforomist twice a day Continue taking budesonide twice a day Practice good hand hygiene Stay active  Recent pneumonia: We will repeat a chest x-ray today  We will see you back in 6 weeks with a nurse practitioner or sooner if needed

## 2018-04-17 ENCOUNTER — Telehealth: Payer: Self-pay | Admitting: Pulmonary Disease

## 2018-04-17 NOTE — Telephone Encounter (Signed)
Notes recorded by Juanito Doom, MD on 04/17/2018 at 9:47 AM EDT A, Please let the patient know this showed he has emphysema but the pneumonia has cleared up. Thanks, B   Called and spoke with pt letting him know the results of the cxr. Pt expressed understanding and asked if he still needed to continue taking the azithromycin that had been prescribed by BQ yesterday, 04/16/2018 since the pna has cleared up. Dr. Lake Bells, please advise on this for pt if he does need to still take the abx. Thanks!

## 2018-04-17 NOTE — Telephone Encounter (Signed)
yes

## 2018-04-17 NOTE — Telephone Encounter (Signed)
Called and spoke with patient he is aware and verbalized understanding nothing further needed.

## 2018-05-22 ENCOUNTER — Telehealth: Payer: Self-pay | Admitting: Pulmonary Disease

## 2018-05-22 NOTE — Telephone Encounter (Signed)
He is to take it every day

## 2018-05-22 NOTE — Telephone Encounter (Signed)
Call returned to patient, made aware he should be taking the azithromycin daily. Patient repeatedly stated why would he have to take it daily if BQ only placed 1 refill on the medication. I explained that per the last office visits notes he is to take it daily and that I had a nurse practitioner to confirm. I explained to patient that we can call in more refills and refills do not dictate whether a patient should continue a medication or note. This is addressed on a case by case basis. Voiced understanding. Nothing further is needed at this time.

## 2018-05-22 NOTE — Telephone Encounter (Signed)
Call returned to patient, he states at his last office visit he was instructed to take Azithromycin 250mg  daily. He states he completed the 30 day supply but wants to know if he is supposed to continue this daily. Per last AVS Azithromycin 250mg  daily. Pt states per the conversation at his office visit he does not think he wanted him to continue this long term.   Beth please advise. Thanks.

## 2018-05-28 ENCOUNTER — Ambulatory Visit: Payer: Medicare Other | Admitting: Adult Health

## 2018-06-01 ENCOUNTER — Telehealth: Payer: Self-pay | Admitting: *Deleted

## 2018-06-01 NOTE — Telephone Encounter (Signed)
Spoke with subject for visit T12-M30 in the Clear research trial.  No cos, aes or saes to report.  Subject to continue taking study drug until next visit. After next clinic visit he says he feels he may have given enough time to the study.  Will give me a definite answer next time he comes to the clinic.  Is agreeable to continuing in study off medication.  Gave verbal consent to have study IP delivered to home by [facility].  Will have next follow up phone call in 3 months.

## 2018-06-21 ENCOUNTER — Telehealth: Payer: Self-pay | Admitting: Pulmonary Disease

## 2018-06-21 MED ORDER — AZITHROMYCIN 250 MG PO TABS: 250.0000 mg | ORAL_TABLET | Freq: Every day | ORAL | 0 refills | Status: DC

## 2018-06-21 NOTE — Telephone Encounter (Signed)
Called and spoke with Patient. Patient stated he only as 2 azithromycin tabs left and if he is to remain on it, he needed a refill. Last prescription was 04/16/18, #30 with 1 refill, and per BQ instructions, patient needed to remain on it 90 days. Explained instructions for 90 days.  Patient stated understanding.  Azithromycin 250mg  #30 refill sent to requested pharmacy. Nothing further at this time.  04/16/18 BQ instructions-  Instructions      Return in about 6 weeks (around 05/28/2018).  COPD with recurrent exacerbations: Start taking azithromycin 250 mg daily We will plan on treating you with this medicine for 90 days Return in 6 weeks so that we can check a liver function test as well as a an EKG to make sure there is no problems with this medicine Continue taking Perforomist twice a day Continue taking budesonide twice a day Practice good hand hygiene Stay active  Recent pneumonia: We will repeat a chest x-ray today  We will see you back in 6 weeks with a nurse practitioner or sooner if needed

## 2018-07-17 ENCOUNTER — Telehealth: Payer: Self-pay

## 2018-07-17 NOTE — Telephone Encounter (Signed)
Virtual Visit Pre-Appointment Phone Call  "(Name), I am calling you today to discuss your upcoming appointment. We are currently trying to limit exposure to the virus that causes COVID-19 by seeing patients at home rather than in the office."  1. "What is the BEST phone number to call the day of the visit?" - include this in appointment notes  2. "Do you have or have access to (through a family member/friend) a smartphone with video capability that we can use for your visit?" a. If yes - list this number in appt notes as "cell" (if different from BEST phone #) and list the appointment type as a VIDEO visit in appointment notes b. If no - list the appointment type as a PHONE visit in appointment notes  3. Confirm consent - "In the setting of the current Covid19 crisis, you are scheduled for a (phone or video) visit with your provider on (date) at (time).  Just as we do with many in-office visits, in order for you to participate in this visit, we must obtain consent.  If you'd like, I can send this to your mychart (if signed up) or email for you to review.  Otherwise, I can obtain your verbal consent now.  All virtual visits are billed to your insurance company just like a normal visit would be.  By agreeing to a virtual visit, we'd like you to understand that the technology does not allow for your provider to perform an examination, and thus may limit your provider's ability to fully assess your condition. If your provider identifies any concerns that need to be evaluated in person, we will make arrangements to do so.  Finally, though the technology is pretty good, we cannot assure that it will always work on either your or our end, and in the setting of a video visit, we may have to convert it to a phone-only visit.  In either situation, we cannot ensure that we have a secure connection.  Are you willing to proceed? Yes  4. Advise patient to be prepared - "Two hours prior to your appointment, go  ahead and check your blood pressure, pulse, oxygen saturation, and your weight (if you have the equipment to check those) and write them all down. When your visit starts, your provider will ask you for this information. If you have an Apple Watch or Kardia device, please plan to have heart rate information ready on the day of your appointment. Please have a pen and paper handy nearby the day of the visit as well."  5. Give patient instructions for MyChart download to smartphone OR [email or web address] as below if video visit (depending on what platform provider is using)  6. Inform patient they will receive a phone call 15 minutes prior to their appointment time (may be from unknown caller ID) so they should be prepared to answer    TELEPHONE CALL NOTE  William Dyer has been deemed a candidate for a follow-up tele-health visit to limit community exposure during the Covid-19 pandemic. I spoke with the patient via phone to ensure availability of phone/video source, confirm preferred email & phone number, and discuss instructions and expectations.  I reminded William Dyer to be prepared with any vital sign and/or heart rhythm information that could potentially be obtained via home monitoring, at the time of his visit. I reminded William Dyer to expect a phone call prior to his visit.  Michaelyn Barter, RN 07/17/2018 2:59 PM  FULL LENGTH CONSENT FOR TELE-HEALTH VISIT   I hereby voluntarily request, consent and authorize [facility] and its employed or contracted physicians, physician assistants, nurse practitioners or other licensed health care professionals (the Practitioner), to provide me with telemedicine health care services (the "Services") as deemed necessary by the treating Practitioner. I acknowledge and consent to receive the Services by the Practitioner via telemedicine. I understand that the telemedicine visit will involve communicating with the Practitioner through live  audiovisual communication technology and the disclosure of certain medical information by electronic transmission. I acknowledge that I have been given the opportunity to request an in-person assessment or other available alternative prior to the telemedicine visit and am voluntarily participating in the telemedicine visit.  I understand that I have the right to withhold or withdraw my consent to the use of telemedicine in the course of my care at any time, without affecting my right to future care or treatment, and that the Practitioner or I may terminate the telemedicine visit at any time. I understand that I have the right to inspect all information obtained and/or recorded in the course of the telemedicine visit and may receive copies of available information for a reasonable fee.  I understand that some of the potential risks of receiving the Services via telemedicine include:  Marland Kitchen Delay or interruption in medical evaluation due to technological equipment failure or disruption; . Information transmitted may not be sufficient (e.g. poor resolution of images) to allow for appropriate medical decision making by the Practitioner; and/or  . In rare instances, security protocols could fail, causing a breach of personal health information.  Furthermore, I acknowledge that it is my responsibility to provide information about my medical history, conditions and care that is complete and accurate to the best of my ability. I acknowledge that Practitioner's advice, recommendations, and/or decision may be based on factors not within their control, such as incomplete or inaccurate data provided by me or distortions of diagnostic images or specimens that may result from electronic transmissions. I understand that the practice of medicine is not an exact science and that Practitioner makes no warranties or guarantees regarding treatment outcomes. I acknowledge that I will receive a copy of this consent concurrently upon  execution via email to the email address I last provided but may also request a printed copy by calling the office of [place].    I understand that my insurance will be billed for this visit.   I have read or had this consent read to me. . I understand the contents of this consent, which adequately explains the benefits and risks of the Services being provided via telemedicine.  . I have been provided ample opportunity to ask questions regarding this consent and the Services and have had my questions answered to my satisfaction. . I give my informed consent for the services to be provided through the use of telemedicine in my medical care  By participating in this telemedicine visit I agree to the above.

## 2018-07-19 ENCOUNTER — Telehealth: Payer: Self-pay | Admitting: Pulmonary Disease

## 2018-07-19 MED ORDER — NYSTATIN 100000 UNIT/ML MT SUSP: 5.0000 mL | Freq: Four times a day (QID) | OROMUCOSAL | 0 refills | Status: DC

## 2018-07-19 MED ORDER — NYSTATIN 100000 UNIT/GM EX CREA: 1.0000 "application " | TOPICAL_CREAM | Freq: Two times a day (BID) | CUTANEOUS | 0 refills | Status: DC

## 2018-07-19 NOTE — Telephone Encounter (Signed)
Pt aware He will call back to schedule. He really wants to see BQ. Explained not sure when he will return. Nothing further needed.

## 2018-07-19 NOTE — Telephone Encounter (Signed)
Called and spoke with pt who states he has developed thrush in his mouth and also a yeast infection due to being on the antibiotics. Pt is requesting to have something called in for the thrush and yeast infection. Tonya, please advise on this for pt. Thanks!

## 2018-07-19 NOTE — Telephone Encounter (Signed)
I can send him in some medication. Where is the yeast rash located? Thanks.

## 2018-07-19 NOTE — Telephone Encounter (Signed)
I order nystatin suspension to use after each meal and at bedtime for thrush. I also ordered nystatin cream for yeast rash. Thanks.

## 2018-07-19 NOTE — Telephone Encounter (Signed)
FYI: pt states BQ gave him 90 days of Azith and he has 5 days left in which he is not going to finish. He says his mouth is on fire.

## 2018-07-19 NOTE — Telephone Encounter (Signed)
That is ok. He will need a follow up appointment in with the provider he saw last in around 2-3 weeks. Thanks.

## 2018-07-19 NOTE — Progress Notes (Signed)
Virtual Visit via Telephone Note   This visit type was conducted due to national recommendations for restrictions regarding the COVID-19 Pandemic (e.g. social distancing) in an effort to limit this patient's exposure and mitigate transmission in our community.  Due to his co-morbid illnesses, this patient is at least at moderate risk for complications without adequate follow up.  This format is felt to be most appropriate for this patient at this time.  The patient did not have access to video technology/had technical difficulties with video requiring transitioning to audio format only (telephone).  All issues noted in this document were discussed and addressed.  No physical exam could be performed with this format.  Please refer to the patient's chart for his  consent to telehealth for [facility].   Date:  07/23/2018   ID:  William Dyer, DOB Feb 28, 1945, MRN [id number]  Patient Location: Home Provider Location: Office  PCP:  Alwyn Ren, MD  Cardiologist:   [name] Electrophysiologist:  None   Evaluation Performed:  Follow-Up Visit  Chief Complaint:  CAD/CABG  History of Present Illness:    73 y.o. history of CABG 2013 SVG to D1, SVG to RCA and LIMA to LAD. Significant COPD followed by Dr [place] Bells Still smoking Multiple pneumonias this spring with 6 week Rx course azithromycin Lung cancer screening CT negative for cancer but bad emphysema Last myovue done 10/30/17 EF 65% diaphragmatic attenuation with no ischemia low risk study  Not very active with COVID Got Thrush from long term erythromycin use No angina  Seeing lung doctor at [facility] as well Stopped smoking 6 months ago   The patient  does not have symptoms concerning for COVID-19 infection (fever, chills, cough, or new shortness of breath).    Past Medical History:  Diagnosis Date  . CAD (coronary artery disease)    Remote PCI; s/p CABG x 3 in Feb 2013  . COPD (chronic obstructive pulmonary disease) ([place])   . Diabetes  mellitus   . GERD (gastroesophageal reflux disease)   . Heart murmur   . Hyperlipidemia   . Hypothyroidism   . Lung cancer ([place]) 07/2000   status post right lower lobectomy for adenocarcinoma  . Obesity   . Pneumonia    Past Surgical History:  Procedure Laterality Date  . CORONARY ANGIOPLASTY  1990's  . CORONARY ARTERY BYPASS GRAFT  03/18/2011   Procedure: CORONARY ARTERY BYPASS GRAFTING (CABG);  Surgeon: [name] [name], MD;  Location: [place];  Service: Open Heart Surgery;  Laterality: N/A;  . INGUINAL HERNIA REPAIR  1960's   "? side"  . LUNG LOBECTOMY     right, lower; "for lung cancer"  . TONSILLECTOMY AND ADENOIDECTOMY  1950's     Current Meds  Medication Sig  . albuterol (PROAIR HFA) 108 (90 Base) MCG/ACT inhaler Inhale 1-2 puffs into the lungs every 6 (six) hours as needed for wheezing or shortness of breath.  Marland Kitchen aspirin 81 MG tablet Take 81 mg by mouth daily.  . formoterol (PERFOROMIST) 20 MCG/2ML nebulizer solution Take 2 mLs (20 mcg total) by nebulization 2 (two) times daily.  Marland Kitchen guaiFENesin (MUCINEX) 600 MG 12 hr tablet Take 600 mg by mouth 2 (two) times daily as needed for to loosen phlegm. For congestion  . levothyroxine (SYNTHROID, LEVOTHROID) 137 MCG tablet Take 137 mcg by mouth daily.  Marland Kitchen lisinopril-hydrochlorothiazide (PRINZIDE,ZESTORETIC) 10-12.5 MG per tablet 1/2 tab by mouth once daily  . loratadine (CLARITIN) 10 MG tablet Take 10 mg by mouth daily  as needed for allergies or rhinitis.   . metFORMIN (GLUCOPHAGE) 1000 MG tablet Take 1,000 mg by mouth 2 (two) times daily with a meal.   . nitroGLYCERIN (NITROSTAT) 0.4 MG SL tablet Place 1 tablet (0.4 mg total) under the tongue every 5 (five) minutes as needed for chest pain (3 doses max).  . nystatin (MYCOSTATIN) 100000 UNIT/ML suspension Take 5 mLs (500,000 Units total) by mouth 4 (four) times daily.  Marland Kitchen nystatin cream (MYCOSTATIN) Apply 1 application topically 2 (two) times daily.  . tamsulosin (FLOMAX) 0.4 MG CAPS  capsule Take 0.4 mg by mouth daily after supper.   . Vitamin D, Ergocalciferol, (DRISDOL) 1.25 MG (50000 UT) CAPS capsule Take 50,000 Units by mouth once a week.  . [DISCONTINUED] albuterol (PROVENTIL) (2.5 MG/3ML) 0.083% nebulizer solution Take 3 mLs (2.5 mg total) by nebulization 2 (two) times daily.     Allergies:   Rosuvastatin calcium, Statins, and Sulfonamide derivatives   Social History   Tobacco Use  . Smoking status: Former Smoker    Packs/day: 1.00    Years: 50.00    Pack years: 50.00    Types: Cigarettes    Quit date: 02/10/2018    Years since quitting: 0.4  . Smokeless tobacco: Never Used  Substance Use Topics  . Alcohol use: No    Alcohol/week: 0.0 standard drinks  . Drug use: No     Family Hx: The patient's family history includes Emphysema in his unknown relative; Lung cancer in his unknown relative.  ROS:   Please see the history of present illness.     All other systems reviewed and are negative.   Prior CV studies:   The following studies were reviewed today:  Myovue 10/30/17  Labs/Other Tests and Data Reviewed:    EKG:  01/05/17 SR RBBB LPFB   Recent Labs: No results found for requested labs within last 8760 hours.   Recent Lipid Panel Lab Results  Component Value Date/Time   CHOL 204 (H) 08/07/2015 08:51 AM   TRIG 183 (H) 08/07/2015 08:51 AM   HDL 60 08/07/2015 08:51 AM   CHOLHDL 3.4 08/07/2015 08:51 AM   LDLCALC 107 08/07/2015 08:51 AM   LDLDIRECT 72.0 03/31/2014 08:50 AM    Wt Readings from Last 3 Encounters:  07/23/18 94.3 kg  04/16/18 91.4 kg  03/09/18 89.7 kg     Objective:    Vital Signs:  BP 124/70   Pulse 82   Ht 5\' 2"  (1.575 m)   Wt 94.3 kg   SpO2 92%   BMI 38.04 kg/m    Skin warm and dry No distress No tachypnea No JVP elevation  Neuro appears non focal No edema  Telephone visit no exam   ASSESSMENT & PLAN:    CAD: CABG 2013 Non ischemic myovue 10/30/17 EF 65% no RWMA;s continue medical Rx   Chol: Praluant  too expensive in CLEAR research trial now LDL 107 f/u [place]  DM: Discussed low carb diet.  Target hemoglobin A1c is 6.5 or less.  Continue current medications.  COPD:  Nebulizer helps   F/U [place] CT ok no nodules/ cancer  Finally stopped smoking !!  Thyroid   On synthroid replacement labs with primary   RBBB  Likely related to lung disease f/u ECG yearly   COVID-19 Education: The signs and symptoms of COVID-19 were discussed with the patient and how to seek care for testing (follow up with PCP or arrange E-visit).  The importance of social distancing was discussed today.  Time:   Today, I have spent 30 minutes with the patient with telehealth technology discussing the above problems.     Medication Adjustments/Labs and Tests Ordered: Current medicines are reviewed at length with the patient today.  Concerns regarding medicines are outlined above.   Tests Ordered:  None   Medication Changes:  None   Disposition:  Follow up in 6 months   Signed, Jenkins Rouge, MD  07/23/2018 10:26 AM    [facility]

## 2018-07-19 NOTE — Telephone Encounter (Signed)
Returned call to patient for more info: Pt states rash in crotch area, and thrush in mouth. [place]

## 2018-07-23 ENCOUNTER — Encounter: Payer: Self-pay | Admitting: Cardiovascular Disease

## 2018-07-23 ENCOUNTER — Telehealth (INDEPENDENT_AMBULATORY_CARE_PROVIDER_SITE_OTHER): Payer: Medicare Other | Admitting: Cardiovascular Disease

## 2018-07-23 ENCOUNTER — Other Ambulatory Visit: Payer: Self-pay

## 2018-07-23 VITALS — BP 124/70 | HR 82 | Ht 62.0 in | Wt 208.0 lb

## 2018-07-23 DIAGNOSIS — Z951 Presence of aortocoronary bypass graft: Secondary | ICD-10-CM | POA: Diagnosis not present

## 2018-07-23 MED ORDER — NITROGLYCERIN 0.4 MG SL SUBL: 0.4000 mg | SUBLINGUAL_TABLET | SUBLINGUAL | 3 refills | Status: DC | PRN

## 2018-07-23 NOTE — Patient Instructions (Addendum)
Medication Instructions:   If you need a refill on your cardiac medications before your next appointment, please call your pharmacy.   Lab work:  If you have labs (blood work) drawn today and your tests are completely normal, you will receive your results only by: Marland Kitchen MyChart Message (if you have MyChart) OR . A paper copy in the mail If you have any lab test that is abnormal or we need to change your treatment, we will call you to review the results.  Testing/Procedures: None ordered today.  Follow-Up: At [facility], you and your health needs are our priority.  As part of our continuing mission to provide you with exceptional heart care, we have created designated Provider Care Teams.  These Care Teams include your primary Cardiologist (physician) and Advanced Practice Providers (APPs -  Physician Assistants and Nurse Practitioners) who all work together to provide you with the care you need, when you need it. You will need a follow up appointment in 6 months.  Please call our office 2 months in advance to schedule this appointment.  You may see Jenkins Rouge, MD or one of the following Advanced Practice Providers on your designated Care Team:   Truitt Merle, NP Cecilie Kicks, NP . Kathyrn Drown, NP  Your physician recommends that you schedule a follow-up appointment in: November with [facility].

## 2018-10-30 ENCOUNTER — Ambulatory Visit (INDEPENDENT_AMBULATORY_CARE_PROVIDER_SITE_OTHER)
Admission: RE | Admit: 2018-10-30 | Discharge: 2018-10-30 | Disposition: A | Discharge status: Home / Self Care | Payer: Medicare Other | Source: Ambulatory Visit | Attending: Acute Care | Admitting: Acute Care

## 2018-10-30 ENCOUNTER — Encounter (INDEPENDENT_AMBULATORY_CARE_PROVIDER_SITE_OTHER): Payer: Self-pay

## 2018-10-30 ENCOUNTER — Other Ambulatory Visit: Payer: Self-pay

## 2018-10-30 DIAGNOSIS — F17210 Nicotine dependence, cigarettes, uncomplicated: Secondary | ICD-10-CM

## 2018-10-30 DIAGNOSIS — Z122 Encounter for screening for malignant neoplasm of respiratory organs: Secondary | ICD-10-CM

## 2018-10-31 ENCOUNTER — Telehealth: Payer: Self-pay | Admitting: Acute Care

## 2018-10-31 NOTE — Telephone Encounter (Signed)
I will call the patient with the results. Thanks

## 2018-10-31 NOTE — Telephone Encounter (Signed)
Spoke with Opal Sidles and [facility] radiology  Calling to given call report of ct chest dated 10/30/18 Impression follows:  IMPRESSION: 1. Lung-RADS 4B, suspicious 9 mm nodule in the medial right lung, increased from 3 mm previously. Additional imaging evaluation or consultation with Pulmonology or Thoracic Surgery recommended. 2.  Emphysema. (ICD10-J43.9) 3.  Aortic Atherosclerois (ICD10-170.0) 4. Probable cholelithiasis.  These results will be called to the ordering clinician or representative by the Radiologist Assistant, and communication documented in the PACS or zVision Dashboard.   Electronically Signed   By: Misty Stanley M.D.   On: 10/30/2018 16:51

## 2018-11-02 ENCOUNTER — Telehealth: Payer: Self-pay

## 2018-11-02 NOTE — Telephone Encounter (Signed)
Per the result note, Judson Roch spoke with the pt about these results.

## 2018-11-02 NOTE — Telephone Encounter (Signed)
Appt made for 11/12/2018. Nothing further needed at this time.

## 2018-11-02 NOTE — Telephone Encounter (Signed)
-----   Message from Magdalen Spatz, NP sent at 11/01/2018  4:44 PM EDT ----- Regarding: Needs appointment with [facility] for a 4B nodule ( Lung Cancer Screening) [place], Please schedule patient with Dr. Valeta [name] for Monday if he has openings. This patient had a Lung RADS 4 B indicates suspicious findings for which additional diagnostic testing and or tissue sampling is recommended.  Last PFT's were 2017. He has a nodule that has increased in size from 58mm last year to 9 mm this year. Thanks so much

## 2018-11-12 ENCOUNTER — Encounter: Payer: Self-pay | Admitting: Pulmonary Disease

## 2018-11-12 ENCOUNTER — Other Ambulatory Visit: Payer: Self-pay

## 2018-11-12 ENCOUNTER — Ambulatory Visit: Payer: Medicare Other | Admitting: Pulmonary Disease

## 2018-11-12 VITALS — BP 148/80 | HR 72 | Temp 98.0°F | Ht 63.0 in | Wt 208.6 lb

## 2018-11-12 DIAGNOSIS — Z85118 Personal history of other malignant neoplasm of bronchus and lung: Secondary | ICD-10-CM

## 2018-11-12 DIAGNOSIS — R911 Solitary pulmonary nodule: Secondary | ICD-10-CM

## 2018-11-12 DIAGNOSIS — R918 Other nonspecific abnormal finding of lung field: Secondary | ICD-10-CM

## 2018-11-12 DIAGNOSIS — J449 Chronic obstructive pulmonary disease, unspecified: Secondary | ICD-10-CM

## 2018-11-12 DIAGNOSIS — C3431 Malignant neoplasm of lower lobe, right bronchus or lung: Secondary | ICD-10-CM | POA: Diagnosis not present

## 2018-11-12 DIAGNOSIS — Z23 Encounter for immunization: Secondary | ICD-10-CM | POA: Diagnosis not present

## 2018-11-12 DIAGNOSIS — Z902 Acquired absence of lung [part of]: Secondary | ICD-10-CM

## 2018-11-12 MED ORDER — BREZTRI AEROSPHERE 160-9-4.8 MCG/ACT IN AERO: 2.0000 | INHALATION_SPRAY | Freq: Two times a day (BID) | RESPIRATORY_TRACT | 0 refills | Status: AC

## 2018-11-12 NOTE — Patient Instructions (Addendum)
Thank you for visiting Dr. Valeta [name] at [facility]. Today we recommend the following:  Orders Placed This Encounter  Procedures  . NM PET Image Initial (PI) Skull Base To Thigh  . Pneumococcal polysaccharide vaccine 23-valent greater than or equal to 74yo subcutaneous/IM   Repeat imaging in 3 months.  Breztri samples today, call us if you are having trouble with urination  And you can go back to your old medication regimen   Return in about 3 months (around 02/12/2019) for with APP or Dr. Valeta [name].    Please do your part to reduce the spread of COVID-19.

## 2018-11-12 NOTE — Progress Notes (Signed)
Synopsis: Referred in October 2020 for abnormal CT by Sessoms, Lutricia Horsfall, MD  Subjective:   PATIENT ID: William Dyer GENDER: male DOB: September 10, 1945, MRN: [id number]  Chief Complaint  Patient presents with  . Follow-up    [place] BQ patient. Wears 2l at night with cpap. Using perforomist and budesonide daily. Abnormal CT.     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  Patient is followed in our lung cancer screening program.  Most recent lung cancer screening CT in September 2020 revealed a lung RADS 4B a suspicious 9 mm nodule within the medial right lung increased from 3 mm previously.  Former patient of Dr. Lake Bells last seen in the office March 2020.  At the time had significant recurrent ongoing exacerbations.  Was managed with azithromycin 250 mg daily plus neb regimen.  Unfortunately he had difficulty with urination on previous inhaler uses.  He would like to do something other than being on the nebulizers because of cost.  We will give him samples again today.  Patient is very anxious about his CT findings.  We discussed the risk benefits alternatives of various diagnostic methods versus watchful waiting.  He wants to talk to his daughter about all of this.  He is afraid of his current lung function and how well he breathes at baseline with a not he would even tolerate having a tissue diagnosis/bronchoscopy for biopsy.    Past Medical History:  Diagnosis Date  . CAD (coronary artery disease)    Remote PCI; s/p CABG x 3 in Feb 2013  . COPD (chronic obstructive pulmonary disease) ([place])   . Diabetes mellitus   . GERD (gastroesophageal reflux disease)   . Heart murmur   . Hyperlipidemia   . Hypothyroidism   . Lung cancer ([place]) 07/2000   status post right lower lobectomy for adenocarcinoma  .  Obesity   . Pneumonia      Family History  Problem Relation Age of Onset  . Emphysema Other   . Lung cancer Other      Past Surgical History:  Procedure Laterality Date  . CORONARY ANGIOPLASTY  1990's  . CORONARY ARTERY BYPASS GRAFT  03/18/2011   Procedure: CORONARY ARTERY BYPASS GRAFTING (CABG);  Surgeon: [name] [name], MD;  Location: [facility];  Service: Open Heart Surgery;  Laterality: N/A;  . INGUINAL HERNIA REPAIR  1960's   "? side"  . LUNG LOBECTOMY     right, lower; "for lung cancer"  . TONSILLECTOMY AND ADENOIDECTOMY  1950's    Social History   Socioeconomic History  . Marital status: Married    Spouse name: Not on file  . Number of children: Not on file  . Years of education: Not on file  . Highest education level: Not on file  Occupational History  . Not on file  Social Needs  . Financial resource strain: Not on file  . Food insecurity    Worry: Not on file    Inability: Not on file  . Transportation needs    Medical: Not on file    Non-medical: Not on file  Tobacco Use  . Smoking status: Former Smoker    Packs/day: 1.00    Years: 50.00    Pack years: 50.00    Types: Cigarettes    Quit date: 02/10/2018    Years  since quitting: 0.7  . Smokeless tobacco: Never Used  Substance and Sexual Activity  . Alcohol use: No    Alcohol/week: 0.0 standard drinks  . Drug use: No  . Sexual activity: Not Currently  Lifestyle  . Physical activity    Days per week: Not on file    Minutes per session: Not on file  . Stress: Not on file  Relationships  . Social Herbalist on phone: Not on file    Gets together: Not on file    Attends religious service: Not on file    Active member of club or organization: Not on file    Attends meetings of clubs or organizations: Not on file    Relationship status: Not on file  . Intimate partner violence    Fear of current or ex partner: Not on file    Emotionally abused: Not on file    Physically abused: Not on  file    Forced sexual activity: Not on file  Other Topics Concern  . Not on file  Social History Narrative  . Not on file     Allergies  Allergen Reactions  . Rosuvastatin Calcium     Failed lipitor and Zocor due to muscle aches  . Statins     Failed lipitor and Zocor due to muscle aches  . Sulfonamide Derivatives Other (See Comments)    Patient "drasws up" muscularly     Outpatient Medications Prior to Visit  Medication Sig Dispense Refill  . albuterol (PROAIR HFA) 108 (90 Base) MCG/ACT inhaler Inhale 1-2 puffs into the lungs every 6 (six) hours as needed for wheezing or shortness of breath. 1 Inhaler 6  . aspirin 81 MG tablet Take 81 mg by mouth daily.    . formoterol (PERFOROMIST) 20 MCG/2ML nebulizer solution Take 2 mLs (20 mcg total) by nebulization 2 (two) times daily. 120 mL 5  . guaiFENesin (MUCINEX) 600 MG 12 hr tablet Take 600 mg by mouth 2 (two) times daily as needed for to loosen phlegm. For congestion    . levothyroxine (SYNTHROID, LEVOTHROID) 137 MCG tablet Take 137 mcg by mouth daily.    Marland Kitchen lisinopril-hydrochlorothiazide (PRINZIDE,ZESTORETIC) 10-12.5 MG per tablet 1/2 tab by mouth once daily    . loratadine (CLARITIN) 10 MG tablet Take 10 mg by mouth daily as needed for allergies or rhinitis.     . metFORMIN (GLUCOPHAGE) 1000 MG tablet Take 1,000 mg by mouth 2 (two) times daily with a meal.     . nitroGLYCERIN (NITROSTAT) 0.4 MG SL tablet Place 1 tablet (0.4 mg total) under the tongue every 5 (five) minutes as needed for chest pain (3 doses max). 25 tablet 3  . nystatin (MYCOSTATIN) 100000 UNIT/ML suspension Take 5 mLs (500,000 Units total) by mouth 4 (four) times daily. 60 mL 0  . nystatin cream (MYCOSTATIN) Apply 1 application topically 2 (two) times daily. 30 g 0  . tamsulosin (FLOMAX) 0.4 MG CAPS capsule Take 0.4 mg by mouth daily after supper.     . Vitamin D, Ergocalciferol, (DRISDOL) 1.25 MG (50000 UT) CAPS capsule Take 50,000 Units by mouth once a week.     No  facility-administered medications prior to visit.     Review of Systems  Constitutional: Negative for chills, fever, malaise/fatigue and weight loss.  HENT: Negative for hearing loss, sore throat and tinnitus.   Eyes: Negative for blurred vision and double vision.  Respiratory: Positive for shortness of breath. Negative for cough, hemoptysis, sputum  production, wheezing and stridor.   Cardiovascular: Negative for chest pain, palpitations, orthopnea, leg swelling and PND.  Gastrointestinal: Negative for abdominal pain, constipation, diarrhea, heartburn, nausea and vomiting.  Genitourinary: Negative for dysuria, hematuria and urgency.  Musculoskeletal: Negative for joint pain and myalgias.  Skin: Negative for itching and rash.  Neurological: Negative for dizziness, tingling, weakness and headaches.  Endo/Heme/Allergies: Negative for environmental allergies. Does not bruise/bleed easily.  Psychiatric/Behavioral: Negative for depression. The patient is not nervous/anxious and does not have insomnia.   All other systems reviewed and are negative.    Objective:  Physical Exam Vitals signs reviewed.  Constitutional:      General: He is not in acute distress.    Appearance: He is well-developed.  HENT:     Head: Normocephalic and atraumatic.  Eyes:     General: No scleral icterus.    Conjunctiva/sclera: Conjunctivae normal.     Pupils: Pupils are equal, round, and reactive to light.  Neck:     Musculoskeletal: Neck supple.     Vascular: No JVD.     Trachea: No tracheal deviation.  Cardiovascular:     Rate and Rhythm: Normal rate and regular rhythm.     Heart sounds: Normal heart sounds. No murmur.  Pulmonary:     Effort: Pulmonary effort is normal. No tachypnea, accessory muscle usage or respiratory distress.     Breath sounds: No stridor. No wheezing, rhonchi or rales.     Comments: Diminished breath sounds   Abdominal:     General: Bowel sounds are normal. There is no  distension.     Palpations: Abdomen is soft.     Tenderness: There is no abdominal tenderness.  Musculoskeletal:        General: No tenderness.  Lymphadenopathy:     Cervical: No cervical adenopathy.  Skin:    General: Skin is warm and dry.     Capillary Refill: Capillary refill takes less than 2 seconds.     Findings: No rash.  Neurological:     Mental Status: He is alert and oriented to person, place, and time.  Psychiatric:        Behavior: Behavior normal.      Vitals:   11/12/18 1412  BP: (!) 148/80  Pulse: 72  Temp: 98 F (36.7 C)  TempSrc: Temporal  SpO2: 96%  Weight: 208 lb 9.6 oz (94.6 kg)  Height: 5\' 3"  (1.6 m)   96% on RA BMI Readings from Last 3 Encounters:  11/12/18 36.95 kg/m  07/23/18 38.04 kg/m  04/16/18 36.84 kg/m   Wt Readings from Last 3 Encounters:  11/12/18 208 lb 9.6 oz (94.6 kg)  07/23/18 208 lb (94.3 kg)  04/16/18 201 lb 6.4 oz (91.4 kg)     CBC    Component Value Date/Time   WBC 16.2 (H) 05/15/2013 0404   RBC 3.84 (L) 05/15/2013 0404   HGB 12.1 (L) 05/15/2013 0404   HCT 35.6 (L) 05/15/2013 0404   PLT 223 05/15/2013 0404   MCV 92.7 05/15/2013 0404   MCH 31.5 05/15/2013 0404   MCHC 34.0 05/15/2013 0404   RDW 14.4 05/15/2013 0404   LYMPHSABS 1.5 05/12/2013 2249   MONOABS 1.6 (H) 05/12/2013 2249   EOSABS 0.0 05/12/2013 2249   BASOSABS 0.0 05/12/2013 2249    Chest Imaging: September 2020 lung cancer screening CT: Lung RADS 4B, suspicious 9 mm nodule within the medial right lung increased in size from 3 mm from previous imaging.  Pulmonary Functions Testing Results: PFT  Results Latest Ref Rng & Units 04/30/2015  FVC-Pre L 1.85  FVC-Predicted Pre % 54  FVC-Post L 2.20  FVC-Predicted Post % 64  Pre FEV1/FVC % % 56  Post FEV1/FCV % % 57  FEV1-Pre L 1.03  FEV1-Predicted Pre % 41  FEV1-Post L 1.26  DLCO UNC% % 54  DLCO COR %Predicted % 83    FeNO: None   Pathology:   RLL Lobectomy 07/1999: I. RIGHT LOWER LOBE OF  LUNG, WEDGE RESECTION: INVASIVE  ADENOCARCINOMA WITH FOCI OF SQUAMOUS DIFFERENTIATION  (ADENOSQUAMOUS CARCINOMA). TUMOR EXTENDS CLOSE TO STAPLED  MARGIN.   II. NEW MARGIN OF RESECTION: FREE OF TUMOR.   Echocardiogram: None   Heart Catheterization: None     Assessment & Plan:     ICD-10-CM   1. Nodule of upper lobe of right lung  R91.1   2. Stage 3 severe COPD by GOLD classification ([place])  J44.9   3. Hx of cancer of lung  Z85.118   4. 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 S/P lobectomy of lung  Z90.2     Discussion:  73 year old gentleman past medical history of primary adenocarcinoma of the right lower lobe status post lobectomy nearly 20+ years ago.  Part of her lung cancer screening program found to have a new enlarging right upper lobe medial nodule.  At this point 9 mm in largest [place] section.  Unfortunately he does have severe COPD last PFTs in 2017 with an FEV1 to 50% predicted.  Postbronchodilator.  Plan: Patient is very anxious about undergoing a potential biopsy for this procedure.  He does have severe lung disease. He is not sure that he would want to risk potentially ending up or needing mechanical support on the ventilator. This makes him very anxious. I did explain the risk benefits and alternatives of proceeding with biopsy. I do believe the best approach for biopsy for him would be for a electromagnetic navigational bronchoscopy.  A percutaneous approach is likely to be risky due to the amount of emphysema that he has as well as how medial the lesion is located.   Patient is also not sure that he would want to undergo radiation therapy. He is not a candidate for having surgical resection and he does not want to have this due to the significant recovery that it would require following.  Not to mention he is already had a right lower lobectomy and this would require a upper lobectomy on the same side.  Therefore I think the best  decision to be made is for him to decide whether or not he would want to receive treatment with SBRT.  If he would not want to have any treatment for this with radiation then there would not be any reason to proceed with biopsy.  The patient states that he wants to wait 3 months to see if this has grown anymore before he makes any decisions.  Therefore the next best step for evaluation I believe would be a repeat PET CT to be completed in January 2021.  Patient to return to clinic following completion of the PET CT images.  And we will make plans accordingly.  Greater than 50% of this patient's 60-minute office visit was spent face-to-face discussing above recommendations treatment plan also discussing risk benefits and alternatives of proceeding with bronchoscopy for biopsy versus a percutaneous approach versus watchful waiting versus treatments to consider radiation therapy for the lung nodule.    Current Outpatient  Medications:  .  albuterol (PROAIR HFA) 108 (90 Base) MCG/ACT inhaler, Inhale 1-2 puffs into the lungs every 6 (six) hours as needed for wheezing or shortness of breath., Disp: 1 Inhaler, Rfl: 6 .  aspirin 81 MG tablet, Take 81 mg by mouth daily., Disp: , Rfl:  .  formoterol (PERFOROMIST) 20 MCG/2ML nebulizer solution, Take 2 mLs (20 mcg total) by nebulization 2 (two) times daily., Disp: 120 mL, Rfl: 5 .  guaiFENesin (MUCINEX) 600 MG 12 hr tablet, Take 600 mg by mouth 2 (two) times daily as needed for to loosen phlegm. For congestion, Disp: , Rfl:  .  levothyroxine (SYNTHROID, LEVOTHROID) 137 MCG tablet, Take 137 mcg by mouth daily., Disp: , Rfl:  .  lisinopril-hydrochlorothiazide (PRINZIDE,ZESTORETIC) 10-12.5 MG per tablet, 1/2 tab by mouth once daily, Disp: , Rfl:  .  loratadine (CLARITIN) 10 MG tablet, Take 10 mg by mouth daily as needed for allergies or rhinitis. , Disp: , Rfl:  .  metFORMIN (GLUCOPHAGE) 1000 MG tablet, Take 1,000 mg by mouth 2 (two) times daily with a meal. ,  Disp: , Rfl:  .  nitroGLYCERIN (NITROSTAT) 0.4 MG SL tablet, Place 1 tablet (0.4 mg total) under the tongue every 5 (five) minutes as needed for chest pain (3 doses max)., Disp: 25 tablet, Rfl: 3 .  nystatin (MYCOSTATIN) 100000 UNIT/ML suspension, Take 5 mLs (500,000 Units total) by mouth 4 (four) times daily., Disp: 60 mL, Rfl: 0 .  nystatin cream (MYCOSTATIN), Apply 1 application topically 2 (two) times daily., Disp: 30 g, Rfl: 0 .  tamsulosin (FLOMAX) 0.4 MG CAPS capsule, Take 0.4 mg by mouth daily after supper. , Disp: , Rfl:  .  Vitamin D, Ergocalciferol, (DRISDOL) 1.25 MG (50000 UT) CAPS capsule, Take 50,000 Units by mouth once a week., Disp: , Rfl:    Garner Nash, DO [facility] 11/12/2018 2:28 PM

## 2018-12-23 NOTE — Patient Instructions (Addendum)
Thank you for visiting [place] today!  Your LDL (bad cholesterol) goal is <55 mg/dL.  Medication changes: Start Praluent 75 mg injected under the skin once every two weeks. We will send your information to the [place] and get you approved from a grant to cover the cost of this new medication.  You will have lab work done today to check your cholesterol, and we will call you with the results.   Please give [place] a call at [phone] if you have any further questions or concerns.

## 2018-12-23 NOTE — Progress Notes (Signed)
Patient ID: William Dyer                 DOB: 12-24-1945                    MRN: [id number]     HPI: William Dyer is a 73 y.o. male patient referred to lipid clinic by Dr. [name]. 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 During recent lung cancer screen (10/30/2018), found to have a new enlarging right upper lobe medial nodule, with plans to re-evaluate and make decision on next steps in January 2021. Patient has a history of statin intolerance, and was placed on Praluent in 2015 which ultimately reduced his LDL from 169 to 72, but stopped Praluent due to cost in 2017. Enrolled in one of the CLEAR research trials in 2017.    Today, patient presents to clinic in reasonable spirits as he is still dealing with recent lung related health issues. His physical activity is severely limited due to his COPD, but endorses attempts to improve diet. Patient endorsed his history of trying multiple statins but being unable to tolerate them due to significant joint and muscle pain. He was initially hesitant to consider trying Praluent again, but after discussion of therapy options and CV risks of not treating, he was willing to give it a try if he is able to receive patient assistance. He provided the necessary information to apply for a grant through the [employer]. He states he is currently still in the CLEAR research trial, but was planning to drop out soon as he felt he wasn't getting any benefit from the trial after three years. He says he has not had lipids checked recently and would be fine with having them checked today.   Current Medications: None Intolerances: simvastatin, atorvastatin (10 mg), rosuvastatin (various doses and alternative dosing strategies tried) --Myalgias with all.  Risk Factors: Age, CAD, Diabetes, elevated LDL LDL goal: <55  Diet:  Breakfast - sausage, bacon, eggs, toast, or cereal. Coffee. Lunch - doesn't eat much, mostly snacks (chips, etc.) Supper -soup, salmon, fish.   Exercise: None due to current conditions   Family History: Emphysema in his unknown relative; Lung cancer in his unknown relative.  Social History: Former smoker (1 pack/day for 50 years, quit 02/10/2018), no alcohol or illicit drug use.   Labs: none recently, sending today (12/24/18).   Past Medical History:  Diagnosis Date  . CAD (coronary artery disease)    Remote PCI; s/p CABG x 3 in Feb 2013  . COPD (chronic obstructive pulmonary disease) ([place])   . Diabetes mellitus   . GERD (gastroesophageal reflux disease)   . Heart murmur   . Hyperlipidemia   . Hypothyroidism   . Lung cancer ([place]) 07/2000   status post right lower lobectomy for adenocarcinoma  . Obesity   . Pneumonia     Current Outpatient Medications on File Prior to Visit  Medication Sig Dispense Refill  . albuterol (PROAIR HFA) 108 (90 Base) MCG/ACT inhaler Inhale 1-2 puffs into the lungs every 6 (six) hours as needed for wheezing or shortness of breath. 1 Inhaler 6  . aspirin 81 MG tablet Take 81 mg by mouth daily.    . formoterol (PERFOROMIST) 20 MCG/2ML nebulizer solution Take 2 mLs (20 mcg total) by nebulization 2 (two) times daily. 120 mL 5  .  guaiFENesin (MUCINEX) 600 MG 12 hr tablet Take 600 mg by mouth 2 (two) times daily as needed for to loosen phlegm. For congestion    . levothyroxine (SYNTHROID, LEVOTHROID) 137 MCG tablet Take 137 mcg by mouth daily.    Marland Kitchen lisinopril-hydrochlorothiazide (PRINZIDE,ZESTORETIC) 10-12.5 MG per tablet 1/2 tab by mouth once daily    . loratadine (CLARITIN) 10 MG tablet Take 10 mg by mouth daily as needed for allergies or rhinitis.     . metFORMIN (GLUCOPHAGE) 1000 MG tablet Take 1,000 mg by mouth 2 (two) times daily with a meal.     . nitroGLYCERIN (NITROSTAT) 0.4 MG SL tablet Place 1 tablet (0.4 mg total) under the tongue every 5 (five)  minutes as needed for chest pain (3 doses max). 25 tablet 3  . nystatin (MYCOSTATIN) 100000 UNIT/ML suspension Take 5 mLs (500,000 Units total) by mouth 4 (four) times daily. 60 mL 0  . nystatin cream (MYCOSTATIN) Apply 1 application topically 2 (two) times daily. 30 g 0  . tamsulosin (FLOMAX) 0.4 MG CAPS capsule Take 0.4 mg by mouth daily after supper.     . Vitamin D, Ergocalciferol, (DRISDOL) 1.25 MG (50000 UT) CAPS capsule Take 50,000 Units by mouth once a week.     No current facility-administered medications on file prior to visit.     Allergies  Allergen Reactions  . Rosuvastatin Calcium     Failed lipitor and Zocor due to muscle aches  . Statins     Failed lipitor and Zocor due to muscle aches  . Sulfonamide Derivatives Other (See Comments)    Patient "drasws up" muscularly    Assessment/Plan:  1. Hyperlipidemia - LDL goal <55. Patient likely remains above goal as he is not currently on cholesterol lowering medications, but labs have not been drawn for some time. Given patient's history of statin intolerance, start Praluent 75 mg [place] q2weeks if approved for a grant through the [employer]. Patient counseled on how to correctly administer Praluent at home. Provided patient with counseling on a heart healthy diet, and ways to add exercise if able in the future. Patient to have lipids checked today, and will f/u via phone with the results as well as grant information.   Claudina Lick, PharmD Candidate [phone] [place] [address] [place] [address] Phone: [phone]; Fax: [phone]  Megan E. Supple, PharmD, BCACP, [place] [address] [place], [place] [address] Phone: [phone]; Fax: [phone] 12/24/2018 1:45 PM

## 2018-12-24 ENCOUNTER — Ambulatory Visit (INDEPENDENT_AMBULATORY_CARE_PROVIDER_SITE_OTHER): Payer: Medicare Other | Admitting: Pharmacist

## 2018-12-24 ENCOUNTER — Other Ambulatory Visit: Payer: Self-pay

## 2018-12-24 DIAGNOSIS — E782 Mixed hyperlipidemia: Secondary | ICD-10-CM

## 2018-12-25 ENCOUNTER — Telehealth: Payer: Self-pay | Admitting: Pharmacist

## 2018-12-25 DIAGNOSIS — E782 Mixed hyperlipidemia: Secondary | ICD-10-CM

## 2018-12-25 LAB — LIPID PANEL
Chol/HDL Ratio: 5.1 ratio — ABNORMAL HIGH (ref 0.0–5.0)
Cholesterol, Total: 266 mg/dL — ABNORMAL HIGH (ref 100–199)
HDL: 52 mg/dL (ref >39)
LDL Chol Calc (NIH): 168 mg/dL — ABNORMAL HIGH (ref 0–99)
Triglycerides: 248 mg/dL — ABNORMAL HIGH (ref 0–149)
VLDL Cholesterol Cal: 46 mg/dL — ABNORMAL HIGH (ref 5–40)

## 2018-12-25 LAB — LDL CHOLESTEROL, DIRECT: LDL Direct: 175 mg/dL — ABNORMAL HIGH (ref 0–99)

## 2018-12-25 MED ORDER — PRALUENT 75 MG/ML ~~LOC~~ SOAJ: 1.0000 "pen " | SUBCUTANEOUS | 11 refills | Status: DC

## 2018-12-25 NOTE — Telephone Encounter (Signed)
Praluent prior auth approved through 02/07/20. Rx sent to pharmacy, pt also approved for $2500 in copay assistance through [facility]. Pt is aware, follow up labs scheduled in February.

## 2018-12-28 ENCOUNTER — Encounter: Payer: Self-pay | Admitting: *Deleted

## 2018-12-28 DIAGNOSIS — Z006 Encounter for examination for normal comparison and control in clinical research program: Secondary | ICD-10-CM

## 2018-12-28 NOTE — Research (Signed)
Subject re-consented to [place] version 7.0, local 15Apr2020 on [id number]  To research clinic for visit [phone] in the clear research study.  He has made the decision to try an injectable, praluent. No longer wants to take study IP, but has agreed to telephone follow ups for his visits.  No aes or saes to report.

## 2019-01-21 NOTE — Progress Notes (Signed)
.   Date:  01/21/2019   ID:  William Dyer, DOB May 03, 1945, MRN [id number]  Provider Location: Office  PCP:  William Ren, MD  Cardiologist:   [name] Electrophysiologist:  None   Evaluation Performed:  Follow-Up Visit  Chief Complaint:  CAD/CABG  History of Present Illness:    73 y.o. history of CABG 2013 SVG to D1, SVG to RCA and LIMA to LAD. Significant COPD followed by Dr Lake Bells Still smoking Multiple pneumonias this spring with 6 week Rx course azithromycin Lung cancer screening CT negative for cancer but bad emphysema Last myovue done 10/30/17 EF 65% diaphragmatic attenuation with no ischemia low risk study  Not very active with [facility] from long term erythromycin use No angina  Seeing lung doctor at [facility] as well Stopped smoking earlier this year Got financial assistance for PSK-9 last month  LDL not at goal 175 prior to starting   His last CT was abnormal and needs PET Scan to r/o cancer He does not want to have a biopsy Will see Dr Windell Norfolk in January after PET/Scan   The patient  does not have symptoms concerning for COVID-19 infection (fever, chills, cough, or new shortness of breath).    Past Medical History:  Diagnosis Date  . CAD (coronary artery disease)    Remote PCI; s/p CABG x 3 in Feb 2013  . COPD (chronic obstructive pulmonary disease) ([place])   . Diabetes mellitus   . GERD (gastroesophageal reflux disease)   . Heart murmur   . Hyperlipidemia   . Hypothyroidism   . Lung cancer ([place]) 07/2000   status post right lower lobectomy for adenocarcinoma  . Obesity   . Pneumonia    Past Surgical History:  Procedure Laterality Date  . CORONARY ANGIOPLASTY  1990's  . CORONARY ARTERY BYPASS GRAFT  03/18/2011   Procedure: CORONARY ARTERY BYPASS GRAFTING (CABG);  Surgeon: [name] [name], MD;  Location: [facility];  Service: Open Heart Surgery;  Laterality: N/A;  . INGUINAL HERNIA REPAIR  1960's   "? side"  . LUNG LOBECTOMY     right, lower; "for lung  cancer"  . TONSILLECTOMY AND ADENOIDECTOMY  1950's     No outpatient medications have been marked as taking for the 01/28/19 encounter (Appointment) with Josue Hector, MD.     Allergies:   Rosuvastatin calcium, Statins, and Sulfonamide derivatives   Social History   Tobacco Use  . Smoking status: Former Smoker    Packs/day: 1.00    Years: 50.00    Pack years: 50.00    Types: Cigarettes    Quit date: 02/10/2018    Years since quitting: 0.9  . Smokeless tobacco: Never Used  Substance Use Topics  . Alcohol use: No    Alcohol/week: 0.0 standard drinks  . Drug use: No     Family Hx: The patient's family history includes Emphysema in an other family member; Lung cancer in an other family member.  ROS:   Please see the history of present illness.     All other systems reviewed and are negative.   Prior CV studies:   The following studies were reviewed today:  Myovue 10/30/17  Labs/Other Tests and Data Reviewed:    EKG:  01/05/17 SR RBBB LPFB 01/28/19 ST rate 100 RBBB   Recent Labs: No results found for requested labs within last 8760 hours.   Recent Lipid Panel Lab Results  Component Value Date/Time   CHOL 266 (H) 12/24/2018 12:11 PM  TRIG 248 (H) 12/24/2018 12:11 PM   HDL 52 12/24/2018 12:11 PM   CHOLHDL 5.1 (H) 12/24/2018 12:11 PM   CHOLHDL 3.4 08/07/2015 08:51 AM   LDLCALC 168 (H) 12/24/2018 12:11 PM   LDLDIRECT 175 (H) 12/24/2018 12:11 PM   LDLDIRECT 72.0 03/31/2014 08:50 AM    Wt Readings from Last 3 Encounters:  12/24/18 206 lb (93.4 kg)  11/12/18 208 lb 9.6 oz (94.6 kg)  07/23/18 208 lb (94.3 kg)     Objective:    Vital Signs:  There were no vitals taken for this visit.   Affect appropriate Healthy:  appears stated age 53: normal Neck supple with no adenopathy JVP normal no bruits no thyromegaly Lungs clear with no wheezing and good diaphragmatic motion Heart:  S1/S2 no murmur, no rub, gallop or click PMI normal post sternotomy   Abdomen: benighn, BS positve, no tenderness, no AAA no bruit.  No HSM or HJR Distal pulses intact with no bruits No edema Neuro non-focal Skin warm and dry No muscular weakness   ASSESSMENT & PLAN:    CAD: CABG 2013 Non ischemic myovue 10/30/17 EF 65% no RWMA;s continue medical Rx   Chol: received financial assistance for Praluent   DM: Discussed low carb diet.  Target hemoglobin A1c is 6.5 or less.  Continue current medications.  COPD:  Nebulizer helps   F/U [name] for PET/CT quit smoking a year ago   Thyroid   On synthroid replacement labs with primary   RBBB  Likely related to lung disease f/u ECG yearly   COVID-19 Education: The signs and symptoms of COVID-19 were discussed with the patient and how to seek care for testing (follow up with PCP or arrange E-visit).  The importance of social distancing was discussed today.  Time:   Today, I have spent 30 minutes with the patient      Medication Adjustments/Labs and Tests Ordered: Current medicines are reviewed at length with the patient today.  Concerns regarding medicines are outlined above.   Tests Ordered:  None   Medication Changes:  None   Disposition:  Follow up in 6 months   Signed, Jenkins Rouge, MD  01/21/2019 3:16 PM    [name]

## 2019-01-28 ENCOUNTER — Encounter: Payer: Self-pay | Admitting: Cardiovascular Disease

## 2019-01-28 ENCOUNTER — Ambulatory Visit (INDEPENDENT_AMBULATORY_CARE_PROVIDER_SITE_OTHER): Payer: Medicare Other | Admitting: Cardiovascular Disease

## 2019-01-28 ENCOUNTER — Other Ambulatory Visit: Payer: Self-pay

## 2019-01-28 VITALS — BP 134/64 | HR 100 | Ht 63.5 in | Wt 215.2 lb

## 2019-01-28 DIAGNOSIS — I25810 Atherosclerosis of coronary artery bypass graft(s) without angina pectoris: Secondary | ICD-10-CM | POA: Diagnosis not present

## 2019-01-28 NOTE — Patient Instructions (Signed)
Medication Instructions:   *If you need a refill on your cardiac medications before your next appointment, please call your pharmacy*  Lab Work:  If you have labs (blood work) drawn today and your tests are completely normal, you will receive your results only by: Marland Kitchen MyChart Message (if you have MyChart) OR . A paper copy in the mail If you have any lab test that is abnormal or we need to change your treatment, we will call you to review the results.  Testing/Procedures: None ordered today.  Follow-Up: At [facility], you and your health needs are our priority.  As part of our continuing mission to provide you with exceptional heart care, we have created designated Provider Care Teams.  These Care Teams include your primary Cardiologist (physician) and Advanced Practice Providers (APPs -  Physician Assistants and Nurse Practitioners) who all work together to provide you with the care you need, when you need it.  Your next appointment:   6 month(s)  The format for your next appointment:   In Person  Provider:   You may see Jenkins Rouge, MD or one of the following Advanced Practice Providers on your designated Care Team:    Truitt Merle, NP  Cecilie Kicks, NP  Kathyrn Drown, NP

## 2019-02-12 ENCOUNTER — Other Ambulatory Visit: Payer: Self-pay

## 2019-02-12 ENCOUNTER — Ambulatory Visit (HOSPITAL_COMMUNITY)
Admission: RE | Admit: 2019-02-12 | Discharge: 2019-02-12 | Disposition: A | Discharge status: Home / Self Care | Payer: Medicare Other | Source: Ambulatory Visit | Attending: Pulmonary Disease | Admitting: Pulmonary Disease

## 2019-02-12 DIAGNOSIS — R918 Other nonspecific abnormal finding of lung field: Secondary | ICD-10-CM | POA: Insufficient documentation

## 2019-02-12 DIAGNOSIS — E785 Hyperlipidemia, unspecified: Secondary | ICD-10-CM | POA: Insufficient documentation

## 2019-02-12 DIAGNOSIS — Z951 Presence of aortocoronary bypass graft: Secondary | ICD-10-CM | POA: Diagnosis not present

## 2019-02-12 DIAGNOSIS — I2510 Atherosclerotic heart disease of native coronary artery without angina pectoris: Secondary | ICD-10-CM | POA: Insufficient documentation

## 2019-02-12 DIAGNOSIS — Z87891 Personal history of nicotine dependence: Secondary | ICD-10-CM | POA: Diagnosis not present

## 2019-02-12 DIAGNOSIS — E039 Hypothyroidism, unspecified: Secondary | ICD-10-CM | POA: Diagnosis not present

## 2019-02-12 DIAGNOSIS — Z85118 Personal history of other malignant neoplasm of bronchus and lung: Secondary | ICD-10-CM | POA: Diagnosis not present

## 2019-02-12 DIAGNOSIS — E119 Type 2 diabetes mellitus without complications: Secondary | ICD-10-CM | POA: Diagnosis not present

## 2019-02-12 LAB — GLUCOSE, CAPILLARY: Glucose-Capillary: 147 mg/dL — ABNORMAL HIGH (ref 70–99)

## 2019-02-12 MED ORDER — FLUDEOXYGLUCOSE F - 18 (FDG) INJECTION
10.5400 | Freq: Once | INTRAVENOUS | Status: AC
  Administered 2019-02-12: 10.54 via INTRAVENOUS

## 2019-02-14 ENCOUNTER — Telehealth: Payer: Self-pay | Admitting: Pulmonary Disease

## 2019-02-14 DIAGNOSIS — R911 Solitary pulmonary nodule: Secondary | ICD-10-CM

## 2019-02-14 NOTE — Telephone Encounter (Signed)
PCCM:  I called and spoke with William Dyer regarding his PET scan.  PET avid right upper lobe nodule concerning for primary bronchogenic carcinoma.  Patient has past medical history of severe COPD.  Significant emphysema.  This is a centrally located lesion.  He is very anxious about undergoing biopsy for this.  I believe percutaneous approach would be high risk for pneumothorax.  Additionally electromagnetic navigational bronchoscopy would be technically challenging due to its proximal location and the patient does have significant concern undergoing bronchoscopy.  Therefore [place] and risk would deem him high risk for this as well.  I discussed referral to radiation oncology for consideration of SBRT based on imaging alone.  I will place a referral to the radiation oncology department.  Patient also has some hesitancy about this but I explained before he made a decision not to undergo any type of therapy that he should discuss this with one of our radiation oncologist.  William Nash, DO [facility] 02/14/2019 5:46 PM

## 2019-02-21 NOTE — Progress Notes (Signed)
Thoracic Location of Tumor / Histology: Per PET 02/12/19: 14 mm irregular/spiculated nodule in the medial right upper lobe, highly suspicious for primary bronchogenic neoplasm  Patient presented with symptoms of: 74 year old gentleman past medical history of lung cancer status post right lower lobectomy for adenocarcinoma, history of COPD, history of coronary artery disease status post CABG in 2013, type 2 diabetes, gastroesophageal reflux disease.  Patient is followed in our lung cancer screening program.  Most recent lung cancer screening CT in September 2020 revealed a lung RADS 4B a suspicious 9 mm nodule within the medial right lung increased from 3 mm previously.  Former patient of Dr. Lake Bells last seen in the office March 2020.  At the time had significant recurrent ongoing exacerbations.  Was managed with azithromycin 250 mg daily plus neb regimen.  Unfortunately he had difficulty with urination on previous inhaler uses.  He would like to do something other than being on the nebulizers because of cost.  We will give him samples again today.  Patient is very anxious about his CT findings.  We discussed the risk benefits alternatives of various diagnostic methods versus watchful waiting.  He wants to talk to his daughter about all of this.  He is afraid of his current lung function and how well he breathes at baseline with a not he would even tolerate having a tissue diagnosis/bronchoscopy for biopsy.   Tobacco/Marijuana/Snuff/ETOH use:  Tobacco Use  . Smoking status: Former Smoker    Packs/day: 1.00    Years: 50.00    Pack years: 50.00    Types: Cigarettes    Quit date: 02/10/2018    Years since quitting: 0.9  . Smokeless tobacco: Never Used  Substance Use Topics  . Alcohol use: No    Alcohol/week: 0.0 standard drinks  . Drug use: No     Per Dr. Valeta [name] 02/14/19: I called and spoke with Mr. Beske regarding his PET scan.  PET avid right upper lobe nodule concerning for primary  bronchogenic carcinoma.  Patient has past medical history of severe COPD.  Significant emphysema.  This is a centrally located lesion.  He is very anxious about undergoing biopsy for this.  I believe percutaneous approach would be high risk for pneumothorax.  Additionally electromagnetic navigational bronchoscopy would be technically challenging due to its proximal location and the patient does have significant concern undergoing bronchoscopy.  Therefore [place] and risk would deem him high risk for this as well.  I discussed referral to radiation oncology for consideration of SBRT based on imaging alone.  I will place a referral to the radiation oncology department.  Patient also has some hesitancy about this but I explained before he made a decision not to undergo any type of therapy that he should discuss this with one of our radiation oncologist.  Garner Nash, DO [facility] 02/14/2019 5:46 PM   Past/Anticipated interventions by medical oncology, if any: None at this time.  Signs/Symptoms Weight changes, if any:  Wt Readings from Last 3 Encounters:  02/25/19 219 lb 2 oz (99.4 kg)  01/28/19 215 lb 3.2 oz (97.6 kg)  12/24/18 206 lb (93.4 kg)     Respiratory complaints, if any: Pt with COPD. Pt has SOB with exertion. Wears CPAP at night, "with oxygen". Pt denies cough.  Hemoptysis, if any: Pt denies hemoptysis.  Pain issues, if any:  Pt reports daily, chronic pain in lower back and right midback from "when they cut the lower lobe off  my lung". Pt rates pain 6/10.  SAFETY ISSUES:  Prior radiation? no  Pacemaker/ICD? no   Possible current pregnancy? N/A  Is the patient on methotrexate? no  Current Complaints / other details:  Pt presents today for initial consult with Dr. [name] for Radiation Oncology. Pt is unaccompanied.   BP (!) 161/71 (BP Location: Left Arm, Patient Position: Sitting)   Pulse 75   Temp 98.3 F (36.8 C) (Temporal)    Resp 20   Ht 5' 0.5" (1.537 m)   Wt 219 lb 2 oz (99.4 kg)   SpO2 96%   BMI 42.09 kg/m   Loma Sousa, RN BSN

## 2019-02-25 ENCOUNTER — Ambulatory Visit
Admission: RE | Admit: 2019-02-25 | Discharge: 2019-02-25 | Disposition: A | Discharge status: Home / Self Care | Payer: Medicare Other | Source: Ambulatory Visit | Attending: Radiation Oncology | Admitting: Radiation Oncology

## 2019-02-25 ENCOUNTER — Other Ambulatory Visit: Payer: Self-pay

## 2019-02-25 ENCOUNTER — Encounter: Payer: Self-pay | Admitting: Radiation Oncology

## 2019-02-25 DIAGNOSIS — E119 Type 2 diabetes mellitus without complications: Secondary | ICD-10-CM | POA: Diagnosis not present

## 2019-02-25 DIAGNOSIS — C3401 Malignant neoplasm of right main bronchus: Secondary | ICD-10-CM

## 2019-02-25 DIAGNOSIS — R911 Solitary pulmonary nodule: Secondary | ICD-10-CM | POA: Insufficient documentation

## 2019-02-25 DIAGNOSIS — J449 Chronic obstructive pulmonary disease, unspecified: Secondary | ICD-10-CM | POA: Insufficient documentation

## 2019-02-25 DIAGNOSIS — E039 Hypothyroidism, unspecified: Secondary | ICD-10-CM | POA: Diagnosis not present

## 2019-02-25 DIAGNOSIS — Z7982 Long term (current) use of aspirin: Secondary | ICD-10-CM | POA: Diagnosis not present

## 2019-02-25 DIAGNOSIS — Z7984 Long term (current) use of oral hypoglycemic drugs: Secondary | ICD-10-CM | POA: Insufficient documentation

## 2019-02-25 DIAGNOSIS — I2510 Atherosclerotic heart disease of native coronary artery without angina pectoris: Secondary | ICD-10-CM | POA: Insufficient documentation

## 2019-02-25 DIAGNOSIS — K219 Gastro-esophageal reflux disease without esophagitis: Secondary | ICD-10-CM | POA: Diagnosis not present

## 2019-02-25 DIAGNOSIS — Z801 Family history of malignant neoplasm of trachea, bronchus and lung: Secondary | ICD-10-CM | POA: Diagnosis not present

## 2019-02-25 DIAGNOSIS — Z79899 Other long term (current) drug therapy: Secondary | ICD-10-CM | POA: Insufficient documentation

## 2019-02-25 DIAGNOSIS — Z85118 Personal history of other malignant neoplasm of bronchus and lung: Secondary | ICD-10-CM | POA: Diagnosis not present

## 2019-02-25 DIAGNOSIS — E669 Obesity, unspecified: Secondary | ICD-10-CM | POA: Diagnosis not present

## 2019-02-25 DIAGNOSIS — Z87891 Personal history of nicotine dependence: Secondary | ICD-10-CM | POA: Insufficient documentation

## 2019-02-25 DIAGNOSIS — Z902 Acquired absence of lung [part of]: Secondary | ICD-10-CM | POA: Diagnosis not present

## 2019-02-25 NOTE — Patient Instructions (Signed)
Coronavirus (COVID-19) Are you at risk?  Are you at risk for the Coronavirus (COVID-19)?  To be considered HIGH RISK for Coronavirus (COVID-19), you have to meet the following criteria:  . Traveled to [place], [place], [place], [place] or [place]; or in the [place] to [place], [place], [place], or [place]; and have fever, cough, and shortness of breath within the last 2 weeks of travel OR . Been in close contact with a person diagnosed with COVID-19 within the last 2 weeks and have fever, cough, and shortness of breath . IF YOU DO NOT MEET THESE CRITERIA, YOU ARE CONSIDERED LOW RISK FOR COVID-19.  What to do if you are HIGH RISK for COVID-19?  . If you are having a medical emergency, call 911. . Seek medical care right away. Before you go to a doctor's office, urgent care or emergency department, call ahead and tell them about your recent travel, contact with someone diagnosed with COVID-19, and your symptoms. You should receive instructions from your physician's office regarding next steps of care.  . When you arrive at healthcare provider, tell the healthcare staff immediately you have returned from visiting [place], [place], [place], [place] or [place]; or traveled in the [place] to [place], [place], [place], or [place]; in the last two weeks or you have been in close contact with a person diagnosed with COVID-19 in the last 2 weeks.   . Tell the health care staff about your symptoms: fever, cough and shortness of breath. . After you have been seen by a medical provider, you will be either: o Tested for (COVID-19) and discharged home on quarantine except to seek medical care if symptoms worsen, and asked to  - Stay home and avoid contact with others until you get your results (4-5 days)  - Avoid travel on public transportation if possible (such as bus, train, or airplane) or o Sent to the Emergency Department by EMS for evaluation, COVID-19 testing, and possible  admission depending on your condition and test results.  What to do if you are LOW RISK for COVID-19?  Reduce your risk of any infection by using the same precautions used for avoiding the common cold or flu:  . Wash your hands often with soap and warm water for at least 20 seconds.  If soap and water are not readily available, use an alcohol-based hand sanitizer with at least 60% alcohol.  . If coughing or sneezing, cover your mouth and nose by coughing or sneezing into the elbow areas of your shirt or coat, into a tissue or into your sleeve (not your hands). . Avoid shaking hands with others and consider head nods or verbal greetings only. . Avoid touching your eyes, nose, or mouth with unwashed hands.  . Avoid close contact with people who are sick. . Avoid places or events with large numbers of people in one location, like concerts or sporting events. . Carefully consider travel plans you have or are making. . If you are planning any travel outside or inside the [place], visit the CDC's Travelers' Health webpage for the latest health notices. . If you have some symptoms but not all symptoms, continue to monitor at home and seek medical attention if your symptoms worsen. . If you are having a medical emergency, call 911.   ADDITIONAL HEALTHCARE OPTIONS FOR PATIENTS   Telehealth / e-Visit: [email or web address]         [facility]: [phone]  [facility]: [phone]                   [facility]: [phone]   

## 2019-02-25 NOTE — Progress Notes (Signed)
Radiation Oncology         [phone] ________________________________  Initial Outpatient Consultation  Name: William Dyer MRN: [id number]  Date: 02/25/2019  DOB: 1945/06/08  [id number], Lutricia Horsfall, MD  Garner Nash, DO   REFERRING PHYSICIAN: Garner Nash, DO  DIAGNOSIS: The encounter diagnosis was Solitary pulmonary nodule.  Presumptive stage I  bronchogenic neoplasm  History of invasive adenocarcinoma of right lower lobe in 2001.  HISTORY OF PRESENT ILLNESS::William Dyer is a 74 y.o. male who is accompanied by no one due to COVID-19 restrictions. The patient underwent CT scan of chest for lung cancer screening on 10/30/2018. Results showed a suspicious 9 mm nodule in the medial right lung, increased from 3 mm on 10/02/2017 chest CT.  Patient was seen by Dr. Valeta [name] on 11/12/2018, during which time the patient was very anxious about undergoing a potential biopsy secondary to history of severe lung disease. Dr. Valeta [name] recommended electromagnetic navigational bronchoscopy, which the patient declined at that time. He stated that he wanted to wait to have an additional scan done in three months to see the progress of the nodule. He was found to not be a candidate for surgical resection and did not want to have it done due to the significant recovery that it would require following.   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  Patient received no adjuvant treatment after this surgery, presumably early stage disease  PET scan on 02/12/2019 showed 14 mm irregular/spiculated nodule in the medial right upper lobe, highly suspicious for primary bronchogenic neoplasm. No findings suspicious for metastatic disease.   Dr. Valeta [name] spoke with the patient over the phone on 02/14/2019 regarding the PET scan results. The patient continued to have significant concern undergoing  bronchoscopy. Therefore, due to the reservation and risk, the patient was deemed as high-risk.  Patient is now seen in radiation oncology to be considered for definitive treatment without tissue diagnosis.  PREVIOUS RADIATION THERAPY: No  PAST MEDICAL HISTORY:  Past Medical History:  Diagnosis Date  . CAD (coronary artery disease)    Remote PCI; s/p CABG x 3 in Feb 2013  . COPD (chronic obstructive pulmonary disease) ([place])   . Diabetes mellitus   . GERD (gastroesophageal reflux disease)   . Heart murmur   . Hyperlipidemia   . Hypothyroidism   . Lung cancer ([place]) 07/2000   status post right lower lobectomy for adenocarcinoma  . Obesity   . Pneumonia     PAST SURGICAL HISTORY: Past Surgical History:  Procedure Laterality Date  . CORONARY ANGIOPLASTY  1990's  . CORONARY ARTERY BYPASS GRAFT  03/18/2011   Procedure: CORONARY ARTERY BYPASS GRAFTING (CABG);  Surgeon: [name] [name], MD;  Location: [facility];  Service: Open Heart Surgery;  Laterality: N/A;  . INGUINAL HERNIA REPAIR  1960's   "? side"  . LUNG LOBECTOMY     right, lower; "for lung cancer"  . TONSILLECTOMY AND ADENOIDECTOMY  1950's    FAMILY HISTORY:  Family History  Problem Relation Age of Onset  . Emphysema Other   . Lung cancer Other     SOCIAL HISTORY:  Social History   Tobacco Use  . Smoking status: Former Smoker    Packs/day: 1.00    Years: 50.00    Pack years: 50.00    Types: Cigarettes    Quit date: 02/10/2018    Years since  quitting: 1.0  . Smokeless tobacco: Never Used  Substance Use Topics  . Alcohol use: No    Alcohol/week: 0.0 standard drinks  . Drug use: No    ALLERGIES:  Allergies  Allergen Reactions  . Rosuvastatin Calcium     Failed lipitor and Zocor due to muscle aches  . Statins     Failed lipitor and Zocor due to muscle aches  . Sulfonamide Derivatives Other (See Comments)    Patient "drasws up" muscularly    MEDICATIONS:  Current Outpatient Medications  Medication Sig  Dispense Refill  . albuterol (PROAIR HFA) 108 (90 Base) MCG/ACT inhaler Inhale 1-2 puffs into the lungs every 6 (six) hours as needed for wheezing or shortness of breath. 1 Inhaler 6  . Alirocumab (PRALUENT) 75 MG/ML SOAJ Inject 1 pen into the skin every 14 (fourteen) days. 2 pen 11  . aspirin 81 MG tablet Take 81 mg by mouth daily.    . Cholecalciferol 50 MCG (2000 UT) TABS Take 2,000 mg by mouth daily.    . formoterol (PERFOROMIST) 20 MCG/2ML nebulizer solution Take 2 mLs (20 mcg total) by nebulization 2 (two) times daily. 120 mL 5  . guaiFENesin (MUCINEX) 600 MG 12 hr tablet Take 600 mg by mouth 2 (two) times daily as needed for to loosen phlegm. For congestion    . ibuprofen (ADVIL) 600 MG tablet Take by mouth.    . levothyroxine (SYNTHROID, LEVOTHROID) 137 MCG tablet Take 137 mcg by mouth daily.    Marland Kitchen lisinopril-hydrochlorothiazide (PRINZIDE,ZESTORETIC) 10-12.5 MG per tablet 1/2 tab by mouth once daily    . loratadine (CLARITIN) 10 MG tablet Take 10 mg by mouth daily as needed for allergies or rhinitis.     . metFORMIN (GLUCOPHAGE) 1000 MG tablet Take 1,000 mg by mouth 2 (two) times daily with a meal.     . nitroGLYCERIN (NITROSTAT) 0.4 MG SL tablet Place 1 tablet (0.4 mg total) under the tongue every 5 (five) minutes as needed for chest pain (3 doses max). 25 tablet 3  . tamsulosin (FLOMAX) 0.4 MG CAPS capsule Take 0.4 mg by mouth daily after supper.     . Vitamin D, Ergocalciferol, (DRISDOL) 1.25 MG (50000 UT) CAPS capsule Take 50,000 Units by mouth once a week.     No current facility-administered medications for this encounter.    REVIEW OF SYSTEMS:  A 10+ POINT REVIEW OF SYSTEMS WAS OBTAINED including neurology, dermatology, psychiatry, cardiac, respiratory, lymph, extremities, GI, GU, musculoskeletal, constitutional, reproductive, HEENT.  Denies any significant cough or hemoptysis.  He has chronic pain along the right posterior lower rib cage area which he reports related to scar tissue  from his thoracotomy.  He also reports the skin in this area anteriorly is exquisitely sensitive with touch.  He denies any headaches or visual issues except that related to his history of diabetes mellitus   PHYSICAL EXAM:  height is 5' 0.5" (1.537 m) and weight is 219 lb 2 oz (99.4 kg). His temporal temperature is 98.3 F (36.8 C). His blood pressure is 161/71 (abnormal) and his pulse is 75. His respiration is 20 and oxygen saturation is 96%.   Body mass index is 42.09 kg/m. General: Alert and oriented, in no acute distress, supplemental oxygen in place at 2 L HEENT: Head is normocephalic. Extraocular movements are intact.  Neck: Neck is supple, no palpable cervical or supraclavicular lymphadenopathy. Heart: Regular in rate and rhythm with no murmurs, rubs, or gallops. Chest: Clear to auscultation bilaterally, with no  rhonchi, wheezes, or rales.  Patient has a large thoracotomy scar located in the right lower posterior chest region Abdomen: Soft, nontender, nondistended, with no rigidity or guarding. Extremities: No cyanosis or edema. Lymphatics: see Neck Exam Skin: No concerning lesions. Musculoskeletal: symmetric strength and muscle tone throughout. Neurologic: Cranial nerves II through XII are grossly intact. No obvious focalities. Speech is fluent. Coordination is intact. Psychiatric: Judgment and insight are intact. Affect is appropriate.   ECOG = 1  0 - Asymptomatic (Fully active, able to carry on all predisease activities without restriction)  1 - Symptomatic but completely ambulatory (Restricted in physically strenuous activity but ambulatory and able to carry out work of a light or sedentary nature. For example, light housework, office work)  2 - Symptomatic, <50% in bed during the day (Ambulatory and capable of all self care but unable to carry out any work activities. Up and about more than 50% of waking hours)  3 - Symptomatic, >50% in bed, but not bedbound (Capable of only  limited self-care, confined to bed or chair 50% or more of waking hours)  4 - Bedbound (Completely disabled. Cannot carry on any self-care. Totally confined to bed or chair)  5 - Death   Eustace Pen MM, Creech RH, Tormey DC, et al. [phone]). "Toxicity and response criteria of the [facility]". [place] Oncol. 5 (6): 649-55  LABORATORY DATA:  Lab Results  Component Value Date   WBC 16.2 (H) 05/15/2013   HGB 12.1 (L) 05/15/2013   HCT 35.6 (L) 05/15/2013   MCV 92.7 05/15/2013   PLT 223 05/15/2013   NEUTROABS 15.5 (H) 05/12/2013   Lab Results  Component Value Date   NA 133 (L) 04/11/2016   K 4.1 04/11/2016   CL 93 (L) 04/11/2016   CO2 22 04/11/2016   GLUCOSE 120 (H) 04/11/2016   CREATININE 0.94 04/11/2016   CALCIUM 9.4 04/11/2016      RADIOGRAPHY: NM PET Image Initial (PI) Skull Base To Thigh  Result Date: 02/12/2019 CLINICAL DATA:  Initial treatment strategy for medial right upper lobe nodule. History of lung cancer status post right lower lobe wedge resection. EXAM: NUCLEAR MEDICINE PET SKULL BASE TO THIGH TECHNIQUE: 10.5 mCi F-18 FDG was injected intravenously. Full-ring PET imaging was performed from the skull base to thigh after the radiotracer. CT data was obtained and used for attenuation correction and anatomic localization. Fasting blood glucose: 149 mg/dl COMPARISON:  Low-dose lung cancer screening CT chest dated 10/30/2018 FINDINGS: Mediastinal blood pool activity: SUV max 3.2 Liver activity: SUV max NA NECK: No hypermetabolic cervical lymphadenopathy. Mild hypermetabolism along the left vocal fold, without CT abnormality, likely physiologic. Incidental CT findings: none CHEST: 14 mm irregular/spiculated nodule in the medial right upper lobe (series 8/image 21), previously 9 mm. Max SUV 8.0. This appearance is highly suspicious for primary bronchogenic neoplasm. Status post right lower lobe wedge resection. Moderate centrilobular and paraseptal emphysematous  changes, upper lung predominant. No hypermetabolic thoracic lymphadenopathy. Incidental CT findings: none ABDOMEN/PELVIS: No abnormal hypermetabolism in the liver, spleen, pancreas, or adrenal glands. No hypermetabolic abdominopelvic lymphadenopathy. Incidental CT findings: 3.2 cm posterior left renal cyst. Mild layering gallstones versus gallbladder sludge. Atherosclerotic calcifications the abdominal aorta and branch vessels. Prostatomegaly. Thick-walled bladder, sitting chronic bladder obstruction. Tiny fat containing left inguinal hernia. Fat within the bilateral inguinal canals. SKELETON: No focal hypermetabolic activity to suggest skeletal metastasis. Incidental CT findings: Degenerative changes of the visualized thoracolumbar spine. IMPRESSION: 14 mm irregular/spiculated nodule in the medial right  upper lobe, highly suspicious for primary bronchogenic neoplasm. No findings suspicious for metastatic disease. Status post right lower lobe wedge resection. Electronically Signed   By: Julian Hy M.D.   On: 02/12/2019 13:56      IMPRESSION: PET positive solitary pulmonary nodule presenting in the medial right upper lobe.    Given the patient's stage III severe COPD the location of this lesion he would not be a reasonable candidate for CT-guided biopsy.  Anesthesia would also be somewhat tricky with this patient given his medical issues. He has decided against bronchoscopy and potential biopsy given these issues.  Patient's PET scan shows significant uptake in this area consistent with primary bronchogenic carcinoma.  Given these findings, I would offer definitive treatment with stereotactic body radiation therapy without tissue diagnosis.  I discussed the course of treatment side effects and potential toxicities of radiation therapy in this situation with the patient.  He appears to understand and wishes to proceed with treatment.  I did discuss with the patient that in rare situations, patients can  develop significant radiation pneumonitis and pulmonary fibrosis with potentially could be life-threatening given the patient's breathing situation at the current time.  Patient understands this issue and is willing to proceed with radiation therapy given the potential for cure at this early stage.   PLAN: Patient will return on January 25 for stereotactic body radiation therapy planning.  He will begin his treatments approximately a week later.  Anticipate 3 stereotactic body radiation treatments.    ------------------------------------------------  Blair Promise, PhD, MD  This document serves as a record of services personally performed by [name], MD. It was created on his behalf by Clerance Lav, a trained medical scribe. The creation of this record is based on the scribe's personal observations and the provider's statements to them. This document has been checked and approved by the attending provider.

## 2019-03-03 ENCOUNTER — Ambulatory Visit: Payer: Medicare Other | Admitting: Radiation Oncology

## 2019-03-04 ENCOUNTER — Other Ambulatory Visit: Payer: Self-pay

## 2019-03-04 ENCOUNTER — Ambulatory Visit
Admission: RE | Admit: 2019-03-04 | Discharge: 2019-03-04 | Disposition: A | Discharge status: Home / Self Care | Payer: Medicare Other | Source: Ambulatory Visit | Attending: Radiation Oncology | Admitting: Radiation Oncology

## 2019-03-04 DIAGNOSIS — C3401 Malignant neoplasm of right main bronchus: Secondary | ICD-10-CM

## 2019-03-04 DIAGNOSIS — Z510 Encounter for antineoplastic radiation therapy: Secondary | ICD-10-CM | POA: Diagnosis not present

## 2019-03-04 DIAGNOSIS — C3411 Malignant neoplasm of upper lobe, right bronchus or lung: Secondary | ICD-10-CM | POA: Diagnosis present

## 2019-03-04 NOTE — Progress Notes (Addendum)
  Radiation Oncology         (336) [phone] ________________________________  Name: William Dyer MRN: [id number]  Date: 03/04/2019  DOB: [birth year]   STEREOTACTIC BODY RADIOTHERAPY SIMULATION AND TREATMENT PLANNING NOTE    DIAGNOSIS: Presumptive stage I bronchogenic neoplasm presenting in the medial right upper lobe (1.4 cm)  NARRATIVE:  The patient was brought to the [facility].  Identity was confirmed.  All relevant records and images related to the planned course of therapy were reviewed.  The patient freely provided informed written consent to proceed with treatment after reviewing the details related to the planned course of therapy. The consent form was witnessed and verified by the simulation staff.  Then, the patient was set-up in a stable reproducible  supine position for radiation therapy.  A BodyFix immobilization pillow was fabricated for reproducible positioning.  Then I personally applied the abdominal compression paddle to limit respiratory excursion.  4D respiratoy motion management CT images were obtained.  Surface markings were placed.  The CT images were loaded into the planning software.  Then, using Cine, MIP, and standard views, the internal target volume (ITV) and planning target volumes (PTV) were delinieated, and avoidance structures were contoured.  Treatment planning then occurred.  The radiation prescription was entered and confirmed.  A total of two complex treatment devices were fabricated in the form of the BodyFix immobilization pillow and a neck accuform cushion.  I have requested : 3D Simulation  I have requested a DVH of the following structures: Heart, Lungs, Esophagus, Chest Wall, Brachial Plexus, Major Blood Vessels, and targets.  PLAN:  The patient will receive 54 Gy in 3 fractions.  -----------------------------------  Blair Promise, PhD, MD  This document serves as a record of services personally performed by [name], MD. It was  created on his behalf by Clerance Lav, a trained medical scribe. The creation of this record is based on the scribe's personal observations and the provider's statements to them. This document has been checked and approved by the attending provider.

## 2019-03-10 DIAGNOSIS — Z510 Encounter for antineoplastic radiation therapy: Secondary | ICD-10-CM | POA: Diagnosis present

## 2019-03-10 DIAGNOSIS — C3411 Malignant neoplasm of upper lobe, right bronchus or lung: Secondary | ICD-10-CM | POA: Diagnosis present

## 2019-03-11 NOTE — Progress Notes (Signed)
  Radiation Oncology         (336) [phone] ________________________________  Name: William Dyer MRN: [id number]  Date: 03/12/2019  DOB: [birth year]  Stereotactic Body Radiotherapy Treatment Procedure Note  NARRATIVE:  William Dyer was brought to the stereotactic radiation treatment machine and placed supine on the CT couch. The patient was set up for stereotactic body radiotherapy on the body fix pillow.  3D TREATMENT PLANNING AND DOSIMETRY:  The patient's radiation plan was reviewed and approved prior to starting treatment.  It showed 3-dimensional radiation distributions overlaid onto the planning CT.  The [facility] for the target structures as well as the organs at risk were reviewed. The documentation of this is filed in the radiation oncology EMR.  SIMULATION VERIFICATION:  The patient underwent CT imaging on the treatment unit.  These were carefully aligned to document that the ablative radiation dose would cover the target volume and maximally spare the nearby organs at risk according to the planned distribution.  SPECIAL TREATMENT PROCEDURE: William Dyer received high dose ablative stereotactic body radiotherapy to the planned target volume without unforeseen complications. Treatment was delivered uneventfully. The high doses associated with stereotactic body radiotherapy and the significant potential risks require careful treatment set up and patient monitoring constituting a special treatment procedure   STEREOTACTIC TREATMENT MANAGEMENT:  Following delivery, the patient was evaluated clinically. The patient tolerated treatment without significant acute effects, and was discharged to home in stable condition.    PLAN: Continue treatment as planned.  ________________________________  Blair Promise, PhD, MD  This document serves as a record of services personally performed by [name], MD. It was created on his behalf by Clerance Lav, a trained medical scribe. The creation of this  record is based on the scribe's personal observations and the provider's statements to them. This document has been checked and approved by the attending provider.

## 2019-03-12 ENCOUNTER — Ambulatory Visit
Admission: RE | Admit: 2019-03-12 | Discharge: 2019-03-12 | Disposition: A | Discharge status: Home / Self Care | Payer: Medicare Other | Source: Ambulatory Visit | Attending: Radiation Oncology | Admitting: Radiation Oncology

## 2019-03-12 ENCOUNTER — Other Ambulatory Visit: Payer: Medicare Other

## 2019-03-12 ENCOUNTER — Other Ambulatory Visit: Payer: Self-pay

## 2019-03-12 DIAGNOSIS — C3411 Malignant neoplasm of upper lobe, right bronchus or lung: Secondary | ICD-10-CM | POA: Insufficient documentation

## 2019-03-12 DIAGNOSIS — Z510 Encounter for antineoplastic radiation therapy: Secondary | ICD-10-CM | POA: Diagnosis present

## 2019-03-12 DIAGNOSIS — R911 Solitary pulmonary nodule: Secondary | ICD-10-CM

## 2019-03-14 ENCOUNTER — Other Ambulatory Visit: Payer: Self-pay

## 2019-03-14 ENCOUNTER — Ambulatory Visit
Admission: RE | Admit: 2019-03-14 | Discharge: 2019-03-14 | Disposition: A | Discharge status: Home / Self Care | Payer: Medicare Other | Source: Ambulatory Visit | Attending: Radiation Oncology | Admitting: Radiation Oncology

## 2019-03-14 DIAGNOSIS — Z510 Encounter for antineoplastic radiation therapy: Secondary | ICD-10-CM | POA: Diagnosis not present

## 2019-03-14 DIAGNOSIS — C3401 Malignant neoplasm of right main bronchus: Secondary | ICD-10-CM

## 2019-03-14 NOTE — Progress Notes (Signed)
  Radiation Oncology         [phone]) [phone] ________________________________  Name: William Dyer MRN: [id number]  Date: 03/14/2019  DOB: 25-Mar-1945  Stereotactic Body Radiotherapy Treatment Procedure Note  NARRATIVE:  William Dyer was brought to the stereotactic radiation treatment machine and placed supine on the CT couch. The patient was set up for stereotactic body radiotherapy on the body fix pillow.  3D TREATMENT PLANNING AND DOSIMETRY:  The patient's radiation plan was reviewed and approved prior to starting treatment.  It showed 3-dimensional radiation distributions overlaid onto the planning CT.  The [facility] for the target structures as well as the organs at risk were reviewed. The documentation of this is filed in the radiation oncology EMR.  SIMULATION VERIFICATION:  The patient underwent CT imaging on the treatment unit.  These were carefully aligned to document that the ablative radiation dose would cover the target volume and maximally spare the nearby organs at risk according to the planned distribution.  SPECIAL TREATMENT PROCEDURE: William Dyer received high dose ablative stereotactic body radiotherapy to the planned target volume without unforeseen complications. Treatment was delivered uneventfully. The high doses associated with stereotactic body radiotherapy and the significant potential risks require careful treatment set up and patient monitoring constituting a special treatment procedure   STEREOTACTIC TREATMENT MANAGEMENT:  Following delivery, the patient was evaluated clinically. The patient tolerated treatment without significant acute effects, and was discharged to home in stable condition.    PLAN: Continue treatment as planned.  ________________________________  Blair Promise, PhD, MD

## 2019-03-18 NOTE — Progress Notes (Signed)
  Radiation Oncology         (336) [phone] ________________________________  Name: William Dyer MRN: [id number]  Date: 03/19/2019  DOB: 15-Jun-1945  Stereotactic Body Radiotherapy Treatment Procedure Note  NARRATIVE:  William Dyer was brought to the stereotactic radiation treatment machine and placed supine on the CT couch. The patient was set up for stereotactic body radiotherapy on the body fix pillow.  3D TREATMENT PLANNING AND DOSIMETRY:  The patient's radiation plan was reviewed and approved prior to starting treatment.  It showed 3-dimensional radiation distributions overlaid onto the planning CT.  The [facility] for the target structures as well as the organs at risk were reviewed. The documentation of this is filed in the radiation oncology EMR.  SIMULATION VERIFICATION:  The patient underwent CT imaging on the treatment unit.  These were carefully aligned to document that the ablative radiation dose would cover the target volume and maximally spare the nearby organs at risk according to the planned distribution.  SPECIAL TREATMENT PROCEDURE: William Dyer received high dose ablative stereotactic body radiotherapy to the planned target volume without unforeseen complications. Treatment was delivered uneventfully. The high doses associated with stereotactic body radiotherapy and the significant potential risks require careful treatment set up and patient monitoring constituting a special treatment procedure   STEREOTACTIC TREATMENT MANAGEMENT:  Following delivery, the patient was evaluated clinically. The patient tolerated treatment without significant acute effects, and was discharged to home in stable condition.    PLAN: Continue treatment as planned.  ________________________________  Blair Promise, PhD, MD  This document serves as a record of services personally performed by [name], MD. It was created on his behalf by Clerance Lav, a trained medical scribe. The creation of this  record is based on the scribe's personal observations and the provider's statements to them. This document has been checked and approved by the attending provider.

## 2019-03-19 ENCOUNTER — Other Ambulatory Visit: Payer: Self-pay

## 2019-03-19 ENCOUNTER — Encounter: Payer: Self-pay | Admitting: Radiation Oncology

## 2019-03-19 ENCOUNTER — Ambulatory Visit
Admission: RE | Admit: 2019-03-19 | Discharge: 2019-03-19 | Disposition: A | Discharge status: Home / Self Care | Payer: Medicare Other | Source: Ambulatory Visit | Attending: Radiation Oncology | Admitting: Radiation Oncology

## 2019-03-19 DIAGNOSIS — Z510 Encounter for antineoplastic radiation therapy: Secondary | ICD-10-CM | POA: Diagnosis not present

## 2019-03-19 DIAGNOSIS — Z923 Personal history of irradiation: Secondary | ICD-10-CM

## 2019-03-19 DIAGNOSIS — R911 Solitary pulmonary nodule: Secondary | ICD-10-CM

## 2019-03-19 HISTORY — DX: Personal history of irradiation: Z92.3

## 2019-03-25 ENCOUNTER — Other Ambulatory Visit: Payer: Medicare Other

## 2019-03-27 ENCOUNTER — Other Ambulatory Visit: Payer: Self-pay

## 2019-03-27 ENCOUNTER — Other Ambulatory Visit: Payer: Medicare Other | Admitting: *Deleted

## 2019-03-27 DIAGNOSIS — E782 Mixed hyperlipidemia: Secondary | ICD-10-CM

## 2019-03-28 ENCOUNTER — Other Ambulatory Visit: Payer: Medicare Other

## 2019-03-28 LAB — HEPATIC FUNCTION PANEL
ALT: 14 IU/L (ref 0–44)
AST: 12 IU/L (ref 0–40)
Albumin: 4.4 g/dL (ref 3.7–4.7)
Alkaline Phosphatase: 50 IU/L (ref 39–117)
Bilirubin Total: 0.8 mg/dL (ref 0.0–1.2)
Bilirubin, Direct: 0.18 mg/dL (ref 0.00–0.40)
Total Protein: 6.8 g/dL (ref 6.0–8.5)

## 2019-03-28 LAB — LIPID PANEL
Chol/HDL Ratio: 3.4 ratio (ref 0.0–5.0)
Cholesterol, Total: 201 mg/dL — ABNORMAL HIGH (ref 100–199)
HDL: 60 mg/dL (ref >39)
LDL Chol Calc (NIH): 93 mg/dL (ref 0–99)
Triglycerides: 287 mg/dL — ABNORMAL HIGH (ref 0–149)
VLDL Cholesterol Cal: 48 mg/dL — ABNORMAL HIGH (ref 5–40)

## 2019-04-08 NOTE — Progress Notes (Incomplete)
  Patient Name: William Dyer MRN: [id number] DOB: 1945/12/07 Referring Physician: Ernestene Kiel Date of Service: 03/19/2019 [facility], [place]                                                        End Of Treatment Note  Diagnoses: R91.1-Solitary pulmonary nodule  Cancer Staging: Presumptive stage I bronchogenic neoplasm presenting in the medial right upper lobe (1.4 cm)  Intent: Curative  Radiation Treatment Dates: 03/12/2019 through 03/19/2019 Site Technique Total Dose (Gy) Dose per Fx (Gy) Completed Fx Beam Energies  Lung, Right: Lung_Rt IMRT 54/54 18 3/3 6XFFF   Narrative: The patient tolerated radiation therapy relatively well. He noted some slight pain in his right lower back, but had no other complaints. The patient was advised to monitor himself for fatigue due to the amount of radiation that he received in such a short time frame.   Plan: The patient will follow-up with radiation oncology in one month.  ________________________________________________   Blair Promise, PhD, MD  This document serves as a record of services personally performed by [name], MD. It was created on his behalf by Clerance Lav, a trained medical scribe. The creation of this record is based on the scribe's personal observations and the provider's statements to them. This document has been checked and approved by the attending provider.

## 2019-04-18 ENCOUNTER — Other Ambulatory Visit: Payer: Self-pay

## 2019-04-18 ENCOUNTER — Ambulatory Visit
Admission: RE | Admit: 2019-04-18 | Discharge: 2019-04-18 | Disposition: A | Discharge status: Home / Self Care | Payer: Medicare Other | Source: Ambulatory Visit | Attending: Radiation Oncology | Admitting: Radiation Oncology

## 2019-04-18 ENCOUNTER — Encounter: Payer: Self-pay | Admitting: Radiation Oncology

## 2019-04-18 VITALS — BP 149/76 | HR 78 | Temp 98.5°F | Resp 18 | Ht 64.0 in | Wt 214.2 lb

## 2019-04-18 DIAGNOSIS — Z7982 Long term (current) use of aspirin: Secondary | ICD-10-CM | POA: Insufficient documentation

## 2019-04-18 DIAGNOSIS — Z79899 Other long term (current) drug therapy: Secondary | ICD-10-CM | POA: Insufficient documentation

## 2019-04-18 DIAGNOSIS — C3401 Malignant neoplasm of right main bronchus: Secondary | ICD-10-CM | POA: Insufficient documentation

## 2019-04-18 DIAGNOSIS — M549 Dorsalgia, unspecified: Secondary | ICD-10-CM | POA: Diagnosis not present

## 2019-04-18 DIAGNOSIS — Z923 Personal history of irradiation: Secondary | ICD-10-CM | POA: Insufficient documentation

## 2019-04-18 DIAGNOSIS — G8929 Other chronic pain: Secondary | ICD-10-CM | POA: Insufficient documentation

## 2019-04-18 DIAGNOSIS — Z7984 Long term (current) use of oral hypoglycemic drugs: Secondary | ICD-10-CM | POA: Diagnosis not present

## 2019-04-18 NOTE — Patient Instructions (Signed)
Coronavirus (COVID-19) Are you at risk?  Are you at risk for the Coronavirus (COVID-19)?  To be considered HIGH RISK for Coronavirus (COVID-19), you have to meet the following criteria:  . Traveled to [place], [place], [place], [place] or [place]; or in the [place] to [place], [place], [place], or [place]; and have fever, cough, and shortness of breath within the last 2 weeks of travel OR . Been in close contact with a person diagnosed with COVID-19 within the last 2 weeks and have fever, cough, and shortness of breath . IF YOU DO NOT MEET THESE CRITERIA, YOU ARE CONSIDERED LOW RISK FOR COVID-19.  What to do if you are HIGH RISK for COVID-19?  . If you are having a medical emergency, call 911. . Seek medical care right away. Before you go to a doctor's office, urgent care or emergency department, call ahead and tell them about your recent travel, contact with someone diagnosed with COVID-19, and your symptoms. You should receive instructions from your physician's office regarding next steps of care.  . When you arrive at healthcare provider, tell the healthcare staff immediately you have returned from visiting [place], [place], [place], [place] or [place]; or traveled in the [place] to [place], [place], [place], or [place]; in the last two weeks or you have been in close contact with a person diagnosed with COVID-19 in the last 2 weeks.   . Tell the health care staff about your symptoms: fever, cough and shortness of breath. . After you have been seen by a medical provider, you will be either: o Tested for (COVID-19) and discharged home on quarantine except to seek medical care if symptoms worsen, and asked to  - Stay home and avoid contact with others until you get your results (4-5 days)  - Avoid travel on public transportation if possible (such as bus, train, or airplane) or o Sent to the Emergency Department by EMS for evaluation, COVID-19 testing, and possible  admission depending on your condition and test results.  What to do if you are LOW RISK for COVID-19?  Reduce your risk of any infection by using the same precautions used for avoiding the common cold or flu:  . Wash your hands often with soap and warm water for at least 20 seconds.  If soap and water are not readily available, use an alcohol-based hand sanitizer with at least 60% alcohol.  . If coughing or sneezing, cover your mouth and nose by coughing or sneezing into the elbow areas of your shirt or coat, into a tissue or into your sleeve (not your hands). . Avoid shaking hands with others and consider head nods or verbal greetings only. . Avoid touching your eyes, nose, or mouth with unwashed hands.  . Avoid close contact with people who are sick. . Avoid places or events with large numbers of people in one location, like concerts or sporting events. . Carefully consider travel plans you have or are making. . If you are planning any travel outside or inside the [place], visit the CDC's Travelers' Health webpage for the latest health notices. . If you have some symptoms but not all symptoms, continue to monitor at home and seek medical attention if your symptoms worsen. . If you are having a medical emergency, call 911.   ADDITIONAL HEALTHCARE OPTIONS FOR PATIENTS  [facility] / e-Visit: [email or web address]         [facility]: [phone]  [facility]: [phone]                   [facility]: [phone]   

## 2019-04-18 NOTE — Progress Notes (Signed)
William Dyer presents today for f/u with Dr. [name]. Pt with multiple c/o pain related to back and legs. Pt states he is planning to make an appt with "a back doctor to do the needle work in my back". Pt reports SOB attributed to COPD. Pt denies cough, hemoptysis. Pt denies difficulty swallowing.   BP (!) 149/76 (BP Location: Right Arm, Patient Position: Sitting)   Pulse 78   Temp 98.5 F (36.9 C) (Temporal)   Resp 18   Ht 5\' 4"  (1.626 m)   Wt 214 lb 4 oz (97.2 kg)   SpO2 93%   BMI 36.78 kg/m   Wt Readings from Last 3 Encounters:  04/18/19 214 lb 4 oz (97.2 kg)  02/25/19 219 lb 2 oz (99.4 kg)  01/28/19 215 lb 3.2 oz (97.6 kg)   Loma Sousa, RN BSN

## 2019-04-18 NOTE — Progress Notes (Signed)
Radiation Oncology         [phone] ________________________________  Name: William Dyer MRN: [id number]  Date: 04/18/2019  DOB: Aug 01, 1945  Follow-Up Visit Note  CC: [name]., MD  Langston Masker Lutricia Horsfall, MD    ICD-10-CM   1. 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  He continues to have chronic back pain considering an additional follow-up for this issue.  The dyspnea with exertion related to his COPD.  He denies any worsening of his breathing since radiation.  He uses oxygen at night to use with his CPAP none during the day.  He denies any unusual coughing    ALLERGIES:  is allergic to rosuvastatin calcium; statins; and sulfonamide derivatives.  Meds: Current Outpatient Medications  Medication Sig Dispense Refill  . albuterol (PROAIR HFA) 108 (90 Base) MCG/ACT inhaler Inhale 1-2 puffs into the lungs every 6 (six) hours as needed for wheezing or shortness of breath. 1 Inhaler 6  . Alirocumab (PRALUENT) 75 MG/ML SOAJ Inject 1 pen into the skin every 14 (fourteen) days. 2 pen 11  . aspirin 81 MG tablet Take 81 mg by mouth daily.    . Cholecalciferol 50 MCG (2000 UT) TABS Take 2,000 mg by mouth daily.    . formoterol (PERFOROMIST) 20 MCG/2ML nebulizer solution Take 2 mLs (20 mcg total) by nebulization 2 (two) times daily. 120 mL 5  . guaiFENesin (MUCINEX) 600 MG 12 hr tablet Take 600 mg by mouth 2 (two) times daily as needed for to loosen phlegm. For congestion    . ibuprofen (ADVIL) 600 MG tablet Take by mouth.    . levothyroxine (SYNTHROID, LEVOTHROID) 137 MCG tablet Take 137  mcg by mouth daily.    Marland Kitchen lisinopril-hydrochlorothiazide (PRINZIDE,ZESTORETIC) 10-12.5 MG per tablet 1/2 tab by mouth once daily    . loratadine (CLARITIN) 10 MG tablet Take 10 mg by mouth daily as needed for allergies or rhinitis.     . metFORMIN (GLUCOPHAGE) 1000 MG tablet Take 1,000 mg by mouth 2 (two) times daily with a meal.     . nitroGLYCERIN (NITROSTAT) 0.4 MG SL tablet Place 1 tablet (0.4 mg total) under the tongue every 5 (five) minutes as needed for chest pain (3 doses max). 25 tablet 3  . pregabalin (LYRICA) 75 MG capsule Take by mouth.    . tamsulosin (FLOMAX) 0.4 MG CAPS capsule Take 0.4 mg by mouth daily after supper.     . Vitamin D, Ergocalciferol, (DRISDOL) 1.25 MG (50000 UT) CAPS capsule Take 50,000 Units by mouth once a week.     No current facility-administered medications for this encounter.    Physical Findings: The patient is in no acute distress. Patient is alert and oriented.  height is 5\' 4"  (1.626 m) and weight is 214 lb 4 oz (97.2 kg). His temporal temperature is 98.5 F (36.9 C). His blood pressure is 149/76 (abnormal) and his pulse is 78. His respiration is 18 and oxygen saturation is 93%. .  No significant changes. Lungs are clear to auscultation bilaterally. Heart has regular rate and rhythm. No palpable  cervical, supraclavicular, or axillary adenopathy. Abdomen soft, non-tender, normal bowel sounds.   Lab Findings: Lab Results  Component Value Date   WBC 16.2 (H) 05/15/2013   HGB 12.1 (L) 05/15/2013   HCT 35.6 (L) 05/15/2013   MCV 92.7 05/15/2013   PLT 223 05/15/2013    Radiographic Findings: No results found.  Impression:  Presumptive stage I bronchogenic neoplasmpresenting in the medial right upper lobe(1.4 cm)  The patient is recovering from the effects of radiation.  Overall seemed to tolerate his SBRT well  Plan: CT scan of the chest in 3 months and then follow-up soon afterward.  ____________________________________ [name],  MD    This document serves as a record of services personally performed by [name], MD. It was created on his behalf by Wilburn Mylar, a trained medical scribe. The creation of this record is based on the scribe's personal observations and the provider's statements to them. This document has been checked and approved by the attending provider.

## 2019-05-27 ENCOUNTER — Other Ambulatory Visit: Payer: Self-pay | Admitting: Pulmonary Disease

## 2019-05-27 MED ORDER — ALBUTEROL SULFATE HFA 108 (90 BASE) MCG/ACT IN AERS: 1.0000 | INHALATION_SPRAY | Freq: Four times a day (QID) | RESPIRATORY_TRACT | 0 refills | Status: DC | PRN

## 2019-05-27 NOTE — Telephone Encounter (Signed)
Called patient to verify meds He states Bretizi was not helpful therefore decided to stay on neb meds. Also requested refill on ProAir. Scheduled f/u 4/29 with BI.

## 2019-06-06 ENCOUNTER — Other Ambulatory Visit: Payer: Self-pay

## 2019-06-06 ENCOUNTER — Encounter: Payer: Self-pay | Admitting: Pulmonary Disease

## 2019-06-06 ENCOUNTER — Ambulatory Visit: Payer: Medicare Other | Admitting: Pulmonary Disease

## 2019-06-06 VITALS — BP 122/80 | HR 86 | Temp 98.4°F | Ht 64.0 in | Wt 217.0 lb

## 2019-06-06 DIAGNOSIS — J432 Centrilobular emphysema: Secondary | ICD-10-CM

## 2019-06-06 DIAGNOSIS — G4733 Obstructive sleep apnea (adult) (pediatric): Secondary | ICD-10-CM | POA: Diagnosis not present

## 2019-06-06 DIAGNOSIS — C3401 Malignant neoplasm of right main bronchus: Secondary | ICD-10-CM

## 2019-06-06 DIAGNOSIS — J9611 Chronic respiratory failure with hypoxia: Secondary | ICD-10-CM | POA: Diagnosis not present

## 2019-06-06 NOTE — Assessment & Plan Note (Signed)
Plan: Continue Perforomist nebulized meds twice daily Continue budesonide nebulized meds twice daily Continue albuterol as needed

## 2019-06-06 NOTE — Assessment & Plan Note (Signed)
Plan: Continue oxygen therapy as prescribed Maintain oxygen saturations above 88% Notify our office if you are needing increased amounts of oxygen

## 2019-06-06 NOTE — Assessment & Plan Note (Signed)
Maintained on CPAP Needs to be established with a sleep MD  Plan: Continue CPAP Establish with sleep MD

## 2019-06-06 NOTE — Progress Notes (Signed)
'@Patient'  ID: William Dyer, male    DOB: Jul 20, 1945, 74 y.o.   MRN: [id number]  Chief Complaint  Patient presents with  . Follow-up    6 month f/u.     Referring provider: [name]., MD  HPI:  74 year old male former smoker followed in our office for COPD, suspected lung cancer  PMH: CAD, hyperlipidemia, hypothyroidism, GERD, type 2 diabetes, Smoker/ Smoking History: Former smoker.  Quit January/2020.  50-pack-year smoking history. Maintenance:  [name], Budesonide  Pt of: Dr. Valeta [name]  06/06/2019  - Visit   Patient was last seen here in October/2020 by Dr. Valeta [name].  Was decided at that point in time that the patient declined a bronchoscopy for tissue diagnosis but was recommended to complete SBRT.  Also to have a repeat PET scan in January/2021.  PET scan in January/2021 shows PET avid right upper lobe nodule concerning for primary bronchogenic carcinoma.  He was referred to radiation oncology at that time patient was established with Dr. [name] with radiation oncology in January/2021.  He was last seen by Dr. [name] in March/2021 plan from that visit was to repeat a CT of his chest in 3 months tentatively around June/2021.  Patient presenting to our office today as a 50-monthfollow-up.  He reports that his breathing is stable.  He has plans to repeat a CT as outlined by radiation oncology in June/2021  Patient is currently struggling with ongoing back pain.  This is been managed by primary care, neurology as well as surgeon.  He was not felt to be a surgical candidate.  Neurology is offered multiple suggestions for management.  Patient is not pleased with any of the options.  He is not interested in taking chronic pain medications.  He reports that sometimes his back pain affects his breathing.  Questionaires / Pulmonary Flowsheets:   MMRC: mMRC Dyspnea Scale mMRC Score  06/06/2019 2    Tests:   September 2020 lung cancer screening CT: Lung RADS 4B, suspicious 9 mm nodule  within the medial right lung increased in size from 3 mm from previous imaging.  02/12/2019-PET scan-14 mm irregular spiculated nodule in medial right upper lobe, highly suspicious for primary bronchogenic neoplasm  FENO:  No results found for: NITRICOXIDE  PFT: PFT Results Latest Ref Rng & Units 04/30/2015  FVC-Pre L 1.85  FVC-Predicted Pre % 54  FVC-Post L 2.20  FVC-Predicted Post % 64  Pre FEV1/FVC % % 56  Post FEV1/FCV % % 57  FEV1-Pre L 1.03  FEV1-Predicted Pre % 41  FEV1-Post L 1.26  DLCO UNC% % 54  DLCO COR %Predicted % 83    WALK:  SIX MIN WALK 04/08/2011  Supplimental Oxygen during Test? (L/min) No  Tech Comments: test completed without difficulty/mmr    Imaging: No results found.  Lab Results:  CBC    Component Value Date/Time   WBC 16.2 (H) 05/15/2013 0404   RBC 3.84 (L) 05/15/2013 0404   HGB 12.1 (L) 05/15/2013 0404   HCT 35.6 (L) 05/15/2013 0404   PLT 223 05/15/2013 0404   MCV 92.7 05/15/2013 0404   MCH 31.5 05/15/2013 0404   MCHC 34.0 05/15/2013 0404   RDW 14.4 05/15/2013 0404   LYMPHSABS 1.5 05/12/2013 2249   MONOABS 1.6 (H) 05/12/2013 2249   EOSABS 0.0 05/12/2013 2249   BASOSABS 0.0 05/12/2013 2249    BMET    Component Value Date/Time   NA 133 (L) 04/11/2016 0915   K 4.1 04/11/2016 0915  CL 93 (L) 04/11/2016 0915   CO2 22 04/11/2016 0915   GLUCOSE 120 (H) 04/11/2016 0915   GLUCOSE 181 (H) 05/15/2013 0404   BUN 20 04/11/2016 0915   CREATININE 0.94 04/11/2016 0915   CALCIUM 9.4 04/11/2016 0915   GFRNONAA 81 04/11/2016 0915   GFRAA 94 04/11/2016 0915    BNP No results found for: BNP  ProBNP    Component Value Date/Time   PROBNP 166.2 (H) 05/13/2013 0052    Specialty Problems      Pulmonary Problems   Lung cancer ([place])    status post right lower lobectomy for adenocarcinoma      COPD (chronic obstructive pulmonary disease) (HCC)    PFT's 2009:  FEV1 1.73 (63%), ratio 55, DLCO 50% Oxygen 1liter at HS only  Urinary issues  with spiriva, can't afford tudorza Trial of spiriva respimat 08/2013 >> still with urinary retention issues March 2017 pulmonary function testing: Ratio 57%, FEV1 1.26 L (50% predicted, 22% change with bronchodilator which is greater than 200 mL), FVC 2.20 L (64% predicted), DLCO 13.2 (54% predicted).      COPD with acute exacerbation (HCC)   Chronic respiratory failure (HCC)   OSA (obstructive sleep apnea)    11/2014 Sleep study> AHI 5.17 March 2015 started on VPAP 16/12 with 2 L oxygen      Solitary pulmonary nodule      Allergies  Allergen Reactions  . Rosuvastatin Calcium     Failed lipitor and Zocor due to muscle aches  . Statins     Failed lipitor and Zocor due to muscle aches  . Sulfonamide Derivatives Other (See Comments)    Patient "drasws up" muscularly    Immunization History  Administered Date(s) Administered  . Influenza Split 10/25/2010, 11/08/2011, 10/22/2014  . Influenza Whole 11/07/2009  . Influenza, High Dose Seasonal PF 10/28/2015, 10/24/2016, 10/28/2017, 10/25/2018  . Influenza, Quadrivalent, Recombinant, Inj, Pf 10/25/2018  . Influenza,inj,Quad PF,6+ Mos 11/07/2012  . Influenza-Unspecified 10/25/2010, 11/08/2011, 11/07/2012, 11/06/2013, 10/22/2014  . Pneumococcal Conjugate-13 02/10/2012, 11/06/2013  . Pneumococcal Polysaccharide-23 02/06/2007, 02/10/2012, 11/12/2018    Past Medical History:  Diagnosis Date  . CAD (coronary artery disease)    Remote PCI; s/p CABG x 3 in Feb 2013  . COPD (chronic obstructive pulmonary disease) ([place])   . Diabetes mellitus   . GERD (gastroesophageal reflux disease)   . Heart murmur   . Hyperlipidemia   . Hypothyroidism   . Lung cancer ([place]) 07/2000   status post right lower lobectomy for adenocarcinoma  . Obesity   . Pneumonia     Tobacco History: Social History   Tobacco Use  Smoking Status Former Smoker  . Packs/day: 1.00  . Years: 50.00  . Pack years: 50.00  . Types: Cigarettes  . Quit date: 02/10/2018    . Years since quitting: 1.3  Smokeless Tobacco Never Used   Counseling given: Yes  Continue to not smoke  Outpatient Encounter Medications as of 06/06/2019  Medication Sig  . albuterol (PROAIR HFA) 108 (90 Base) MCG/ACT inhaler Inhale 1-2 puffs into the lungs every 6 (six) hours as needed for wheezing or shortness of breath.  . Alirocumab (PRALUENT) 75 MG/ML SOAJ Inject 1 pen into the skin every 14 (fourteen) days.  Marland Kitchen aspirin 81 MG tablet Take 81 mg by mouth daily.  . Cholecalciferol 50 MCG (2000 UT) TABS Take 2,000 mg by mouth daily.  . formoterol (PERFOROMIST) 20 MCG/2ML nebulizer solution Take 2 mLs (20 mcg total) by nebulization 2 (two) times  daily. Dx:J43.2  . guaiFENesin (MUCINEX) 600 MG 12 hr tablet Take 600 mg by mouth 2 (two) times daily as needed for to loosen phlegm. For congestion  . ibuprofen (ADVIL) 600 MG tablet Take by mouth.  . levothyroxine (SYNTHROID, LEVOTHROID) 137 MCG tablet Take 137 mcg by mouth daily.  Marland Kitchen lisinopril-hydrochlorothiazide (PRINZIDE,ZESTORETIC) 10-12.5 MG per tablet 1/2 tab by mouth once daily  . loratadine (CLARITIN) 10 MG tablet Take 10 mg by mouth daily as needed for allergies or rhinitis.   . metFORMIN (GLUCOPHAGE) 1000 MG tablet Take 1,000 mg by mouth 2 (two) times daily with a meal.   . nitroGLYCERIN (NITROSTAT) 0.4 MG SL tablet Place 1 tablet (0.4 mg total) under the tongue every 5 (five) minutes as needed for chest pain (3 doses max).  . pregabalin (LYRICA) 150 MG capsule Take 150 mg by mouth 2 (two) times daily.  . tamsulosin (FLOMAX) 0.4 MG CAPS capsule Take 0.4 mg by mouth daily after supper.   . Vitamin D, Ergocalciferol, (DRISDOL) 1.25 MG (50000 UT) CAPS capsule Take 50,000 Units by mouth once a week.  . [DISCONTINUED] pregabalin (LYRICA) 75 MG capsule Take by mouth.   No facility-administered encounter medications on file as of 06/06/2019.     Review of Systems  Review of Systems  Constitutional: Positive for fatigue. Negative for  activity change, chills, fever and unexpected weight change.  HENT: Negative for postnasal drip, rhinorrhea, sinus pressure, sinus pain and sore throat.   Eyes: Negative.   Respiratory: Positive for shortness of breath. Negative for cough and wheezing.   Cardiovascular: Negative for chest pain and palpitations.  Gastrointestinal: Negative for constipation, diarrhea, nausea and vomiting.  Endocrine: Negative.   Genitourinary: Negative.   Musculoskeletal: Positive for back pain.  Skin: Negative.   Neurological: Negative for dizziness and headaches.  Psychiatric/Behavioral: Negative.  Negative for dysphoric mood. The patient is not nervous/anxious.   All other systems reviewed and are negative.    Physical Exam  BP 122/80   Pulse 86   Temp 98.4 F (36.9 C) (Temporal)   Ht '5\' 4"'  (1.626 m)   Wt 217 lb (98.4 kg)   SpO2 97% Comment: on 3L  BMI 37.25 kg/m   Wt Readings from Last 5 Encounters:  06/06/19 217 lb (98.4 kg)  04/18/19 214 lb 4 oz (97.2 kg)  02/25/19 219 lb 2 oz (99.4 kg)  01/28/19 215 lb 3.2 oz (97.6 kg)  12/24/18 206 lb (93.4 kg)    BMI Readings from Last 5 Encounters:  06/06/19 37.25 kg/m  04/18/19 36.78 kg/m  02/25/19 42.09 kg/m  01/28/19 37.52 kg/m  12/24/18 36.49 kg/m    Physical Exam Vitals and nursing note reviewed.  Constitutional:      General: He is not in acute distress.    Appearance: Normal appearance. He is obese.  HENT:     Head: Normocephalic and atraumatic.     Right Ear: Hearing and external ear normal.     Left Ear: Hearing and external ear normal.     Nose: Nose normal. No mucosal edema or rhinorrhea.     Right Turbinates: Not enlarged.     Left Turbinates: Not enlarged.     Mouth/Throat:     Mouth: Mucous membranes are dry.     Pharynx: Oropharynx is clear. No oropharyngeal exudate.  Eyes:     Pupils: Pupils are equal, round, and reactive to light.  Cardiovascular:     Rate and Rhythm: Normal rate and regular rhythm.  Pulses: Normal pulses.     Heart sounds: Normal heart sounds. No murmur.  Pulmonary:     Effort: Pulmonary effort is normal.     Breath sounds: No decreased breath sounds, wheezing or rales.     Comments: Diminished breath sounds Musculoskeletal:     Cervical back: Normal range of motion.     Right lower leg: No edema.     Left lower leg: No edema.  Lymphadenopathy:     Cervical: No cervical adenopathy.  Skin:    General: Skin is warm and dry.     Capillary Refill: Capillary refill takes less than 2 seconds.     Findings: No erythema or rash.  Neurological:     General: No focal deficit present.     Mental Status: He is alert and oriented to person, place, and time.     Motor: No weakness.     Coordination: Coordination normal.     Gait: Gait is intact. Gait normal.  Psychiatric:        Mood and Affect: Mood normal.        Behavior: Behavior normal. Behavior is cooperative.        Thought Content: Thought content normal.        Judgment: Judgment normal.     Assessment & Plan:   Chronic respiratory failure Plan: Continue oxygen therapy as prescribed Maintain oxygen saturations above 88% Notify our office if you are needing increased amounts of oxygen  COPD (chronic obstructive pulmonary disease) (HCC) Plan: Continue Perforomist nebulized meds twice daily Continue budesonide nebulized meds twice daily Continue albuterol as needed  Lung cancer Status post SBRT Plans to repeat CT in June/2021  Plan: Keep follow-up with radiation oncology Complete CT as ordered in June/2021  OSA (obstructive sleep apnea) Maintained on CPAP Needs to be established with a sleep MD  Plan: Continue CPAP Establish with sleep MD    Return in about 6 months (around 12/06/2019), or if symptoms worsen or fail to improve, for Follow up with Dr. Valeta [name].   Lauraine Rinne, NP 06/06/2019   This appointment required 32 minutes of patient care (this includes precharting, chart review,  review of results, face-to-face care, etc.).

## 2019-06-06 NOTE — Patient Instructions (Addendum)
You were seen today by Lauraine Rinne, NP  for:   1. 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 Centrilobular emphysema (HCC)  Continue performance nebulized medications twice daily  Continue budesonide nebulized medications twice daily  3. Chronic respiratory failure with hypoxia (HCC)  Continue oxygen therapy as prescribed  >>>maintain oxygen saturations greater than 88 percent  >>>if unable to maintain oxygen saturations please contact the office  >>>do not smoke with oxygen  >>>can use nasal saline gel or nasal saline rinses to moisturize nose if oxygen causes dryness  4. OSA (obstructive sleep apnea)  We will have you establish with a sleep MD in our office for management of your CPAP  Continue CPAP  Follow Up:    Return in about 6 months (around 12/06/2019), or if symptoms worsen or fail to improve, for Follow up with Dr. Valeta [name].  Schedule sleep MD consult for CPAP management, former patient of Dr. Lake Bells in 4 to 8 weeks and 30-minute slot        Please do your part to reduce the spread of COVID-19:      Reduce your risk of any infection  and COVID19 by using the similar precautions used for avoiding the common cold or flu:  Marland Kitchen Wash your hands often with soap and warm water for at least 20 seconds.  If soap and water are not readily available, use an alcohol-based hand sanitizer with at least 60% alcohol.  . If coughing or sneezing, cover your mouth and nose by coughing or sneezing into the elbow areas of your shirt or coat, into a tissue or into your sleeve (not your hands). Langley Gauss A MASK when in public  . Avoid shaking hands with others and consider head nods or verbal greetings only. . Avoid touching your eyes, nose, or mouth with unwashed hands.  . Avoid close contact with people who are sick. . Avoid places or events with large numbers of people in one  location, like concerts or sporting events. . If you have some symptoms but not all symptoms, continue to monitor at home and seek medical attention if your symptoms worsen. . If you are having a medical emergency, call 911.   [facility] / e-Visit: [email or web address]         [facility]: [facility]: [phone]                   [facility]: [phone]     It is flu season:   >>> Best ways to protect herself from the flu: Receive the yearly flu vaccine, practice good hand hygiene washing with soap and also using hand sanitizer when available, eat a nutritious meals, get adequate rest, hydrate appropriately   Please contact the office if your symptoms worsen or you have concerns that you are not improving.   Thank you for choosing [facility] for your healthcare, and for allowing [place] to partner with you on your healthcare journey. I am thankful to be able to provide care to you today.   Wyn Quaker FNP-C

## 2019-06-06 NOTE — Assessment & Plan Note (Signed)
Status post SBRT Plans to repeat CT in June/2021  Plan: Keep follow-up with radiation oncology Complete CT as ordered in June/2021

## 2019-06-07 MED ORDER — PERFOROMIST 20 MCG/2ML IN NEBU: 20.0000 ug | INHALATION_SOLUTION | Freq: Two times a day (BID) | RESPIRATORY_TRACT | 0 refills | Status: DC

## 2019-06-07 NOTE — Addendum Note (Signed)
Addended by: Valerie Salts on: 06/07/2019 08:51 AM   Modules accepted: Orders

## 2019-06-17 ENCOUNTER — Telehealth: Payer: Self-pay | Admitting: *Deleted

## 2019-06-17 NOTE — Progress Notes (Signed)
Thanks for seeing him Garner Nash, DO [facility] 06/17/2019 4:57 PM

## 2019-06-17 NOTE — Telephone Encounter (Signed)
Patient phone call/chart check for [phone] in the Clear Research Study done. No new AE's or SAE's to report. Next phone call/chart check will be in approximately 3 months.

## 2019-07-18 ENCOUNTER — Ambulatory Visit (HOSPITAL_COMMUNITY)
Admission: RE | Admit: 2019-07-18 | Discharge: 2019-07-18 | Disposition: A | Discharge status: Home / Self Care | Payer: Medicare Other | Source: Ambulatory Visit | Attending: Radiation Oncology | Admitting: Radiation Oncology

## 2019-07-18 ENCOUNTER — Other Ambulatory Visit: Payer: Self-pay

## 2019-07-18 DIAGNOSIS — C3401 Malignant neoplasm of right main bronchus: Secondary | ICD-10-CM

## 2019-07-22 ENCOUNTER — Encounter: Payer: Self-pay | Admitting: Radiation Oncology

## 2019-07-22 ENCOUNTER — Ambulatory Visit
Admission: RE | Admit: 2019-07-22 | Discharge: 2019-07-22 | Disposition: A | Discharge status: Home / Self Care | Payer: Medicare Other | Source: Ambulatory Visit | Attending: Radiation Oncology | Admitting: Radiation Oncology

## 2019-07-22 ENCOUNTER — Other Ambulatory Visit: Payer: Self-pay

## 2019-07-22 DIAGNOSIS — Z7982 Long term (current) use of aspirin: Secondary | ICD-10-CM | POA: Diagnosis not present

## 2019-07-22 DIAGNOSIS — I700 Atherosclerosis of aorta: Secondary | ICD-10-CM | POA: Diagnosis not present

## 2019-07-22 DIAGNOSIS — R918 Other nonspecific abnormal finding of lung field: Secondary | ICD-10-CM | POA: Diagnosis present

## 2019-07-22 DIAGNOSIS — K760 Fatty (change of) liver, not elsewhere classified: Secondary | ICD-10-CM | POA: Insufficient documentation

## 2019-07-22 DIAGNOSIS — R911 Solitary pulmonary nodule: Secondary | ICD-10-CM | POA: Diagnosis not present

## 2019-07-22 DIAGNOSIS — Z7984 Long term (current) use of oral hypoglycemic drugs: Secondary | ICD-10-CM | POA: Diagnosis not present

## 2019-07-22 DIAGNOSIS — I2510 Atherosclerotic heart disease of native coronary artery without angina pectoris: Secondary | ICD-10-CM | POA: Diagnosis not present

## 2019-07-22 DIAGNOSIS — J439 Emphysema, unspecified: Secondary | ICD-10-CM | POA: Insufficient documentation

## 2019-07-22 DIAGNOSIS — C3411 Malignant neoplasm of upper lobe, right bronchus or lung: Secondary | ICD-10-CM

## 2019-07-22 DIAGNOSIS — Z79899 Other long term (current) drug therapy: Secondary | ICD-10-CM | POA: Insufficient documentation

## 2019-07-22 NOTE — Progress Notes (Signed)
Radiation Oncology         [phone] ________________________________  Name: William Dyer MRN: [id number]  Date: 07/22/2019  DOB: Nov 21, 1945  Follow-Up Visit Note  CC: [name]., MD  Langston Masker Lutricia Horsfall, MD    ICD-10-CM   1. Malignant neoplasm of upper lobe of right lung [facility])  C34.11     Diagnosis:  Presumptive stage I bronchogenic neoplasmpresenting in the medial right upper lobe(1.4 cm)  Interval Since Last Radiation: Four months and five days.  03/12/2019 through 03/19/2019 Site Technique Total Dose (Gy) Dose per Fx (Gy) Completed Fx Beam Energies  Lung, Right: Lung_Rt IMRT 54/54 18 3/3 6XFFF    Narrative:  The patient returns today for routine follow-up. Since his last visit, he was seen by Wyn Quaker, pulmonology NP, on 04/18/2019. He was to continue oxygen therapy as prescribed and was scheduled to have a repeat chest CT in June.  The patient underwent a chest CT scan on 07/18/2019. Results showed a slight decrease in size of the medial right upper lobe pulmonary nodule, which then measured 8 x 5 mm. There were no other new suspicious appearing pulmonary nodules or masses noted. Additionally, there was some diffuse bronchial wall thickening with moderate to severe centrilobular and paraseptal emphysema suggestive of underlying COPD.  On review of systems, he reports using 3L supplemental oxygen via nasal cannula when up and walking. He does not use it when at rest. He denies cough, hemoptysis, and difficult swallowing.  ALLERGIES:  is allergic to rosuvastatin calcium, statins, and sulfonamide derivatives.  Meds: Current Outpatient Medications  Medication Sig Dispense Refill   albuterol (PROAIR HFA) 108 (90 Base) MCG/ACT inhaler Inhale 1-2 puffs into the lungs every 6 (six) hours as needed for wheezing or shortness of breath. 16 g 0   Alirocumab (PRALUENT) 75 MG/ML SOAJ Inject 1 pen into the skin every 14 (fourteen) days. 2 pen 11   aspirin 81 MG tablet Take 81  mg by mouth daily.     Cholecalciferol 50 MCG (2000 UT) TABS Take 2,000 mg by mouth daily.     fluticasone (FLONASE) 50 MCG/ACT nasal spray Place 1 spray into both nostrils daily.     formoterol (PERFOROMIST) 20 MCG/2ML nebulizer solution Take 2 mLs (20 mcg total) by nebulization 2 (two) times daily. Dx:J43.2 120 mL 0   formoterol (PERFOROMIST) 20 MCG/2ML nebulizer solution Take 2 mLs (20 mcg total) by nebulization 2 (two) times daily. 20 mL 0   gabapentin (NEURONTIN) 400 MG capsule Take 400 mg by mouth at bedtime.     ibuprofen (ADVIL) 600 MG tablet Take by mouth.     levothyroxine (SYNTHROID, LEVOTHROID) 137 MCG tablet Take 137 mcg by mouth daily.     lisinopril-hydrochlorothiazide (PRINZIDE,ZESTORETIC) 10-12.5 MG per tablet 1/2 tab by mouth once daily     loratadine (CLARITIN) 10 MG tablet Take 10 mg by mouth daily as needed for allergies or rhinitis.      metFORMIN (GLUCOPHAGE) 1000 MG tablet Take 1,000 mg by mouth 2 (two) times daily with a meal.      nitroGLYCERIN (NITROSTAT) 0.4 MG SL tablet Place 1 tablet (0.4 mg total) under the tongue every 5 (five) minutes as needed for chest pain (3 doses max). 25 tablet 3   tamsulosin (FLOMAX) 0.4 MG CAPS capsule Take 0.4 mg by mouth daily after supper.      Vitamin D, Ergocalciferol, (DRISDOL) 1.25 MG (50000 UT) CAPS capsule Take 50,000 Units by mouth once a week.  guaiFENesin (MUCINEX) 600 MG 12 hr tablet Take 600 mg by mouth 2 (two) times daily as needed for to loosen phlegm. For congestion (Patient not taking: Reported on 07/22/2019)     pregabalin (LYRICA) 150 MG capsule Take 150 mg by mouth 2 (two) times daily. (Patient not taking: Reported on 07/22/2019)     No current facility-administered medications for this encounter.    Physical Findings: The patient is in no acute distress. Patient is alert and oriented.  height is 5\' 4"  (1.626 m) and weight is 212 lb 6 oz (96.3 kg). His temporal temperature is 98.1 F (36.7 C). His  blood pressure is 151/79 (abnormal) and his pulse is 74. His respiration is 22 (abnormal) and oxygen saturation is 94%. .  No significant changes. Lungs are clear to auscultation bilaterally. Heart has regular rate and rhythm. No palpable cervical, supraclavicular, or axillary adenopathy. Abdomen soft, non-tender, normal bowel sounds.  Supplemental oxygen in place at 3 L   Lab Findings: Lab Results  Component Value Date   WBC 16.2 (H) 05/15/2013   HGB 12.1 (L) 05/15/2013   HCT 35.6 (L) 05/15/2013   MCV 92.7 05/15/2013   PLT 223 05/15/2013    Radiographic Findings: CT Chest Wo Contrast  Result Date: 07/19/2019 CLINICAL DATA:  74 year old male with history of non-small cell lung cancer. Status post radiation therapy, now complete. EXAM: CT CHEST WITHOUT CONTRAST TECHNIQUE: Multidetector CT imaging of the chest was performed following the standard protocol without IV contrast. COMPARISON:  PET-CT 02/12/2019. Chest CT 10/30/2018. FINDINGS: Cardiovascular: Heart size is normal. There is no significant pericardial fluid, thickening or pericardial calcification. There is aortic atherosclerosis, as well as atherosclerosis of the great vessels of the mediastinum and the coronary arteries, including calcified atherosclerotic plaque in the left main, left anterior descending, left circumflex and right coronary arteries. Status post median sternotomy for CABG including LIMA to the LAD. Mediastinum/Nodes: No pathologically enlarged mediastinal or hilar lymph nodes. Esophagus is unremarkable in appearance. No axillary lymphadenopathy. Lungs/Pleura: Previously noted right upper lobe pulmonary nodule currently measures 8 x 5 mm (axial image 39 of series 7). No other larger more suspicious appearing pulmonary nodules or masses are noted. Postoperative changes of wedge resection in the right lower lobe. No acute consolidative airspace disease. No pleural effusions. Diffuse bronchial wall thickening with moderate to  severe centrilobular and paraseptal emphysema. Upper Abdomen: Aortic atherosclerosis. Diffuse low attenuation throughout the visualized hepatic parenchyma, indicative of hepatic steatosis. Musculoskeletal: Median sternotomy wires. There are no aggressive appearing lytic or blastic lesions noted in the visualized portions of the skeleton. IMPRESSION: 1. Slight decreased size of medial right upper lobe pulmonary nodule which currently measures 8 x 5 mm. No other new suspicious appearing pulmonary nodules or masses are noted. 2. Diffuse bronchial wall thickening with moderate to severe centrilobular and paraseptal emphysema; imaging findings suggestive of underlying COPD. 3. Aortic atherosclerosis, in addition to left main and 3 vessel coronary artery disease. Status post median sternotomy for CABG including LIMA to the LAD. 4. Hepatic steatosis. Aortic Atherosclerosis (ICD10-I70.0) and Emphysema (ICD10-J43.9). Electronically Signed   By: Vinnie Langton M.D.   On: 07/19/2019 14:07    Impression:  Presumptive stage I bronchogenic neoplasmpresenting in the medial right upper lobe(1.4 cm)  Recent chest CT scan shows favorable response to his stereotactic body radiation therapy  Plan: Follow-up with radiation oncology in 6 months.  Prior to this visit the patient will undergo a CT scan of the chest.  ____________________________________   Jeneen Rinks  Jabier Gauss, PhD, MD  This document serves as a record of services personally performed by [name], MD. It was created on his behalf by Clerance Lav, a trained medical scribe. The creation of this record is based on the scribe's personal observations and the provider's statements to them. This document has been checked and approved by the attending provider.

## 2019-07-22 NOTE — Progress Notes (Signed)
Patient here for a f/u visit and for CT results. Patient on O2 at 3l n/c if up and walking. No oxygen at home if resting. Denies a cough or hemoptysis. Wears a CPAP at night with oxygen. Denies problems swallowing.   BP (!) 151/79 (BP Location: Left Arm, Patient Position: Sitting)   Pulse 74   Temp 98.1 F (36.7 C) (Temporal)   Resp (!) 22   Ht 5\' 4"  (1.626 m)   Wt 212 lb 6 oz (96.3 kg)   SpO2 94%   BMI 36.45 kg/m    Wt Readings from Last 3 Encounters:  07/22/19 212 lb 6 oz (96.3 kg)  06/06/19 217 lb (98.4 kg)  04/18/19 214 lb 4 oz (97.2 kg)

## 2019-08-14 NOTE — Progress Notes (Signed)
.   Date:  08/16/2019   ID:  William Dyer, DOB 07-11-1945, MRN [id number]  Provider Location: Office  PCP:  [name]., MD  Cardiologist:   [name] Electrophysiologist:  None   Evaluation Performed:  Follow-Up Visit  Chief Complaint:  CAD/CABG  History of Present Illness:    74 y.o. history of CABG 2013 SVG to D1, SVG to RCA and LIMA to LAD. Significant COPD followed by Dr Lake Bells Last myovue done 10/30/17 EF 65% diaphragmatic attenuation with no ischemia low risk study  Still smoking On oxygen Diagnosed with RUL adenocarcinoma and has had XRT Rx seeing [name] Activity limited by back pain Has seen neurology and not thought to be a surgical candidate   Has been mowing the yard but not much else   The patient  does not have symptoms concerning for COVID-19 infection (fever, chills, cough, or new shortness of breath).    Past Medical History:  Diagnosis Date  . CAD (coronary artery disease)    Remote PCI; s/p CABG x 3 in Feb 2013  . COPD (chronic obstructive pulmonary disease) ([place])   . Diabetes mellitus   . GERD (gastroesophageal reflux disease)   . Heart murmur   . Hyperlipidemia   . Hypothyroidism   . Lung cancer ([place]) 07/2000   status post right lower lobectomy for adenocarcinoma  . Obesity   . Pneumonia    Past Surgical History:  Procedure Laterality Date  . CORONARY ANGIOPLASTY  1990's  . CORONARY ARTERY BYPASS GRAFT  03/18/2011   Procedure: CORONARY ARTERY BYPASS GRAFTING (CABG);  Surgeon: [name] [name], MD;  Location: [facility];  Service: Open Heart Surgery;  Laterality: N/A;  . INGUINAL HERNIA REPAIR  1960's   "? side"  . LUNG LOBECTOMY     right, lower; "for lung cancer"  . TONSILLECTOMY AND ADENOIDECTOMY  1950's     Current Meds  Medication Sig  . albuterol (PROAIR HFA) 108 (90 Base) MCG/ACT inhaler Inhale 1-2 puffs into the lungs every 6 (six) hours as needed for wheezing or shortness of breath.  . Alirocumab (PRALUENT) 75 MG/ML SOAJ Inject 1 pen  into the skin every 14 (fourteen) days.  Marland Kitchen aspirin 81 MG tablet Take 81 mg by mouth daily.  . Cholecalciferol 50 MCG (2000 UT) TABS Take 2,000 mg by mouth daily.  . fluticasone (FLONASE) 50 MCG/ACT nasal spray Place 1 spray into both nostrils daily.  . formoterol (PERFOROMIST) 20 MCG/2ML nebulizer solution Take 2 mLs (20 mcg total) by nebulization 2 (two) times daily. Dx:J43.2  . gabapentin (NEURONTIN) 400 MG capsule Take 400 mg by mouth at bedtime.  Marland Kitchen guaiFENesin (MUCINEX) 600 MG 12 hr tablet Take 600 mg by mouth 2 (two) times daily as needed for to loosen phlegm. For congestion  . ibuprofen (ADVIL) 600 MG tablet Take by mouth.  . levothyroxine (SYNTHROID, LEVOTHROID) 137 MCG tablet Take 137 mcg by mouth daily.  Marland Kitchen lisinopril-hydrochlorothiazide (PRINZIDE,ZESTORETIC) 10-12.5 MG per tablet 1/2 tab by mouth once daily  . loratadine (CLARITIN) 10 MG tablet Take 10 mg by mouth daily as needed for allergies or rhinitis.   . metFORMIN (GLUCOPHAGE) 1000 MG tablet Take 1,000 mg by mouth 2 (two) times daily with a meal.   . nitroGLYCERIN (NITROSTAT) 0.4 MG SL tablet Place 1 tablet (0.4 mg total) under the tongue every 5 (five) minutes as needed for chest pain (3 doses max).  . tamsulosin (FLOMAX) 0.4 MG CAPS capsule Take 0.4 mg by mouth daily after  supper.      Allergies:   Rosuvastatin calcium, Statins, and Sulfonamide derivatives   Social History   Tobacco Use  . Smoking status: Former Smoker    Packs/day: 1.00    Years: 50.00    Pack years: 50.00    Types: Cigarettes    Quit date: 02/10/2018    Years since quitting: 1.5  . Smokeless tobacco: Never Used  Substance Use Topics  . Alcohol use: No    Alcohol/week: 0.0 standard drinks  . Drug use: No     Family Hx: The patient's family history includes Emphysema in an other family member; Lung cancer in an other family member.  ROS:   Please see the history of present illness.     All other systems reviewed and are negative.   Prior CV  studies:   The following studies were reviewed today:  Myovue 10/30/17  Labs/Other Tests and Data Reviewed:    EKG:  01/05/17 SR RBBB LPFB 01/28/19 ST rate 100 RBBB   Recent Labs: 03/27/2019: ALT 14   Recent Lipid Panel Lab Results  Component Value Date/Time   CHOL 201 (H) 03/27/2019 01:29 PM   TRIG 287 (H) 03/27/2019 01:29 PM   HDL 60 03/27/2019 01:29 PM   CHOLHDL 3.4 03/27/2019 01:29 PM   CHOLHDL 3.4 08/07/2015 08:51 AM   LDLCALC 93 03/27/2019 01:29 PM   LDLDIRECT 175 (H) 12/24/2018 12:11 PM   LDLDIRECT 72.0 03/31/2014 08:50 AM    Wt Readings from Last 3 Encounters:  08/16/19 210 lb (95.3 kg)  07/22/19 212 lb 6 oz (96.3 kg)  06/06/19 217 lb (98.4 kg)     Objective:    Vital Signs:  BP 126/68   Pulse 94   Ht 5\' 4"  (1.626 m)   Wt 210 lb (95.3 kg)   SpO2 97%   BMI 36.05 kg/m    Affect appropriate Healthy:  appears stated age 17: normal Neck supple with no adenopathy JVP normal no bruits no thyromegaly Lungs clear with no wheezing and good diaphragmatic motion Heart:  S1/S2 no murmur, no rub, gallop or click PMI normal post sternotomy  Abdomen: benighn, BS positve, no tenderness, no AAA no bruit.  No HSM or HJR Distal pulses intact with no bruits No edema Neuro non-focal Skin warm and dry No muscular weakness   ASSESSMENT & PLAN:    CAD: CABG 2013 Non ischemic myovue 10/30/17 EF 65% no RWMA;s continue medical Rx   Chol: received financial assistance for Praluent started 12/25/18 marked improvement in LDL from 168 to 93   DM: Discussed low carb diet.  Target hemoglobin A1c is 6.5 or less.  Continue current medications.  Lung Cancer: post RLL resection and XRT for RUL tumor f/u CT 07/18/19 stable f/u with Dr Valeta [name] and [name]   Thyroid   On synthroid replacement labs with primary   RBBB  Likely related to lung disease f/u ECG yearly   COVID-19 Education: The signs and symptoms of COVID-19 were discussed with the patient and how to seek care for  testing (follow up with PCP or arrange E-visit).  The importance of social distancing was discussed today.  Time:   Today, I have spent 30 minutes with the patient      Medication Adjustments/Labs and Tests Ordered: Current medicines are reviewed at length with the patient today.  Concerns regarding medicines are outlined above.   Tests Ordered:  None   Medication Changes:  None   Disposition:  Follow up in 6  months    Signed, Jenkins Rouge, MD  08/16/2019 2:49 PM    [place]

## 2019-08-16 ENCOUNTER — Other Ambulatory Visit: Payer: Self-pay

## 2019-08-16 ENCOUNTER — Ambulatory Visit: Payer: Medicare Other | Admitting: Cardiovascular Disease

## 2019-08-16 ENCOUNTER — Encounter: Payer: Self-pay | Admitting: Cardiovascular Disease

## 2019-08-16 VITALS — BP 126/68 | HR 94 | Ht 64.0 in | Wt 210.0 lb

## 2019-08-16 DIAGNOSIS — Z951 Presence of aortocoronary bypass graft: Secondary | ICD-10-CM

## 2019-08-16 DIAGNOSIS — E785 Hyperlipidemia, unspecified: Secondary | ICD-10-CM

## 2019-08-16 NOTE — Patient Instructions (Addendum)
Medication Instructions:   *If you need a refill on your cardiac medications before your next appointment, please call your pharmacy*   Lab Work: Your physician recommends that you return for lab work in: 2 months - lipid and liver panel.  If you have labs (blood work) drawn today and your tests are completely normal, you will receive your results only by: Marland Kitchen MyChart Message (if you have MyChart) OR . A paper copy in the mail If you have any lab test that is abnormal or we need to change your treatment, we will call you to review the results.   Testing/Procedures: None ordered today.  Follow-Up: At [facility], you and your health needs are our priority.  As part of our continuing mission to provide you with exceptional heart care, we have created designated Provider Care Teams.  These Care Teams include your primary Cardiologist (physician) and Advanced Practice Providers (APPs -  Physician Assistants and Nurse Practitioners) who all work together to provide you with the care you need, when you need it.  We recommend signing up for the patient portal called "MyChart".  Sign up information is provided on this After Visit Summary.  MyChart is used to connect with patients for Virtual Visits (Telemedicine).  Patients are able to view lab/test results, encounter notes, upcoming appointments, etc.  Non-urgent messages can be sent to your provider as well.   To learn more about what you can do with MyChart, go to [email or web address].    Your next appointment:   6 month(s)  The format for your next appointment:   In Person  Provider:   You may see Jenkins Rouge, MD or one of the following Advanced Practice Providers on your designated Care Team:    Truitt Merle, NP  Cecilie Kicks, NP  Kathyrn Drown, NP

## 2019-09-06 ENCOUNTER — Telehealth: Payer: Self-pay | Admitting: *Deleted

## 2019-09-06 NOTE — Telephone Encounter (Signed)
Completed telephone call/medical record check for CLEAR visit T17-M45. Medication changes have been updated. No new AE's/SAE's to report. Next phone call will be in approximately 3 months.

## 2019-10-15 ENCOUNTER — Telehealth: Payer: Self-pay | Admitting: Cardiovascular Disease

## 2019-10-15 NOTE — Telephone Encounter (Signed)
Returned call to pt.  I was able to pull his labs from [facility] from his PCP dated 08/21/19, so therefore, I advised the pt that he didn't need to come to our office for the Lipid Panel.  Will fwd to Pam to make her aware.

## 2019-10-15 NOTE — Telephone Encounter (Signed)
Patient states he had lab work done back in July.  He said his PCP did a full set of lab work on him.  He said it should be on MyChart.  He can't come on Thursday for his lab appt.

## 2019-10-17 ENCOUNTER — Other Ambulatory Visit: Payer: Medicare Other

## 2019-11-21 ENCOUNTER — Other Ambulatory Visit: Payer: Self-pay | Admitting: Cardiovascular Disease

## 2019-11-22 MED ORDER — PRALUENT 75 MG/ML ~~LOC~~ SOAJ: SUBCUTANEOUS | 11 refills | Status: DC

## 2019-11-22 NOTE — Addendum Note (Signed)
Addended by: Darya Bigler E on: 11/22/2019 02:21 PM   Modules accepted: Orders

## 2019-11-22 NOTE — Addendum Note (Signed)
Addended by: Matti Minney E on: 11/22/2019 08:32 AM   Modules accepted: Orders

## 2019-11-22 NOTE — Addendum Note (Signed)
Addended by: Jailin Moomaw E on: 11/22/2019 02:05 PM   Modules accepted: Orders

## 2019-11-22 NOTE — Addendum Note (Signed)
Addended by: Emmerich Cryer E on: 11/22/2019 02:23 PM   Modules accepted: Orders

## 2019-11-25 ENCOUNTER — Other Ambulatory Visit: Payer: Self-pay | Admitting: Cardiovascular Disease

## 2019-11-26 ENCOUNTER — Telehealth: Payer: Self-pay

## 2019-11-26 NOTE — Telephone Encounter (Signed)
Returned call to pt and advised him that [employer] has closed and we do not have other patient assistance options at this time. His Praluent copay is now $58 which is cost prohibitive for him.  Pt is previously intolerant to simvastatin, atorvastatin 10mg , and multiple doses of rosuvastatin (myalgias with all). Discussed trying ezetimibe instead. Pt does not wish to start this today but states he will think about it and call us if he does want to start on therapy.

## 2019-11-26 NOTE — Telephone Encounter (Signed)
Pt calling stating that his pharmacy will not refill his praluent because pt does not have any coupons and pt cannot afford to pay for it. Pt would like a call back concerning this matter. Please address

## 2019-11-27 MED ORDER — EZETIMIBE 10 MG PO TABS: 10.0000 mg | ORAL_TABLET | Freq: Every day | ORAL | 11 refills | Status: DC

## 2019-11-27 NOTE — Addendum Note (Signed)
Addended by: Harleigh Civello E on: 11/27/2019 01:00 PM   Modules accepted: Orders

## 2019-11-27 NOTE — Telephone Encounter (Signed)
Patient states he is returning an additional call from the pharmacy to further discuss medication management.

## 2019-11-27 NOTE — Telephone Encounter (Signed)
Returned call to pt. He states he is interested in trying ezetimibe. Rx sent to pharmacy. Will call pt in 1 month to assess tolerability.

## 2019-12-23 ENCOUNTER — Encounter: Payer: Self-pay | Admitting: *Deleted

## 2019-12-25 ENCOUNTER — Telehealth: Payer: Self-pay | Admitting: Pharmacist

## 2019-12-25 NOTE — Telephone Encounter (Signed)
Called pt to follow up with ezetimibe tolerability. Pt reports tolerating ok over the past month. Does report some back pain that started 4-5 days ago, however states that he does have a bad back anyway. Also reports some gas but states he has had some GI issues for the past few months and is scheduled to have a colonoscopy soon.  Pt wishes to continue on ezetimibe at this time. Encouraged him to take it with food and advised that he can try skipping it for 1 week to see if either back pain or gas resolves but to let us know if he does this. Otherwise, will plan to recheck fasting lipids at next appt with Dr [name] in January.

## 2019-12-26 NOTE — Progress Notes (Signed)
Completed phone call for Visit T18, M48 for CLEAR research study. All concomitant medications have been reviewed and updated if applicable. No AE's or SAE's to report at this time. Next phone call/chart review will be in approximately 3 months.

## 2020-01-15 ENCOUNTER — Telehealth: Payer: Self-pay | Admitting: Radiation Oncology

## 2020-01-15 NOTE — Telephone Encounter (Signed)
Patient wants to see if Dr. [name] will allow his CT scan and f/u with him (to be done next week) pushed out to the week of Jan. 17th. I've sent Dr. [name] an in basket msg to inquire and will let the patient know.

## 2020-01-16 ENCOUNTER — Telehealth: Payer: Self-pay | Admitting: *Deleted

## 2020-01-16 NOTE — Telephone Encounter (Signed)
Called patient to advise that I need Dr. Clabe Seal permission to move this scan and the fu, patient verified understanding this.

## 2020-01-16 NOTE — Telephone Encounter (Signed)
CALLED PATIENT TO INFORM OF CT FOR - 02-28-20 - ARRIVAL TIME- 11:45 AM @ [facility], NO RESTRICTIONS TO TEST, AND PATIENT TO RECEIVE RESULTS FROM DR. [place] ON 03-02-20 @ 11:15 AM, PATIENT AGREED TO NEW DATE AND TIME

## 2020-01-22 ENCOUNTER — Ambulatory Visit (HOSPITAL_COMMUNITY): Payer: Medicare Other

## 2020-01-23 ENCOUNTER — Ambulatory Visit: Payer: Self-pay | Admitting: Radiation Oncology

## 2020-02-10 ENCOUNTER — Telehealth: Payer: Self-pay | Admitting: Pharmacist

## 2020-02-10 MED ORDER — PRALUENT 75 MG/ML ~~LOC~~ SOAJ: 1.0000 "pen " | SUBCUTANEOUS | 3 refills | Status: DC

## 2020-02-10 NOTE — Telephone Encounter (Addendum)
Called pt to resume Praluent injections since [employer] has reopened. He had resumed ezetimibe last fall, LDL checked at PCP on 01/21/20 was still very elevated at 173. Pt aware to continue on ezetimibe and to resume Praluent injections every 2 weeks. Fatima Sanger info has been called into pharmacy. He sees his PCP again in March and states they can recheck labs.

## 2020-02-21 ENCOUNTER — Ambulatory Visit: Payer: Medicare Other | Admitting: Cardiovascular Disease

## 2020-02-26 ENCOUNTER — Telehealth: Payer: Self-pay | Admitting: *Deleted

## 2020-02-26 NOTE — Telephone Encounter (Signed)
CALLED PATIENT TO INFORM THAT I HAVE MOVED HIS FU HAS BEEN MOVED TO 03-16-20 @ 11:45 AM, DUE TO SCAN BEING MOVED TO 03-05-20 ( I OFFERED AN APPT. ON 03-12-20 @ 9:15 AM, BUT THE PATIENT DECLINED), PATIENT VERIFIED UNDERSTANDING THIS APPT.

## 2020-02-28 ENCOUNTER — Ambulatory Visit (HOSPITAL_COMMUNITY): Payer: Medicare Other

## 2020-03-02 ENCOUNTER — Ambulatory Visit: Payer: Self-pay | Admitting: Radiation Oncology

## 2020-03-05 ENCOUNTER — Ambulatory Visit (HOSPITAL_COMMUNITY)
Admission: RE | Admit: 2020-03-05 | Discharge: 2020-03-05 | Disposition: A | Discharge status: Home / Self Care | Payer: Medicare Other | Source: Ambulatory Visit | Attending: Radiation Oncology | Admitting: Radiation Oncology

## 2020-03-05 ENCOUNTER — Other Ambulatory Visit: Payer: Self-pay

## 2020-03-05 DIAGNOSIS — C3411 Malignant neoplasm of upper lobe, right bronchus or lung: Secondary | ICD-10-CM | POA: Diagnosis not present

## 2020-03-06 NOTE — Progress Notes (Signed)
.   Date:  03/13/2020   ID:  William Dyer, DOB 1945/02/12, MRN [id number]  Provider Location: Office  PCP:  [name]., MD  Cardiologist:   [name] Electrophysiologist:  None   Evaluation Performed:  Follow-Up Visit  Chief Complaint:  CAD/CABG  History of Present Illness:    75 y.o. history of CABG 2013 SVG to D1, SVG to RCA and LIMA to LAD. Significant COPD followed by Dr [place] Bells Last myovue done 10/30/17 EF 65% diaphragmatic attenuation with no ischemia low risk study  Still smoking On oxygen Diagnosed with RUL adenocarcinoma and has had XRT Rx seeing [name] Activity limited by back pain Has seen neurology and not thought to be a surgical candidate CT done 03/05/20 showed continued further decrease in size of RUL nodule down to 4 x 4 mm   More sedentary during COVID Has some atypical sharp chest pain when he lays down at night that  Does not sound cardiac   The patient  does not have symptoms concerning for COVID-19 infection (fever, chills, cough, or new shortness of breath).    Past Medical History:  Diagnosis Date  . CAD (coronary artery disease)    Remote PCI; s/p CABG x 3 in Feb 2013  . COPD (chronic obstructive pulmonary disease) ([place])   . Diabetes mellitus   . GERD (gastroesophageal reflux disease)   . Heart murmur   . Hyperlipidemia   . Hypothyroidism   . Lung cancer ([place]) 07/2000   status post right lower lobectomy for adenocarcinoma  . Obesity   . Pneumonia    Past Surgical History:  Procedure Laterality Date  . CORONARY ANGIOPLASTY  1990's  . CORONARY ARTERY BYPASS GRAFT  03/18/2011   Procedure: CORONARY ARTERY BYPASS GRAFTING (CABG);  Surgeon: [name] [name], MD;  Location: [facility];  Service: Open Heart Surgery;  Laterality: N/A;  . INGUINAL HERNIA REPAIR  1960's   "? side"  . LUNG LOBECTOMY     right, lower; "for lung cancer"  . TONSILLECTOMY AND ADENOIDECTOMY  1950's     Current Meds  Medication Sig  . albuterol (PROAIR HFA) 108 (90 Base)  MCG/ACT inhaler Inhale 1-2 puffs into the lungs every 6 (six) hours as needed for wheezing or shortness of breath.  . Alirocumab (PRALUENT) 75 MG/ML SOAJ Inject 1 pen into the skin every 14 (fourteen) days.  Marland Kitchen aspirin 81 MG tablet Take 81 mg by mouth daily.  . Cholecalciferol 50 MCG (2000 UT) TABS Take 2,000 mg by mouth daily.  . fluticasone (FLONASE) 50 MCG/ACT nasal spray Place 1 spray into both nostrils daily.  . formoterol (PERFOROMIST) 20 MCG/2ML nebulizer solution Take 2 mLs (20 mcg total) by nebulization 2 (two) times daily. Dx:J43.2  . gabapentin (NEURONTIN) 400 MG capsule Take 400 mg by mouth at bedtime.  Marland Kitchen guaiFENesin (MUCINEX) 600 MG 12 hr tablet Take 600 mg by mouth 2 (two) times daily as needed for to loosen phlegm. For congestion  . hydrochlorothiazide (MICROZIDE) 12.5 MG capsule Take 12.5 mg by mouth daily.  Marland Kitchen ibuprofen (ADVIL) 600 MG tablet Take by mouth.  . levothyroxine (SYNTHROID, LEVOTHROID) 137 MCG tablet Take 137 mcg by mouth daily.  Marland Kitchen lisinopril (ZESTRIL) 5 MG tablet Take 5 mg by mouth daily.  Marland Kitchen lisinopril-hydrochlorothiazide (PRINZIDE,ZESTORETIC) 10-12.5 MG per tablet 1/2 tab by mouth once daily  . loratadine (CLARITIN) 10 MG tablet Take 10 mg by mouth daily as needed for allergies or rhinitis.   . metFORMIN (GLUCOPHAGE) 1000 MG tablet  Take 1,000 mg by mouth 2 (two) times daily with a meal.   . nitroGLYCERIN (NITROSTAT) 0.4 MG SL tablet Place 1 tablet (0.4 mg total) under the tongue every 5 (five) minutes as needed for chest pain (3 doses max).  . tamsulosin (FLOMAX) 0.4 MG CAPS capsule Take 0.4 mg by mouth daily after supper.   . [DISCONTINUED] ezetimibe (ZETIA) 10 MG tablet Take 1 tablet (10 mg total) by mouth daily.     Allergies:   Rosuvastatin calcium, Statins, and Sulfonamide derivatives   Social History   Tobacco Use  . Smoking status: Former Smoker    Packs/day: 1.00    Years: 50.00    Pack years: 50.00    Types: Cigarettes    Quit date: 02/10/2018     Years since quitting: 2.0  . Smokeless tobacco: Never Used  Substance Use Topics  . Alcohol use: No    Alcohol/week: 0.0 standard drinks  . Drug use: No     Family Hx: The patient's family history includes Emphysema in an other family member; Lung cancer in an other family member.  ROS:   Please see the history of present illness.     All other systems reviewed and are negative.   Prior CV studies:   The following studies were reviewed today:  Myovue 10/30/17  Labs/Other Tests and Data Reviewed:    EKG:  01/05/17 SR RBBB LPFB 01/28/19 ST rate 100 RBBB  03/13/2020 SR rBBB no acute changes   Recent Labs: 03/27/2019: ALT 14   Recent Lipid Panel Lab Results  Component Value Date/Time   CHOL 201 (H) 03/27/2019 01:29 PM   TRIG 287 (H) 03/27/2019 01:29 PM   HDL 60 03/27/2019 01:29 PM   CHOLHDL 3.4 03/27/2019 01:29 PM   CHOLHDL 3.4 08/07/2015 08:51 AM   LDLCALC 93 03/27/2019 01:29 PM   LDLDIRECT 175 (H) 12/24/2018 12:11 PM   LDLDIRECT 72.0 03/31/2014 08:50 AM    Wt Readings from Last 3 Encounters:  03/13/20 92.5 kg  08/16/19 95.3 kg  07/22/19 96.3 kg     Objective:    Vital Signs:  BP (!) 120/58   Pulse 74   Ht 5\' 4"  (1.626 m)   Wt 92.5 kg   SpO2 94%   BMI 35.02 kg/m    Affect appropriate Healthy:  appears stated age 71: normal Neck supple with no adenopathy JVP normal no bruits no thyromegaly Lungs post right LLL resection  Heart:  S1/S2 no murmur, no rub, gallop or click PMI normal post sternotomy  Abdomen: benighn, BS positve, no tenderness, no AAA no bruit.  No HSM or HJR Distal pulses intact with no bruits No edema Neuro non-focal Skin warm and dry No muscular weakness   ASSESSMENT & PLAN:    CAD: CABG 2013 Non ischemic myovue 10/30/17 EF 65% no RWMA;s continue medical Rx   Chol: back on Praluent with grant assistance continue Zetia improved   DM: Discussed low carb diet.  Target hemoglobin A1c is 6.5 or less.  Continue current  medications.  Lung Cancer: post RLL resection and XRT for RUL tumor f/u CT 03/05/20  stable f/u with Dr Valeta [name] and [name]   Thyroid   On synthroid replacement labs with primary   RBBB  Likely related to lung disease f/u ECG yearly   COVID-19 Education: The signs and symptoms of COVID-19 were discussed with the patient and how to seek care for testing (follow up with PCP or arrange E-visit).  The importance of social  distancing was discussed today.  Time:   Today, I have spent 30 minutes with the patient      Medication Adjustments/Labs and Tests Ordered: Current medicines are reviewed at length with the patient today.  Concerns regarding medicines are outlined above.   Tests Ordered:  None   Medication Changes:  None   Disposition:  Follow up in 6 months    Signed, Jenkins Rouge, MD  03/13/2020 3:51 PM    [facility]

## 2020-03-13 ENCOUNTER — Encounter: Payer: Self-pay | Admitting: Cardiovascular Disease

## 2020-03-13 ENCOUNTER — Ambulatory Visit: Payer: Medicare Other | Admitting: Cardiovascular Disease

## 2020-03-13 ENCOUNTER — Other Ambulatory Visit: Payer: Self-pay

## 2020-03-13 VITALS — BP 120/58 | HR 74 | Ht 64.0 in | Wt 204.0 lb

## 2020-03-13 DIAGNOSIS — I25810 Atherosclerosis of coronary artery bypass graft(s) without angina pectoris: Secondary | ICD-10-CM

## 2020-03-13 MED ORDER — NITROGLYCERIN 0.4 MG SL SUBL: 0.4000 mg | SUBLINGUAL_TABLET | SUBLINGUAL | 3 refills | Status: DC | PRN

## 2020-03-13 NOTE — Patient Instructions (Addendum)
Medication Instructions:  Your physician has recommended you make the following change in your medication:   1-Take 1 Nitroglycerin (NTG), under your tongue, while sitting. If no relief of pain may repeat NTG, one tab every 5 minutes up to 3 tablets total over 15 minutes. If no relief CALL 911. If you have dizziness/lightheadness while taking NTG, stop taking and call 911.  *If you need a refill on your cardiac medications before your next appointment, please call your pharmacy*  Lab Work: If you have labs (blood work) drawn today and your tests are completely normal, you will receive your results only by: Marland Kitchen MyChart Message (if you have MyChart) OR . A paper copy in the mail If you have any lab test that is abnormal or we need to change your treatment, we will call you to review the results.  Testing/Procedures: None ordered today.  Follow-Up: At [facility], you and your health needs are our priority.  As part of our continuing mission to provide you with exceptional heart care, we have created designated Provider Care Teams.  These Care Teams include your primary Cardiologist (physician) and Advanced Practice Providers (APPs -  Physician Assistants and Nurse Practitioners) who all work together to provide you with the care you need, when you need it.  We recommend signing up for the patient portal called "MyChart".  Sign up information is provided on this After Visit Summary.  MyChart is used to connect with patients for Virtual Visits (Telemedicine).  Patients are able to view lab/test results, encounter notes, upcoming appointments, etc.  Non-urgent messages can be sent to your provider as well.   To learn more about what you can do with MyChart, go to [email or web address].    Your next appointment:   6 month(s)  The format for your next appointment:   In Person  Provider:   You may see Jenkins Rouge, MD or one of the following Advanced Practice Providers on your  designated Care Team:    Kathyrn Drown, NP

## 2020-03-16 ENCOUNTER — Ambulatory Visit
Admission: RE | Admit: 2020-03-16 | Discharge: 2020-03-16 | Disposition: A | Discharge status: Home / Self Care | Payer: Medicare Other | Source: Ambulatory Visit | Attending: Radiation Oncology | Admitting: Radiation Oncology

## 2020-03-16 NOTE — Progress Notes (Incomplete)
Radiation Oncology         [phone] ________________________________  Name: William Dyer MRN: [id number]  Date: 03/16/2020  DOB: [birth year]  Follow-Up Visit Note  CC: [name]., MD  Langston Masker Lutricia Horsfall, MD  No diagnosis found.  Diagnosis:  Presumptive stage I bronchogenic neoplasmpresenting in the medial right upper lobe(1.4 cm)  Interval Since Last Radiation: One year  03/12/2019 through 03/19/2019 Site Technique Total Dose (Gy) Dose per Fx (Gy) Completed Fx Beam Energies  Lung, Right: Lung_Rt IMRT 54/54 18 3/3 6XFFF    Narrative:  The patient returns today for routine follow-up. Since his last visit, he underwent a chest CT scan on 03/05/2020 that showed continued further decrease in size of the medial right upper lobe pulmonary nodule, now measuring 4 x 4 mm. There were no new or progressive findings.  On review of systems, he reports ***. He denies ***.  ALLERGIES:  is allergic to rosuvastatin calcium, statins, and sulfonamide derivatives.  Meds: Current Outpatient Medications  Medication Sig Dispense Refill  . albuterol (PROAIR HFA) 108 (90 Base) MCG/ACT inhaler Inhale 1-2 puffs into the lungs every 6 (six) hours as needed for wheezing or shortness of breath. 16 g 0  . Alirocumab (PRALUENT) 75 MG/ML SOAJ Inject 1 pen into the skin every 14 (fourteen) days. 6 mL 3  . aspirin 81 MG tablet Take 81 mg by mouth daily.    . Cholecalciferol 50 MCG (2000 UT) TABS Take 2,000 mg by mouth daily.    . fluticasone (FLONASE) 50 MCG/ACT nasal spray Place 1 spray into both nostrils daily.    . formoterol (PERFOROMIST) 20 MCG/2ML nebulizer solution Take 2 mLs (20 mcg total) by nebulization 2 (two) times daily. Dx:J43.2 120 mL 0  . gabapentin (NEURONTIN) 400 MG capsule Take 400 mg by mouth at bedtime.    Marland Kitchen guaiFENesin (MUCINEX) 600 MG 12 hr tablet Take 600 mg by mouth 2 (two) times daily as needed for to loosen phlegm. For congestion    . hydrochlorothiazide (MICROZIDE) 12.5 MG  capsule Take 12.5 mg by mouth daily.    Marland Kitchen ibuprofen (ADVIL) 600 MG tablet Take by mouth.    . levothyroxine (SYNTHROID, LEVOTHROID) 137 MCG tablet Take 137 mcg by mouth daily.    Marland Kitchen lisinopril (ZESTRIL) 5 MG tablet Take 5 mg by mouth daily.    Marland Kitchen lisinopril-hydrochlorothiazide (PRINZIDE,ZESTORETIC) 10-12.5 MG per tablet 1/2 tab by mouth once daily    . loratadine (CLARITIN) 10 MG tablet Take 10 mg by mouth daily as needed for allergies or rhinitis.     . metFORMIN (GLUCOPHAGE) 1000 MG tablet Take 1,000 mg by mouth 2 (two) times daily with a meal.     . nitroGLYCERIN (NITROSTAT) 0.4 MG SL tablet Place 1 tablet (0.4 mg total) under the tongue every 5 (five) minutes as needed for chest pain (3 doses max). 25 tablet 3  . tamsulosin (FLOMAX) 0.4 MG CAPS capsule Take 0.4 mg by mouth daily after supper.      No current facility-administered medications for this encounter.    Physical Findings: The patient is in no acute distress. Patient is alert and oriented.  vitals were not taken for this visit. .  No significant changes. Lungs are clear to auscultation bilaterally. Heart has regular rate and rhythm. No palpable cervical, supraclavicular, or axillary adenopathy. Abdomen soft, non-tender, normal bowel sounds. Supplemental oxygen in place at 3 L. ***   Lab Findings: Lab Results  Component Value Date   WBC 16.2 (  H) 05/15/2013   HGB 12.1 (L) 05/15/2013   HCT 35.6 (L) 05/15/2013   MCV 92.7 05/15/2013   PLT 223 05/15/2013    Radiographic Findings: CT Chest Wo Contrast  Result Date: 03/05/2020 CLINICAL DATA:  Non-small-cell lung cancer.  Restaging. EXAM: CT CHEST WITHOUT CONTRAST TECHNIQUE: Multidetector CT imaging of the chest was performed following the standard protocol without IV contrast. COMPARISON:  07/18/2019 FINDINGS: Cardiovascular: The heart size is normal. No substantial pericardial effusion. Coronary artery calcification is evident. Status post CABG. Atherosclerotic calcification is  noted in the wall of the thoracic aorta. Mediastinum/Nodes: No mediastinal lymphadenopathy. No evidence for gross hilar lymphadenopathy although assessment is limited by the lack of intravenous contrast on today's study. The esophagus has normal imaging features. There is no axillary lymphadenopathy. Lungs/Pleura: Centrilobular and paraseptal emphysema evident. The index nodule in the medial right upper lobe continues to decrease in size now measuring about 4 x 4 mm on image 40/5 compared to 8 x 5 mm previously. Stable postsurgical scarring right lower lobe. Peripheral scarring noted right middle lobe and left base. No new suspicious nodule or mass. No focal airspace consolidation. No pleural effusion. Upper Abdomen: Gallstones again noted. Musculoskeletal: No worrisome lytic or sclerotic osseous abnormality. IMPRESSION: 1. Continued further decrease in size of the medial right upper lobe pulmonary nodule, now measuring 4 x 4 mm. 2. No new or progressive findings on today's study. 3. Cholelithiasis. 4. Aortic Atherosclerosis (ICD10-I70.0) and Emphysema (ICD10-J43.9). Electronically Signed   By: Misty Stanley M.D.   On: 03/05/2020 15:35    Impression:  Presumptive stage I bronchogenic neoplasmpresenting in the medial right upper lobe(1.4 cm)  Recent chest CT scan shows continued further decrease in size of the medial right upper lobe pulmonary nodule without new or progressive findings. ***  Plan: The patient will follow up with radiation oncology in six months. A repeat chest CT scan will be performed prior to that visit. ***  Total time spent in this encounter was *** minutes which included reviewing the patient's most recent chest CT scan, physical examination, documentation, and ordering of future chest CT scan.  ____________________________________   Blair Promise, PhD, MD  This document serves as a record of services personally performed by [name], MD. It was created on his behalf by  Clerance Lav, a trained medical scribe. The creation of this record is based on the scribe's personal observations and the provider's statements to them. This document has been checked and approved by the attending provider.

## 2020-03-26 ENCOUNTER — Encounter: Payer: Self-pay | Admitting: Radiation Oncology

## 2020-03-26 ENCOUNTER — Ambulatory Visit
Admission: RE | Admit: 2020-03-26 | Discharge: 2020-03-26 | Disposition: A | Discharge status: Home / Self Care | Payer: Medicare Other | Source: Ambulatory Visit | Attending: Radiation Oncology | Admitting: Radiation Oncology

## 2020-03-26 VITALS — BP 130/63 | HR 84 | Temp 97.8°F | Resp 20 | Ht 64.0 in | Wt 203.4 lb

## 2020-03-26 DIAGNOSIS — M545 Low back pain, unspecified: Secondary | ICD-10-CM | POA: Diagnosis not present

## 2020-03-26 DIAGNOSIS — J984 Other disorders of lung: Secondary | ICD-10-CM | POA: Insufficient documentation

## 2020-03-26 DIAGNOSIS — J439 Emphysema, unspecified: Secondary | ICD-10-CM | POA: Insufficient documentation

## 2020-03-26 DIAGNOSIS — R918 Other nonspecific abnormal finding of lung field: Secondary | ICD-10-CM | POA: Insufficient documentation

## 2020-03-26 DIAGNOSIS — Z79899 Other long term (current) drug therapy: Secondary | ICD-10-CM | POA: Diagnosis not present

## 2020-03-26 DIAGNOSIS — C3411 Malignant neoplasm of upper lobe, right bronchus or lung: Secondary | ICD-10-CM

## 2020-03-26 DIAGNOSIS — Z7982 Long term (current) use of aspirin: Secondary | ICD-10-CM | POA: Diagnosis not present

## 2020-03-26 DIAGNOSIS — Z7984 Long term (current) use of oral hypoglycemic drugs: Secondary | ICD-10-CM | POA: Insufficient documentation

## 2020-03-26 DIAGNOSIS — G8929 Other chronic pain: Secondary | ICD-10-CM | POA: Diagnosis not present

## 2020-03-26 DIAGNOSIS — R911 Solitary pulmonary nodule: Secondary | ICD-10-CM

## 2020-03-26 NOTE — Progress Notes (Signed)
Radiation Oncology         [phone] ________________________________  Name: William Dyer MRN: [id number]  Date: 03/26/2020  DOB: Apr 14, 1945  Follow-Up Visit Note  CC: [name]., MD  Langston Masker Lutricia Horsfall, MD    ICD-10-CM   1. Solitary pulmonary nodule  R91.1   2. Malignant neoplasm of upper lobe of right lung (HCC)  C34.11 CT CHEST WO CONTRAST    Diagnosis:  Presumptive stage I bronchogenic neoplasmpresenting in the medial right upper lobe(1.4 cm)  Interval Since Last Radiation: One year, one week, and one day  03/12/2019 through 03/19/2019 Site Technique Total Dose (Gy) Dose per Fx (Gy) Completed Fx Beam Energies  Lung, Right: Lung_Rt IMRT 54/54 18 3/3 6XFFF    Narrative:  The patient returns today for routine follow-up. Since his last visit, he underwent a chest CT scan on 03/05/2020 that showed continued further decrease in size of the medial right upper lobe pulmonary nodule, now measuring 4 x 4 mm. There were no new or progressive findings.  On review of systems, he reports chronic pain in his lower back as well as his right lower rib cage from his prior surgery in this area. He denies changes in his breathing.  He denies any significant coughing or hemoptysis.  He reports he will be undergoing a colonoscopy in the next several weeks for routine follow-up.  ALLERGIES:  is allergic to rosuvastatin calcium, statins, and sulfonamide derivatives.  Meds: Current Outpatient Medications  Medication Sig Dispense Refill  . albuterol (PROAIR HFA) 108 (90 Base) MCG/ACT inhaler Inhale 1-2 puffs into the lungs every 6 (six) hours as needed for wheezing or shortness of breath. 16 g 0  . Alirocumab (PRALUENT) 75 MG/ML SOAJ Inject 1 pen into the skin every 14 (fourteen) days. 6 mL 3  . aspirin 81 MG tablet Take 81 mg by mouth daily.    . Cholecalciferol 50 MCG (2000 UT) TABS Take 2,000 mg by mouth daily.    . fluticasone (FLONASE) 50 MCG/ACT nasal spray Place 1 spray into both  nostrils daily.    . formoterol (PERFOROMIST) 20 MCG/2ML nebulizer solution Take 2 mLs (20 mcg total) by nebulization 2 (two) times daily. Dx:J43.2 120 mL 0  . gabapentin (NEURONTIN) 400 MG capsule Take 400 mg by mouth at bedtime.    Marland Kitchen guaiFENesin (MUCINEX) 600 MG 12 hr tablet Take 600 mg by mouth 2 (two) times daily as needed for to loosen phlegm. For congestion    . hydrochlorothiazide (MICROZIDE) 12.5 MG capsule Take 12.5 mg by mouth daily.    Marland Kitchen ibuprofen (ADVIL) 600 MG tablet Take by mouth.    . levothyroxine (SYNTHROID, LEVOTHROID) 137 MCG tablet Take 137 mcg by mouth daily.    Marland Kitchen lisinopril (ZESTRIL) 5 MG tablet Take 5 mg by mouth daily.    Marland Kitchen lisinopril-hydrochlorothiazide (PRINZIDE,ZESTORETIC) 10-12.5 MG per tablet 1/2 tab by mouth once daily    . loratadine (CLARITIN) 10 MG tablet Take 10 mg by mouth daily as needed for allergies or rhinitis.     . metFORMIN (GLUCOPHAGE) 1000 MG tablet Take 1,000 mg by mouth 2 (two) times daily with a meal.     . nitroGLYCERIN (NITROSTAT) 0.4 MG SL tablet Place 1 tablet (0.4 mg total) under the tongue every 5 (five) minutes as needed for chest pain (3 doses max). 25 tablet 3  . tamsulosin (FLOMAX) 0.4 MG CAPS capsule Take 0.4 mg by mouth daily after supper.      No current facility-administered  medications for this encounter.    Physical Findings: The patient is in no acute distress. Patient is alert and oriented.  height is 5\' 4"  (1.626 m) and weight is 203 lb 6.4 oz (92.3 kg). His temperature is 97.8 F (36.6 C). His blood pressure is 130/63 and his pulse is 84. His respiration is 20 and oxygen saturation is 95%. .  No significant changes. Lungs are clear to auscultation bilaterally. Heart has regular rate and rhythm. No palpable cervical, supraclavicular, or axillary adenopathy. Abdomen soft, non-tender, normal bowel sounds.  No supplemental oxygen in place today.   Lab Findings: Lab Results  Component Value Date   WBC 16.2 (H) 05/15/2013   HGB  12.1 (L) 05/15/2013   HCT 35.6 (L) 05/15/2013   MCV 92.7 05/15/2013   PLT 223 05/15/2013    Radiographic Findings: CT Chest Wo Contrast  Result Date: 03/05/2020 CLINICAL DATA:  Non-small-cell lung cancer.  Restaging. EXAM: CT CHEST WITHOUT CONTRAST TECHNIQUE: Multidetector CT imaging of the chest was performed following the standard protocol without IV contrast. COMPARISON:  07/18/2019 FINDINGS: Cardiovascular: The heart size is normal. No substantial pericardial effusion. Coronary artery calcification is evident. Status post CABG. Atherosclerotic calcification is noted in the wall of the thoracic aorta. Mediastinum/Nodes: No mediastinal lymphadenopathy. No evidence for gross hilar lymphadenopathy although assessment is limited by the lack of intravenous contrast on today's study. The esophagus has normal imaging features. There is no axillary lymphadenopathy. Lungs/Pleura: Centrilobular and paraseptal emphysema evident. The index nodule in the medial right upper lobe continues to decrease in size now measuring about 4 x 4 mm on image 40/5 compared to 8 x 5 mm previously. Stable postsurgical scarring right lower lobe. Peripheral scarring noted right middle lobe and left base. No new suspicious nodule or mass. No focal airspace consolidation. No pleural effusion. Upper Abdomen: Gallstones again noted. Musculoskeletal: No worrisome lytic or sclerotic osseous abnormality. IMPRESSION: 1. Continued further decrease in size of the medial right upper lobe pulmonary nodule, now measuring 4 x 4 mm. 2. No new or progressive findings on today's study. 3. Cholelithiasis. 4. Aortic Atherosclerosis (ICD10-I70.0) and Emphysema (ICD10-J43.9). Electronically Signed   By: Misty Stanley M.D.   On: 03/05/2020 15:35    Impression:  Presumptive stage I bronchogenic neoplasmpresenting in the medial right upper lobe(1.4 cm)  Recent chest CT scan shows continued further decrease in size of the medial right upper lobe  pulmonary nodule without new or progressive findings.   Plan: The patient will follow up with radiation oncology in six months. A repeat chest CT scan will be performed prior to that visit.   Total time spent in this encounter was 20 minutes which included reviewing the patient's most recent chest CT scan, physical examination, documentation, and ordering of future chest CT scan.  ____________________________________   Blair Promise, PhD, MD  This document serves as a record of services personally performed by [name], MD. It was created on his behalf by Clerance Lav, a trained medical scribe. The creation of this record is based on the scribe's personal observations and the provider's statements to them. This document has been checked and approved by the attending provider.

## 2020-03-26 NOTE — Progress Notes (Signed)
Patient is here today for his follow up to radiation to the right lung.  Patient reports having baseline pain from surgery from removing his right lower lobe.  Patient experiences shortness of breath with exertion.  Denies having any cough.  Denies any skin concerns.  Reports some times he gets strangled rather easily with both liquids and solids.  Appetite is too good.  Reports moderate energy level.    Vitals:   03/26/20 1501  BP: 130/63  Pulse: 84  Resp: 20  Temp: 97.8 F (36.6 C)  SpO2: 95%  Weight: 203 lb 6.4 oz (92.3 kg)  Height: 5\' 4"  (1.626 m)

## 2020-04-01 ENCOUNTER — Telehealth: Payer: Self-pay | Admitting: *Deleted

## 2020-04-01 NOTE — Telephone Encounter (Signed)
Completed phone call/chart review for CLEAR research study visit T19, M51. There are no new AE's or SAE's to report to sponsor. All concomitant medications have been reviewed and updated if applicable. Next phone call/chart review will be in approximately 3 months.

## 2020-05-27 ENCOUNTER — Telehealth: Payer: Self-pay | Admitting: *Deleted

## 2020-05-27 NOTE — Telephone Encounter (Signed)
Completed telephone call/medical chart review for T20, M54 for CLEAR research study. All concomitant medications have been reviewed and updated if applicable. There are no new AE's or SAE's to report to sponsor at this time. Next phone call/chart review will be the End of Study phone call in June 2022.

## 2020-07-30 ENCOUNTER — Telehealth: Payer: Self-pay | Admitting: *Deleted

## 2020-07-30 DIAGNOSIS — Z006 Encounter for examination for normal comparison and control in clinical research program: Secondary | ICD-10-CM

## 2020-07-30 NOTE — Telephone Encounter (Signed)
I called patient for end of study Clear visit. Patient is doing well. I assessed for adverse event and concomitant medications. I reminded patient to eat heart healthy diet and regular exercise program.. I reminded patient I would call him in 30 days for Clear post -study visit.

## 2020-08-20 ENCOUNTER — Telehealth: Payer: Self-pay | Admitting: *Deleted

## 2020-08-20 DIAGNOSIS — Z006 Encounter for examination for normal comparison and control in clinical research program: Secondary | ICD-10-CM

## 2020-08-20 NOTE — Telephone Encounter (Signed)
I called patient for 30-day post study visit for Clear Study.. Patient is doing well with no changes in medications or adverse events. Patient reminded to do heart healthy diet and regular exercise program. The patient is following up with Dr. [name] for cholesterol management in August.

## 2020-09-04 NOTE — Progress Notes (Signed)
.  Virtual Visit via Video Note   This visit type was conducted due to national recommendations for restrictions regarding the COVID-19 Pandemic (e.g. social distancing) in an effort to limit this patient's exposure and mitigate transmission in our community.  Due to her co-morbid illnesses, this patient is at least at moderate risk for complications without adequate follow up.  This format is felt to be most appropriate for this patient at this time.  All issues noted in this document were discussed and addressed.  A limited physical exam was performed with this format.  Please refer to the patient's chart for her consent to telehealth for [facility].    Date:  09/10/2020   ID:  Lawana Chambers, DOB 07/19/1945, MRN [id number]  Provider Location: Office Patient Location : Home   PCP:  [name]., MD  Cardiologist:   [name] Electrophysiologist:  None   Evaluation Performed:  Follow-Up Visit  Chief Complaint:  CAD/CABG  History of Present Illness:    75 y.o. history of CABG 2013 SVG to D1, SVG to RCA and LIMA to LAD. Significant COPD followed by Dr Lake Bells Last myovue done 10/30/17 EF 65% diaphragmatic attenuation with no ischemia low risk study  Not smoking On oxygen Diagnosed with RUL adenocarcinoma and has had XRT Rx seeing [name] Activity limited by back pain Has seen neurology and not thought to be a surgical candidate CT done 03/05/20 showed continued further decrease in size of RUL nodule down to 4 x 4 mm   More sedentary during COVID Has some atypical sharp chest pain when he lays down at night that  Does not sound cardiac   Needs some ENT surgery for blocked tear duct told him that was fine and hold ASA for 7 days    Past Medical History:  Diagnosis Date   CAD (coronary artery disease)    Remote PCI; s/p CABG x 3 in Feb 2013   COPD (chronic obstructive pulmonary disease) ([place])    Diabetes mellitus    GERD (gastroesophageal reflux disease)    Heart murmur     Hyperlipidemia    Hypothyroidism    Lung cancer ([place]) 07/2000   status post right lower lobectomy for adenocarcinoma   Obesity    Pneumonia    Past Surgical History:  Procedure Laterality Date   CORONARY ANGIOPLASTY  1990's   CORONARY ARTERY BYPASS GRAFT  03/18/2011   Procedure: CORONARY ARTERY BYPASS GRAFTING (CABG);  Surgeon: [name] [name], MD;  Location: [place];  Service: Open Heart Surgery;  Laterality: N/A;   INGUINAL HERNIA REPAIR  1960's   "? side"   LUNG LOBECTOMY     right, lower; "for lung cancer"   TONSILLECTOMY AND ADENOIDECTOMY  1950's     Current Meds  Medication Sig   albuterol (PROAIR HFA) 108 (90 Base) MCG/ACT inhaler Inhale 1-2 puffs into the lungs every 6 (six) hours as needed for wheezing or shortness of breath.   Alirocumab (PRALUENT) 75 MG/ML SOAJ Inject 1 pen into the skin every 14 (fourteen) days.   aspirin 81 MG tablet Take 81 mg by mouth daily.   Cholecalciferol 50 MCG (2000 UT) TABS Take 2,000 mg by mouth daily.   fluticasone (FLONASE) 50 MCG/ACT nasal spray Place 1 spray into both nostrils daily.   formoterol (PERFOROMIST) 20 MCG/2ML nebulizer solution Take 2 mLs (20 mcg total) by nebulization 2 (two) times daily. Dx:J43.2   gabapentin (NEURONTIN) 400 MG capsule Take 400 mg by mouth at bedtime.  guaiFENesin (MUCINEX) 600 MG 12 hr tablet Take 600 mg by mouth 2 (two) times daily as needed for to loosen phlegm. For congestion   hydrochlorothiazide (MICROZIDE) 12.5 MG capsule Take 12.5 mg by mouth daily.   ibuprofen (ADVIL) 600 MG tablet Take by mouth.   levothyroxine (SYNTHROID, LEVOTHROID) 137 MCG tablet Take 137 mcg by mouth daily.   lisinopril (ZESTRIL) 5 MG tablet Take 5 mg by mouth daily.   loratadine (CLARITIN) 10 MG tablet Take 10 mg by mouth daily as needed for allergies or rhinitis.    nitroGLYCERIN (NITROSTAT) 0.4 MG SL tablet Place 1 tablet (0.4 mg total) under the tongue every 5 (five) minutes as needed for chest pain (3 doses max).    tamsulosin (FLOMAX) 0.4 MG CAPS capsule Take 0.4 mg by mouth daily after supper.      Allergies:   Rosuvastatin calcium, Statins, Sulfasalazine, and Sulfonamide derivatives   Social History   Tobacco Use   Smoking status: Former    Packs/day: 1.00    Years: 50.00    Pack years: 50.00    Types: Cigarettes    Quit date: 02/10/2018    Years since quitting: 2.5   Smokeless tobacco: Never  Substance Use Topics   Alcohol use: No    Alcohol/week: 0.0 standard drinks   Drug use: No     Family Hx: The patient's family history includes Emphysema in an other family member; Lung cancer in an other family member.  ROS:   Please see the history of present illness.     All other systems reviewed and are negative.   Prior CV studies:   The following studies were reviewed today:  Myovue 10/30/17  Labs/Other Tests and Data Reviewed:    EKG:  01/05/17 SR RBBB LPFB 01/28/19 ST rate 100 RBBB  09/10/2020 SR rBBB no acute changes   Recent Labs: No results found for requested labs within last 8760 hours.   Recent Lipid Panel Lab Results  Component Value Date/Time   CHOL 201 (H) 03/27/2019 01:29 PM   TRIG 287 (H) 03/27/2019 01:29 PM   HDL 60 03/27/2019 01:29 PM   CHOLHDL 3.4 03/27/2019 01:29 PM   CHOLHDL 3.4 08/07/2015 08:51 AM   LDLCALC 93 03/27/2019 01:29 PM   LDLDIRECT 175 (H) 12/24/2018 12:11 PM   LDLDIRECT 72.0 03/31/2014 08:50 AM    Wt Readings from Last 3 Encounters:  09/10/20 94.8 kg  03/26/20 92.3 kg  03/13/20 92.5 kg     Objective:    Vital Signs:  BP 124/70   Pulse 84   Ht 5\' 4"  (1.626 m)   Wt 94.8 kg   BMI 35.87 kg/m    Telephone no exam    ASSESSMENT & PLAN:    CAD: CABG 2013 Non ischemic myovue 10/30/17 EF 65% no RWMA;s continue medical Rx    Chol: back on Praluent with grant assistance continue Zetia improved    DM: Discussed low carb diet.  Target hemoglobin A1c is 6.5 or less.  Continue current medications.   Lung Cancer: post RUL resection and XRT  for RUL tumor f/u CT 03/05/20  stable f/u with Dr Valeta [name] and [name] Presumptive stage 1 bronchogenic neoplasm    Thyroid   On synthroid replacement labs with primary    RBBB  Likely related to lung disease f/u ECG yearly   COVID-19 Education: The signs and symptoms of COVID-19 were discussed with the patient and how to seek care for testing (follow up with PCP or arrange  E-visit).  The importance of social distancing was discussed today.  Time:   Today, I have spent 20 minutes review of chart including oncology notes myovue CT scans direct patient phone interview and composing note   Medication Adjustments/Labs and Tests Ordered: Current medicines are reviewed at length with the patient today.  Concerns regarding medicines are outlined above.   Tests Ordered:  None   Medication Changes:  None   Disposition:  Follow up  in 6 months     Signed, Jenkins Rouge, MD  09/10/2020 8:56 AM    [place]

## 2020-09-10 ENCOUNTER — Other Ambulatory Visit: Payer: Self-pay

## 2020-09-10 ENCOUNTER — Telehealth (INDEPENDENT_AMBULATORY_CARE_PROVIDER_SITE_OTHER): Payer: Medicare Other | Admitting: Cardiovascular Disease

## 2020-09-10 ENCOUNTER — Telehealth: Payer: Self-pay | Admitting: *Deleted

## 2020-09-10 VITALS — BP 124/70 | HR 84 | Ht 64.0 in | Wt 209.0 lb

## 2020-09-10 DIAGNOSIS — Z951 Presence of aortocoronary bypass graft: Secondary | ICD-10-CM | POA: Diagnosis not present

## 2020-09-10 DIAGNOSIS — E782 Mixed hyperlipidemia: Secondary | ICD-10-CM | POA: Diagnosis not present

## 2020-09-10 DIAGNOSIS — J431 Panlobular emphysema: Secondary | ICD-10-CM | POA: Diagnosis not present

## 2020-09-10 DIAGNOSIS — C3491 Malignant neoplasm of unspecified part of right bronchus or lung: Secondary | ICD-10-CM

## 2020-09-10 DIAGNOSIS — I4510 Unspecified right bundle-branch block: Secondary | ICD-10-CM | POA: Diagnosis not present

## 2020-09-10 NOTE — Patient Instructions (Signed)
Medication Instructions:  *If you need a refill on your cardiac medications before your next appointment, please call your pharmacy*   Lab Work: If you have labs (blood work) drawn today and your tests are completely normal, you will receive your results only by: [place] (if you have MyChart) OR A paper copy in the mail If you have any lab test that is abnormal or we need to change your treatment, we will call you to review the results.  Follow-Up: At [facility], you and your health needs are our priority.  As part of our continuing mission to provide you with exceptional heart care, we have created designated Provider Care Teams.  These Care Teams include your primary Cardiologist (physician) and Advanced Practice Providers (APPs -  Physician Assistants and Nurse Practitioners) who all work together to provide you with the care you need, when you need it.  We recommend signing up for the patient portal called "MyChart".  Sign up information is provided on this After Visit Summary.  MyChart is used to connect with patients for Virtual Visits (Telemedicine).  Patients are able to view lab/test results, encounter notes, upcoming appointments, etc.  Non-urgent messages can be sent to your provider as well.   To learn more about what you can do with MyChart, go to [email or web address].    Your next appointment:   6 month(s)  The format for your next appointment:   In Person  Provider:   You may see Jenkins Rouge, MD or one of the following Advanced Practice Providers on your designated Care Team:   Cecilie Kicks, NP

## 2020-09-10 NOTE — Telephone Encounter (Signed)
CALLED PATIENT TO INFORM OF CT FOR 09-21-20- ARRIVAL TIME- 9:15 AM @ [facility], NO RESTRICTIONS TO TEST, PATIENT TO RECEIVE CT RESULTS FROM DR. [name] ON 09-24-20 @ 10 AM, SPOKE WITH PATIENT AND HE IS AWARE OF THESE APPTS.

## 2020-09-14 ENCOUNTER — Telehealth: Payer: Self-pay | Admitting: *Deleted

## 2020-09-14 NOTE — Telephone Encounter (Signed)
CALLED PATIENT TO ASK ABOUT RESCHEDULING FU ON 09-24-20 DUE TO DR. [name] BEING IN THE OR, RESCHEDULED APPT. FOR 09-28-20 @ 4 PM, LVM FOR A RETURN CALL

## 2020-09-18 ENCOUNTER — Telehealth: Payer: Medicare Other | Admitting: Cardiovascular Disease

## 2020-09-21 ENCOUNTER — Other Ambulatory Visit: Payer: Self-pay

## 2020-09-21 ENCOUNTER — Ambulatory Visit (HOSPITAL_COMMUNITY)
Admission: RE | Admit: 2020-09-21 | Discharge: 2020-09-21 | Disposition: A | Discharge status: Home / Self Care | Payer: Medicare Other | Source: Ambulatory Visit | Attending: Radiation Oncology | Admitting: Radiation Oncology

## 2020-09-21 ENCOUNTER — Encounter (HOSPITAL_COMMUNITY): Payer: Self-pay

## 2020-09-21 DIAGNOSIS — C3411 Malignant neoplasm of upper lobe, right bronchus or lung: Secondary | ICD-10-CM | POA: Diagnosis present

## 2020-09-23 ENCOUNTER — Encounter: Payer: Self-pay | Admitting: Radiology

## 2020-09-24 ENCOUNTER — Ambulatory Visit: Payer: Medicare Other | Admitting: Radiation Oncology

## 2020-09-25 NOTE — Progress Notes (Signed)
Radiation Oncology         [phone] ________________________________  Name: William Dyer MRN: [id number]  Date: 09/28/2020  DOB: 1945/11/22  Follow-Up Visit Note  CC: [name]., MD  Langston Masker Lutricia Horsfall, MD    ICD-10-CM   1. Solitary pulmonary nodule  R91.1 CT CHEST WO CONTRAST    2. Malignant neoplasm of upper lobe of right lung (HCC)  C34.11       Diagnosis: Presumptive stage I bronchogenic neoplasm presenting in the medial right upper lobe (1.4 cm)  Interval Since Last Radiation:  1 years, 6 months, and 13 days  03/12/2019 through 03/19/2019 Site Technique Total Dose (Gy) Dose per Fx (Gy) Completed Fx Beam Energies  Lung, Right: Lung_Rt IMRT 54/54 18 3/3 6XFFF   Narrative:  The patient returns today for routine follow-up, he was last seen by me on 03/26/20.   Since his last visit, the patient underwent chest CT on 09/21/20. Findings from imaging demonstrated evolving post treatment changes in the right upper lobe. Appearance of post partial lung resection in the right upper lobe was visualized as well. No new or progressive findings were otherwise seen.   Of note: the patient met for consultation with Dr. Dan Europe, [facility], on 06/01/20. Per visit note; the patient reported struggling with ongoing back pain which occasionally affects his breathing. The patient was noted to be uninterested in taking any pain medications and is not a good surgical candidate.  He continues to take prescription strength ibuprofen 600 mg twice daily.  He has noticed increasing pain along the right posterior rib cage area.  He denies any trauma to this area or significant coughing.  This has been a chronic issue but has worsened significantly over the past several weeks.  Recent chest CT scan did not show any abnormalities in this area..  Visit.  Pain complaints is along his thoracotomy scar.                               Allergies:  is allergic to rosuvastatin calcium, statins,  sulfasalazine, and sulfonamide derivatives.  Meds: Current Outpatient Medications  Medication Sig Dispense Refill   albuterol (PROAIR HFA) 108 (90 Base) MCG/ACT inhaler Inhale 1-2 puffs into the lungs every 6 (six) hours as needed for wheezing or shortness of breath. 16 g 0   Alirocumab (PRALUENT) 75 MG/ML SOAJ Inject 1 pen into the skin every 14 (fourteen) days. 6 mL 3   aspirin 81 MG tablet Take 81 mg by mouth daily.     Cholecalciferol 50 MCG (2000 UT) TABS Take 2,000 mg by mouth daily.     formoterol (PERFOROMIST) 20 MCG/2ML nebulizer solution Take 2 mLs (20 mcg total) by nebulization 2 (two) times daily. Dx:J43.2 120 mL 0   gabapentin (NEURONTIN) 400 MG capsule Take 400 mg by mouth at bedtime.     guaiFENesin (MUCINEX) 600 MG 12 hr tablet Take 600 mg by mouth 2 (two) times daily as needed for to loosen phlegm. For congestion     hydrochlorothiazide (MICROZIDE) 12.5 MG capsule Take 12.5 mg by mouth daily.     ibuprofen (ADVIL) 600 MG tablet Take by mouth.     levothyroxine (SYNTHROID, LEVOTHROID) 137 MCG tablet Take 137 mcg by mouth daily.     lisinopril (ZESTRIL) 5 MG tablet Take 5 mg by mouth daily.     loratadine (CLARITIN) 10 MG tablet Take 10 mg by mouth daily  as needed for allergies or rhinitis.      metFORMIN (GLUCOPHAGE-XR) 500 MG 24 hr tablet Take 500 mg by mouth daily with breakfast.     nitroGLYCERIN (NITROSTAT) 0.4 MG SL tablet Place 1 tablet (0.4 mg total) under the tongue every 5 (five) minutes as needed for chest pain (3 doses max). 25 tablet 3   tamsulosin (FLOMAX) 0.4 MG CAPS capsule Take 0.4 mg by mouth daily after supper.      fluticasone (FLONASE) 50 MCG/ACT nasal spray Place 1 spray into both nostrils daily. (Patient not taking: Reported on 09/28/2020)     No current facility-administered medications for this encounter.    Physical Findings: The patient is in no acute distress. Patient is alert and oriented.  weight is 216 lb 12.8 oz (98.3 kg). His temperature is  97.7 F (36.5 C). His blood pressure is 159/76 (abnormal) and his pulse is 74. His respiration is 18 and oxygen saturation is 93%. .  No significant changes. Lungs are clear to auscultation bilaterally. Heart has regular rate and rhythm. No palpable cervical, supraclavicular, or axillary adenopathy. Abdomen soft, non-tender, normal bowel sounds.  Supplemental oxygen in place.  Points to tenderness along his medial aspect of his thoracotomy scar.  Palpation in this area reveals no suspicious nodularity or erythema to suggest recurrence.   Lab Findings: Lab Results  Component Value Date   WBC 16.2 (H) 05/15/2013   HGB 12.1 (L) 05/15/2013   HCT 35.6 (L) 05/15/2013   MCV 92.7 05/15/2013   PLT 223 05/15/2013    Radiographic Findings: CT CHEST WO CONTRAST  Result Date: 09/21/2020 CLINICAL DATA:  Non-small cell lung cancer, assess treatment response in a.m. 75 year old male post partial lung resection in the RIGHT chest and prior radiotherapy more recently. EXAM: CT CHEST WITHOUT CONTRAST TECHNIQUE: Multidetector CT imaging of the chest was performed following the standard protocol without IV contrast. COMPARISON:  March 05, 2020. FINDINGS: Cardiovascular: Calcified atheromatous plaque of the thoracic aorta without aneurysmal dilation. Central pulmonary vasculature mildly engorged. Post median sternotomy for CABG. Calcifications of native coronary vasculature. Mediastinum/Nodes: No mediastinal lymphadenopathy. No axillary lymphadenopathy. Esophagus grossly unremarkable and unchanged. No thoracic inlet lymphadenopathy. Lungs/Pleura: Marked pulmonary emphysema. Signs of subpleural reticulation and scarring in the RIGHT chest. Evidence of prior partial lung resection involving RIGHT lower lobe with similar appearance. Bandlike density increasing consolidation in the medial RIGHT upper lobe at the site of previous small pulmonary nodule, associated volume loss in the setting of prior radiotherapy. No sign of  pleural effusion. No new suspicious pulmonary nodule. Airways are patent. Upper Abdomen: No acute upper abdominal process. Cholelithiasis. Imaged portions of pancreas, spleen, adrenal glands and kidneys are unremarkable on very limited assessment. No acute gastrointestinal findings in the upper abdomen with colonic diverticulosis. No upper abdominal adenopathy. Musculoskeletal: Spinal degenerative changes without acute or destructive bone process. Signs of median sternotomy IMPRESSION: 1. Evolving post treatment changes in the RIGHT upper lobe. 2. Post partial lung resection in the RIGHT lower lobe. 3. No new or progressive findings. 4. Cholelithiasis. 5. Colonic diverticulosis. 6. Emphysema and aortic atherosclerosis. Aortic Atherosclerosis (ICD10-I70.0) and Emphysema (ICD10-J43.9). Electronically Signed   By: Zetta Bills M.D.   On: 09/21/2020 13:30    Impression:  Presumptive stage I bronchogenic neoplasm presenting in the medial right upper lobe (1.4 cm)  Recent chest CT scan shows good response to his SBRT treatments.  No new areas of concern.  Patient is having increasing pain along his right upper thoracotomy scar.  The etiology of this pain is unknown.  Imaging did not show any suspicious areas along this region.  I discussed potential referral to physical therapy to address this issue but he is not interested in this approach at this time.  Plan: Patient will return in 6 months for routine follow-up.  Prior to this visit Undergo a CT scan of the chest for follow-up of his SBRT treatments.   25 minutes of total time was spent for this patient encounter, including preparation, face-to-face counseling with the patient and coordination of care, physical exam, and documentation of the encounter as well as ordering of new upcoming CT scan. ____________________________________  Blair Promise, PhD, MD   This document serves as a record of services personally performed by [name], MD. It  was created on his behalf by Roney Mans, a trained medical scribe. The creation of this record is based on the scribe's personal observations and the provider's statements to them. This document has been checked and approved by the attending provider.

## 2020-09-28 ENCOUNTER — Encounter: Payer: Self-pay | Admitting: Radiation Oncology

## 2020-09-28 ENCOUNTER — Other Ambulatory Visit: Payer: Self-pay

## 2020-09-28 ENCOUNTER — Ambulatory Visit
Admission: RE | Admit: 2020-09-28 | Discharge: 2020-09-28 | Disposition: A | Discharge status: Home / Self Care | Payer: Medicare Other | Source: Ambulatory Visit | Attending: Radiation Oncology | Admitting: Radiation Oncology

## 2020-09-28 VITALS — BP 159/76 | HR 74 | Temp 97.7°F | Resp 18 | Wt 216.8 lb

## 2020-09-28 DIAGNOSIS — M549 Dorsalgia, unspecified: Secondary | ICD-10-CM | POA: Insufficient documentation

## 2020-09-28 DIAGNOSIS — J439 Emphysema, unspecified: Secondary | ICD-10-CM | POA: Insufficient documentation

## 2020-09-28 DIAGNOSIS — C3411 Malignant neoplasm of upper lobe, right bronchus or lung: Secondary | ICD-10-CM | POA: Diagnosis not present

## 2020-09-28 DIAGNOSIS — Z7982 Long term (current) use of aspirin: Secondary | ICD-10-CM | POA: Insufficient documentation

## 2020-09-28 DIAGNOSIS — R911 Solitary pulmonary nodule: Secondary | ICD-10-CM

## 2020-09-28 DIAGNOSIS — Z79899 Other long term (current) drug therapy: Secondary | ICD-10-CM | POA: Insufficient documentation

## 2020-09-28 DIAGNOSIS — Z7984 Long term (current) use of oral hypoglycemic drugs: Secondary | ICD-10-CM | POA: Diagnosis not present

## 2020-09-28 NOTE — Progress Notes (Signed)
William Dyer is here today for follow up post radiation to the lung.  Lung Side: Right side, completed treatment on 03/19/19  Does the patient complain of any of the following: Pain:Patient reports having pain to low back and area of right lung. Rating 8/10, currently taking ibuprofen which is not effective.  Shortness of breath w/wo exertion: Patient reports shortness on exertion.  Cough: no Hemoptysis: no Pain with swallowing: patient reports having some discomfort with swallowing at times.  Swallowing/choking concerns:no Appetite: good Energy Level: Patient report having a low energy level.  Post radiation skin Changes: skin intact.     Additional comments if applicable:   Vitals:   09/28/20 1537  BP: (!) 159/76  Pulse: 74  Resp: 18  Temp: 97.7 F (36.5 C)  SpO2: 93%  Weight: 216 lb 12.8 oz (98.3 kg)

## 2020-12-29 ENCOUNTER — Other Ambulatory Visit: Payer: Self-pay | Admitting: Cardiovascular Disease

## 2021-03-11 NOTE — Progress Notes (Signed)
Date:  03/16/2021   ID:  William Dyer, DOB January 07, 1946, MRN [id number]  PCP:  [name]., MD  Cardiologist:   [name] Electrophysiologist:  None   Evaluation Performed:  Follow-Up Visit  Chief Complaint:  CAD/CABG  History of Present Illness:    76 y.o. history of CABG 2013 SVG to D1, SVG to RCA and LIMA to LAD. Significant COPD followed by Dr Lake Bells Last myovue done 10/30/17 EF 65% diaphragmatic attenuation with no ischemia low risk study  Not smoking On oxygen Diagnosed with RUL adenocarcinoma and has had XRT Rx seeing [name] Activity limited by back pain Has seen neurology and not thought to be a surgical candidate CT done 09/21/20 post partial RUL lung resection with evolving post Rx changes stable  Activity severely limited by back pain Does not want to take oxycodone No angina Needs new nitro    Past Medical History:  Diagnosis Date   CAD (coronary artery disease)    Remote PCI; s/p CABG x 3 in Feb 2013   COPD (chronic obstructive pulmonary disease) ([place])    Diabetes mellitus    GERD (gastroesophageal reflux disease)    Heart murmur    History of radiation therapy 03/19/2019   IMRT right lung 03/12/2019-03/19/2019  Dr [name]   Hyperlipidemia    Hypothyroidism    Lung cancer ([name]) 07/08/2000   status post right lower lobectomy for adenocarcinoma   Obesity    Pneumonia    Past Surgical History:  Procedure Laterality Date   CORONARY ANGIOPLASTY  1990's   CORONARY ARTERY BYPASS GRAFT  03/18/2011   Procedure: CORONARY ARTERY BYPASS GRAFTING (CABG);  Surgeon: [name] William Idler, MD;  Location: [place];  Service: Open Heart Surgery;  Laterality: N/A;   INGUINAL HERNIA REPAIR  1960's   "? side"   LUNG LOBECTOMY     right, lower; "for lung cancer"   TONSILLECTOMY AND ADENOIDECTOMY  1950's     Current Meds  Medication Sig   albuterol (PROAIR HFA) 108 (90 Base) MCG/ACT inhaler Inhale 1-2 puffs into the lungs every 6 (six) hours as needed for wheezing or shortness  of breath.   Alirocumab (PRALUENT) 75 MG/ML SOAJ INJECT 1 PEN INTO THE SKIN EVERY 14 DAYS   aspirin 81 MG tablet Take 81 mg by mouth daily.   Cholecalciferol 50 MCG (2000 UT) TABS Take 2,000 mg by mouth daily.   fluticasone (FLONASE) 50 MCG/ACT nasal spray Place 1 spray into both nostrils daily.   formoterol (PERFOROMIST) 20 MCG/2ML nebulizer solution Take 2 mLs (20 mcg total) by nebulization 2 (two) times daily. Dx:J43.2   gabapentin (NEURONTIN) 400 MG capsule Take 400 mg by mouth at bedtime.   guaiFENesin (MUCINEX) 600 MG 12 hr tablet Take 600 mg by mouth 2 (two) times daily as needed for to loosen phlegm. For congestion   hydrochlorothiazide (MICROZIDE) 12.5 MG capsule Take 12.5 mg by mouth daily.   ibuprofen (ADVIL) 600 MG tablet Take by mouth.   levothyroxine (SYNTHROID, LEVOTHROID) 137 MCG tablet Take 137 mcg by mouth daily.   lisinopril (ZESTRIL) 5 MG tablet Take 5 mg by mouth daily.   loratadine (CLARITIN) 10 MG tablet Take 10 mg by mouth daily as needed for allergies or rhinitis.    metFORMIN (GLUCOPHAGE-XR) 500 MG 24 hr tablet Take 500 mg by mouth daily with breakfast.   nitroGLYCERIN (NITROSTAT) 0.4 MG SL tablet Place 1 tablet (0.4 mg total) under the tongue every 5 (five) minutes as needed for  chest pain (3 doses max).   tamsulosin (FLOMAX) 0.4 MG CAPS capsule Take 0.4 mg by mouth daily after supper.      Allergies:   Rosuvastatin calcium, Statins, Sulfasalazine, and Sulfonamide derivatives   Social History   Tobacco Use   Smoking status: Former    Packs/day: 1.00    Years: 50.00    Pack years: 50.00    Types: Cigarettes    Quit date: 02/10/2018    Years since quitting: 3.0   Smokeless tobacco: Never  Substance Use Topics   Alcohol use: No    Alcohol/week: 0.0 standard drinks   Drug use: No     Family Hx: The patient's family history includes Emphysema in an other family member; Lung cancer in an other family member.  ROS:   Please see the history of present  illness.     All other systems reviewed and are negative.   Prior CV studies:   The following studies were reviewed today:  Myovue 10/30/17  Labs/Other Tests and Data Reviewed:    EKG:  01/05/17 SR RBBB LPFB 01/28/19 ST rate 100 RBBB  03/16/2021 SR rBBB no acute changes 03/16/2021 SR rate 71 RBBB   Recent Labs: No results found for requested labs within last 8760 hours.   Recent Lipid Panel Lab Results  Component Value Date/Time   CHOL 201 (H) 03/27/2019 01:29 PM   TRIG 287 (H) 03/27/2019 01:29 PM   HDL 60 03/27/2019 01:29 PM   CHOLHDL 3.4 03/27/2019 01:29 PM   CHOLHDL 3.4 08/07/2015 08:51 AM   LDLCALC 93 03/27/2019 01:29 PM   LDLDIRECT 175 (H) 12/24/2018 12:11 PM   LDLDIRECT 72.0 03/31/2014 08:50 AM    Wt Readings from Last 3 Encounters:  03/16/21 213 lb (96.6 kg)  09/28/20 216 lb 12.8 oz (98.3 kg)  09/10/20 209 lb (94.8 kg)     Objective:    Vital Signs:  BP 120/78    Pulse 71    Ht 5\' 4"  (1.626 m)    Wt 213 lb (96.6 kg)    SpO2 95%    BMI 36.56 kg/m    Affect appropriate Chronically ill COPD HEENT: normal Neck supple with no adenopathy JVP normal no bruits no thyromegaly Lung Decreased BS RUL  Heart:  S1/S2 no murmur, no rub, gallop or click PMI normal Abdomen: benighn, BS positve, no tenderness, no AAA no bruit.  No HSM or HJR Distal pulses intact with no bruits No edema Neuro non-focal Skin warm and dry No muscular weakness    ASSESSMENT & PLAN:    CAD: CABG 2013 Non ischemic myovue 10/30/17 EF 65% no RWMA;s continue medical Rx    Chol: back on Praluent with grant assistance continue Zetia improved    DM: Discussed low carb diet.  Target hemoglobin A1c is 6.5 or less.  Continue current medications.   Lung Cancer: post RUL resection and XRT for RUL tumor f/u CT August 2022  stable f/u with Dr Valeta [name] and [name] Presumptive stage 1 bronchogenic neoplasm    Thyroid   On synthroid replacement labs with primary    RBBB  Likely related to lung disease f/u  ECG yearly    Medication Adjustments/Labs and Tests Ordered: Current medicines are reviewed at length with the patient today.  Concerns regarding medicines are outlined above.   Tests Ordered:  None   Medication Changes:  None   Disposition:  Follow up in a year   Signed, Jenkins Rouge, MD  03/16/2021 9:36 AM  [facility]

## 2021-03-16 ENCOUNTER — Ambulatory Visit: Payer: Medicare Other | Admitting: Cardiovascular Disease

## 2021-03-16 ENCOUNTER — Encounter: Payer: Self-pay | Admitting: Cardiovascular Disease

## 2021-03-16 ENCOUNTER — Other Ambulatory Visit: Payer: Self-pay

## 2021-03-16 VITALS — BP 120/78 | HR 71 | Ht 64.0 in | Wt 213.0 lb

## 2021-03-16 DIAGNOSIS — J431 Panlobular emphysema: Secondary | ICD-10-CM

## 2021-03-16 DIAGNOSIS — E785 Hyperlipidemia, unspecified: Secondary | ICD-10-CM

## 2021-03-16 DIAGNOSIS — I4510 Unspecified right bundle-branch block: Secondary | ICD-10-CM

## 2021-03-16 DIAGNOSIS — C3491 Malignant neoplasm of unspecified part of right bronchus or lung: Secondary | ICD-10-CM | POA: Diagnosis not present

## 2021-03-16 DIAGNOSIS — Z951 Presence of aortocoronary bypass graft: Secondary | ICD-10-CM

## 2021-03-16 MED ORDER — NITROGLYCERIN 0.4 MG SL SUBL: 0.4000 mg | SUBLINGUAL_TABLET | SUBLINGUAL | 3 refills | Status: AC | PRN

## 2021-03-16 NOTE — Patient Instructions (Signed)
Medication Instructions:  Your physician recommends that you continue on your current medications as directed. Please refer to the Current Medication list given to you today.  *If you need a refill on your cardiac medications before your next appointment, please call your pharmacy*  Lab Work: If you have labs (blood work) drawn today and your tests are completely normal, you will receive your results only by: [place] (if you have MyChart) OR A paper copy in the mail If you have any lab test that is abnormal or we need to change your treatment, we will call you to review the results.  Follow-Up: At [facility], you and your health needs are our priority.  As part of our continuing mission to provide you with exceptional heart care, we have created designated Provider Care Teams.  These Care Teams include your primary Cardiologist (physician) and Advanced Practice Providers (APPs -  Physician Assistants and Nurse Practitioners) who all work together to provide you with the care you need, when you need it.  We recommend signing up for the patient portal called "MyChart".  Sign up information is provided on this After Visit Summary.  MyChart is used to connect with patients for Virtual Visits (Telemedicine).  Patients are able to view lab/test results, encounter notes, upcoming appointments, etc.  Non-urgent messages can be sent to your provider as well.   To learn more about what you can do with MyChart, go to [email or web address].    Your next appointment:   12 month(s)  The format for your next appointment:   In Person  Provider:   Jenkins Rouge, MD {

## 2021-03-19 ENCOUNTER — Telehealth: Payer: Self-pay | Admitting: Cardiovascular Disease

## 2021-03-19 NOTE — Telephone Encounter (Signed)
New Message:      Please call him, concerning getting pt assistance with his Praluent.

## 2021-03-22 ENCOUNTER — Other Ambulatory Visit: Payer: Self-pay | Admitting: Cardiovascular Disease

## 2021-03-22 NOTE — Telephone Encounter (Signed)
Called and spoke w/pharmacy team regarding the praluent cost after providing the hwf grant it brought the cost down to $0.

## 2021-03-22 NOTE — Telephone Encounter (Signed)
[facility] grnt approved and will call pt after 8 am but the card information is as follows: [place]   START DATE 02/20/2021   END DATE 02/19/2022   ASSISTANCE TYPE Co-pay   PAID $0.00   PENDING $0.00   BALANCE $2500.00 Pharmacy Card CARD NO. [id number]   CARD STATUS Active   BIN [id number]   PCN [id number]   PC GROUP [id number]   HELP DESK [phone]

## 2021-03-31 ENCOUNTER — Ambulatory Visit (HOSPITAL_COMMUNITY)
Admission: RE | Admit: 2021-03-31 | Discharge: 2021-03-31 | Disposition: A | Discharge status: Home / Self Care | Payer: Medicare Other | Source: Ambulatory Visit | Attending: Radiation Oncology | Admitting: Radiation Oncology

## 2021-03-31 ENCOUNTER — Other Ambulatory Visit: Payer: Self-pay

## 2021-03-31 DIAGNOSIS — R911 Solitary pulmonary nodule: Secondary | ICD-10-CM

## 2021-04-02 NOTE — Progress Notes (Signed)
William Dyer is here today for follow up post radiation to the lung.  Lung Side: right  Completed radiation treatment on: 03/19/2019  Does the patient complain of any of the following: Pain:2/10 low back pain and right lung pain Shortness of breath w/wo exertion: shortness of breath with exertion, uses oxygen 2LPNC at night and as needed Cough: denies Hemoptysis: denies Pain with swallowing: occasionally but goes away quickly Swallowing/choking concerns: denies Appetite: good Energy Level: good    Additional comments if applicable: nothing of note  Vitals:   04/05/21 1142  BP: 138/65  Pulse: 71  Resp: 20  Temp: (!) 97.2 F (36.2 C)  TempSrc: Temporal  SpO2: 93%  Weight: 212 lb 4 oz (96.3 kg)  Height: 5\' 4"  (1.626 m)

## 2021-04-04 NOTE — Progress Notes (Signed)
Radiation Oncology         [phone] ________________________________  Name: William Dyer MRN: [id number]  Date: 04/05/2021  DOB: 04/09/1945  Follow-Up Visit Note  CC: [name]., MD  Langston Masker Lutricia Horsfall, MD    ICD-10-CM   1. Solitary pulmonary nodule  R91.1 CT CHEST WO CONTRAST    2. Malignant neoplasm of upper lobe of right lung (HCC)  C34.11       Diagnosis: Presumptive stage I bronchogenic neoplasm presenting in the medial right upper lobe (1.4 cm)  Interval Since Last Radiation: 2 years and 18 days   Treatment Dates: 03/12/2019 through 03/19/2019 Site Technique Total Dose (Gy) Dose per Fx (Gy) Completed Fx Beam Energies  Lung, Right: Lung_Rt IMRT 54/54 18 3/3 6XFFF    Narrative:  The patient returns today for routine follow-up and to review recent imaging, he was last seen here for follow up on 09/28/20.         Since his last visit, the patient established care with Dr. Dan Europe, [facility] pulmonology, on 12/01/20. During which time, the patient reported no new or worsening respiratory symptoms (baseline SOB). In regards to his severe COPD the patient was instructed to continue on bravana and pulmicort nebs and albuterol. For his chronic respiratory failure, he was advised to continue on 2L O2 at night.        Chest CT on 03/31/21 demonstrated stable appearance of the chest with chronic postradiation changes in the right upper lobe and prior wedge resection in the right lower lobe, with no findings to suggest locally recurrent disease or new metastatic disease in the thorax. CT also showed diffuse bronchial wall thickening with moderate centrilobular and paraseptal emphysema; suggestive of underlying COPD.   Otherwise, no significant interval history since the patient was last seen.  He has chronic intermittent pain near his right thoracotomy scar.  He denies any significant changes in his breathing recently.  He denies any significant cough or hemoptysis.                Allergies:  is allergic to rosuvastatin calcium, statins, sulfasalazine, and sulfonamide derivatives.  Meds: Current Outpatient Medications  Medication Sig Dispense Refill   acetaminophen (TYLENOL) 325 MG tablet Take by mouth.     Alirocumab (PRALUENT) 75 MG/ML SOAJ INJECT 1 PEN INTO THE SKIN EVERY 14 DAYS 2 mL 11   aspirin 81 MG tablet Take 81 mg by mouth daily.     Cholecalciferol 50 MCG (2000 UT) TABS Take 2,000 mg by mouth daily.     clotrimazole-betamethasone (LOTRISONE) cream SMARTSIG:Right Ear Every Morning     fluticasone (FLONASE) 50 MCG/ACT nasal spray Place 1 spray into both nostrils daily.     formoterol (PERFOROMIST) 20 MCG/2ML nebulizer solution Take 2 mLs (20 mcg total) by nebulization 2 (two) times daily. Dx:J43.2 120 mL 0   gabapentin (NEURONTIN) 400 MG capsule Take 400 mg by mouth at bedtime.     guaiFENesin (MUCINEX) 600 MG 12 hr tablet Take 600 mg by mouth 2 (two) times daily as needed for to loosen phlegm. For congestion     hydrochlorothiazide (MICROZIDE) 12.5 MG capsule Take 12.5 mg by mouth daily.     levothyroxine (SYNTHROID, LEVOTHROID) 137 MCG tablet Take 137 mcg by mouth daily.     lisinopril (ZESTRIL) 5 MG tablet Take 5 mg by mouth daily.     loratadine (CLARITIN) 10 MG tablet Take 10 mg by mouth daily as needed for allergies or rhinitis.  metFORMIN (GLUCOPHAGE-XR) 500 MG 24 hr tablet Take 500 mg by mouth daily with breakfast.     nitroGLYCERIN (NITROSTAT) 0.4 MG SL tablet Place 1 tablet (0.4 mg total) under the tongue every 5 (five) minutes as needed for chest pain (3 doses max). 25 tablet 3   tamsulosin (FLOMAX) 0.4 MG CAPS capsule Take 0.4 mg by mouth daily after supper.      albuterol (PROAIR HFA) 108 (90 Base) MCG/ACT inhaler Inhale 1-2 puffs into the lungs every 6 (six) hours as needed for wheezing or shortness of breath. (Patient not taking: Reported on 04/05/2021) 16 g 0   ibuprofen (ADVIL) 600 MG tablet Take by mouth. (Patient not taking: Reported on  04/05/2021)     No current facility-administered medications for this encounter.    Physical Findings: The patient is in no acute distress. Patient is alert and oriented.  height is 5\' 4"  (1.626 m) and weight is 212 lb 4 oz (96.3 kg). His temporal temperature is 97.2 F (36.2 C) (abnormal). His blood pressure is 138/65 and his pulse is 71. His respiration is 20 and oxygen saturation is 93%. .   Lungs are clear to auscultation bilaterally. Heart has regular rate and rhythm. No palpable cervical, supraclavicular, or axillary adenopathy. Abdomen soft, non-tender, normal bowel sounds.  Palpation along his right lower thoracotomy scar reveals no suspicious nodularity.   Lab Findings: Lab Results  Component Value Date   WBC 16.2 (H) 05/15/2013   HGB 12.1 (L) 05/15/2013   HCT 35.6 (L) 05/15/2013   MCV 92.7 05/15/2013   PLT 223 05/15/2013    Radiographic Findings: CT CHEST WO CONTRAST  Result Date: 04/02/2021 CLINICAL DATA:  76 year old male with history of non-small cell lung cancer diagnosed in 2021 status post radiation therapy (completed 2021). Follow-up study. * onc * EXAM: CT CHEST WITHOUT CONTRAST TECHNIQUE: Multidetector CT imaging of the chest was performed following the standard protocol without IV contrast. RADIATION DOSE REDUCTION: This exam was performed according to the departmental dose-optimization program which includes automated exposure control, adjustment of the mA and/or kV according to patient size and/or use of iterative reconstruction technique. COMPARISON:  Chest CT 09/21/2020. FINDINGS: Cardiovascular: Heart size is normal. There is no significant pericardial fluid, thickening or pericardial calcification. There is aortic atherosclerosis, as well as atherosclerosis of the great vessels of the mediastinum and the coronary arteries, including calcified atherosclerotic plaque in the left main, left anterior descending, left circumflex and right coronary arteries. Status post  median sternotomy for CABG including LIMA to the LAD. Mediastinum/Nodes: No pathologically enlarged mediastinal or hilar lymph nodes. Please note that accurate exclusion of hilar adenopathy is limited on noncontrast CT scans. Esophagus is unremarkable in appearance. No axillary lymphadenopathy. Lungs/Pleura: Chronic volume loss and architectural distortion in the medial aspect of the right upper lobe, compatible with an area of chronic postradiation fibrosis, stable compared to the prior examination. Postoperative changes of remote wedge resection again noted in the right lower lobe. No new suspicious appearing pulmonary nodules or masses are noted. No acute consolidative airspace disease. No pleural effusions. Diffuse bronchial wall thickening with moderate centrilobular and paraseptal emphysema. Upper Abdomen: Aortic atherosclerosis. Diffuse low attenuation throughout the visualized hepatic parenchyma, indicative of a background of hepatic steatosis. There is some intermediate attenuation material lying dependently in the gallbladder, likely to represent biliary sludge and/or noncalcified gallstones. Numerous colonic diverticulae are noted in the region of the transverse colon. Musculoskeletal: Median sternotomy wires are noted. Postthoracotomy changes in the right chest wall  are incidentally noted. There are no aggressive appearing lytic or blastic lesions noted in the visualized portions of the skeleton. IMPRESSION: 1. Stable appearance of the chest with chronic postradiation changes in the right upper lobe and prior wedge resection in the right lower lobe with no findings to suggest locally recurrent disease or new metastatic disease in the thorax. 2. Diffuse bronchial wall thickening with moderate centrilobular and paraseptal emphysema; imaging findings suggestive of underlying COPD. 3. Aortic atherosclerosis, in addition to left main and three-vessel coronary artery disease. Status post median sternotomy for  CABG including LIMA to the LAD. 4. Hepatic steatosis. 5. Biliary sludge and/or noncalcified gallstones in the gallbladder. 6. Colonic diverticulosis. Aortic Atherosclerosis (ICD10-I70.0) and Emphysema (ICD10-J43.9). Electronically Signed   By: Vinnie Langton M.D.   On: 04/02/2021 09:41    Impression: Presumptive stage I bronchogenic neoplasm presenting in the medial right upper lobe (1.4 cm)  No evidence of recurrence on clinical exam today.  Recent chest CT scan also very encouraging.  He has chronic respiratory issues as above related to his COPD.  Plan: Routine follow-up in 6 months.  Prior to his follow-up appointment the patient will undergo a repeat chest CT scan.   20 minutes of total time was spent for this patient encounter, including preparation, face-to-face counseling with the patient and coordination of care, physical exam, and documentation of the encounter. ____________________________________  Blair Promise, PhD, MD   This document serves as a record of services personally performed by [name], MD. It was created on his behalf by Roney Mans, a trained medical scribe. The creation of this record is based on the scribe's personal observations and the provider's statements to them. This document has been checked and approved by the attending provider.

## 2021-04-05 ENCOUNTER — Other Ambulatory Visit: Payer: Self-pay

## 2021-04-05 ENCOUNTER — Encounter: Payer: Self-pay | Admitting: Radiation Oncology

## 2021-04-05 ENCOUNTER — Ambulatory Visit
Admission: RE | Admit: 2021-04-05 | Discharge: 2021-04-05 | Disposition: A | Discharge status: Home / Self Care | Payer: Medicare Other | Source: Ambulatory Visit | Attending: Radiation Oncology | Admitting: Radiation Oncology

## 2021-04-05 VITALS — BP 138/65 | HR 71 | Temp 97.2°F | Resp 20 | Ht 64.0 in | Wt 212.2 lb

## 2021-04-05 DIAGNOSIS — I2510 Atherosclerotic heart disease of native coronary artery without angina pectoris: Secondary | ICD-10-CM | POA: Insufficient documentation

## 2021-04-05 DIAGNOSIS — Z79899 Other long term (current) drug therapy: Secondary | ICD-10-CM | POA: Insufficient documentation

## 2021-04-05 DIAGNOSIS — J9610 Chronic respiratory failure, unspecified whether with hypoxia or hypercapnia: Secondary | ICD-10-CM | POA: Insufficient documentation

## 2021-04-05 DIAGNOSIS — R918 Other nonspecific abnormal finding of lung field: Secondary | ICD-10-CM | POA: Diagnosis not present

## 2021-04-05 DIAGNOSIS — Z923 Personal history of irradiation: Secondary | ICD-10-CM | POA: Insufficient documentation

## 2021-04-05 DIAGNOSIS — R911 Solitary pulmonary nodule: Secondary | ICD-10-CM

## 2021-04-05 DIAGNOSIS — C3411 Malignant neoplasm of upper lobe, right bronchus or lung: Secondary | ICD-10-CM

## 2021-04-05 DIAGNOSIS — Z7984 Long term (current) use of oral hypoglycemic drugs: Secondary | ICD-10-CM | POA: Insufficient documentation

## 2021-04-05 DIAGNOSIS — I700 Atherosclerosis of aorta: Secondary | ICD-10-CM | POA: Diagnosis not present

## 2021-04-05 DIAGNOSIS — J432 Centrilobular emphysema: Secondary | ICD-10-CM | POA: Diagnosis not present

## 2021-04-05 DIAGNOSIS — Z7982 Long term (current) use of aspirin: Secondary | ICD-10-CM | POA: Diagnosis not present

## 2021-05-25 ENCOUNTER — Telehealth: Payer: Self-pay | Admitting: Cardiovascular Disease

## 2021-05-25 NOTE — Progress Notes (Deleted)
?Cardiology Office Note:   ? ?Date:  05/25/2021  ? ?ID:  William Dyer, DOB November 18, 1945, MRN [id number] ? ?PCP:  William Dyer., MD ?  ?[facility] Providers ?Cardiologist:  William Rouge, MD { ? ? ?Referring MD: William Dyer., MD  ? ? ?History of Present Illness:   ? ?William Dyer is a 76 y.o. male with a hx of CAD s/p CABG 2013 SVG to D1, SVG to RCA and LIMA to LAD, DMII, HTN, HLD and COPD who is followed by William Dyer who presents to clinic as urgent visit for tachycardia. ? ?Today, ** ? ?Past Medical History:  ?Diagnosis Date  ? CAD (coronary artery disease)   ? Remote PCI; s/p CABG x 3 in Feb 2013  ? COPD (chronic obstructive pulmonary disease) ([place])   ? Diabetes mellitus   ? GERD (gastroesophageal reflux disease)   ? Heart murmur   ? History of radiation therapy 03/19/2019  ? IMRT right lung 03/12/2019-03/19/2019  Dr William Dyer  ? Hyperlipidemia   ? Hypothyroidism   ? Lung cancer ([place]) 07/08/2000  ? status post right lower lobectomy for adenocarcinoma  ? Obesity   ? Pneumonia   ? ? ?Past Surgical History:  ?Procedure Laterality Date  ? CORONARY ANGIOPLASTY  1990's  ? CORONARY ARTERY BYPASS GRAFT  03/18/2011  ? Procedure: CORONARY ARTERY BYPASS GRAFTING (CABG);  Surgeon: William Aquas [name], MD;  Location: [facility];  Service: Open Heart Surgery;  Laterality: N/A;  ? INGUINAL HERNIA REPAIR  1960's  ? "? side"  ? LUNG LOBECTOMY    ? right, lower; "for lung cancer"  ? TONSILLECTOMY AND ADENOIDECTOMY  1950's  ? ? ?Current Medications: ?No outpatient medications have been marked as taking for the 05/27/21 encounter (Appointment) with William Bergeron, MD.  ?  ? ?Allergies:   Rosuvastatin calcium, Statins, Sulfasalazine, and Sulfonamide derivatives  ? ?Social History  ? ?Socioeconomic History  ? Marital status: Widowed  ?  Spouse name: Not on file  ? Number of children: Not on file  ? Years of education: Not on file  ? Highest education level: Not on file  ?Occupational History  ? Not on file  ?Tobacco Use  ?  Smoking status: Former  ?  Packs/day: 1.00  ?  Years: 50.00  ?  Pack years: 50.00  ?  Types: Cigarettes  ?  Quit date: 02/10/2018  ?  Years since quitting: 3.2  ? Smokeless tobacco: Never  ?Substance and Sexual Activity  ? Alcohol use: No  ?  Alcohol/week: 0.0 standard drinks  ? Drug use: No  ? Sexual activity: Not Currently  ?Other Topics Concern  ? Not on file  ?Social History Narrative  ? Not on file  ? ?Social Determinants of Health  ? ?Financial Resource Strain: Not on file  ?Food Insecurity: Not on file  ?Transportation Needs: Not on file  ?Physical Activity: Not on file  ?Stress: Not on file  ?Social Connections: Not on file  ?  ? ?Family History: ?The patient's ***family history includes Emphysema in an other family member; Lung cancer in an other family member. ? ?ROS:   ?Please see the history of present illness.    ?*** All other systems reviewed and are negative. ? ?EKGs/Labs/Other Studies Reviewed:   ? ?The following studies were reviewed today: ?*** ? ?EKG:  EKG is *** ordered today.  The ekg ordered today demonstrates *** ? ?Recent Labs: ?No results found for requested labs within last 8760  hours.  ?Recent Lipid Panel ?   ?Component Value Date/Time  ? CHOL 201 (H) 03/27/2019 1329  ? TRIG 287 (H) 03/27/2019 1329  ? HDL 60 03/27/2019 1329  ? CHOLHDL 3.4 03/27/2019 1329  ? CHOLHDL 3.4 08/07/2015 0851  ? VLDL 37 (H) 08/07/2015 0851  ? [place] 93 03/27/2019 1329  ? LDLDIRECT 175 (H) 12/24/2018 1211  ? LDLDIRECT 72.0 03/31/2014 0850  ? ? ? ?Risk Assessment/Calculations:   ?{Does this patient have ATRIAL FIBRILLATION?[phone]} ? ?    ? ?Physical Exam:   ? ?VS:  There were no vitals taken for this visit.   ? ?Wt Readings from Last 3 Encounters:  ?04/05/21 212 lb 4 oz (96.3 kg)  ?03/16/21 213 lb (96.6 kg)  ?09/28/20 216 lb 12.8 oz (98.3 kg)  ?  ? ?GEN: *** Well nourished, well developed in no acute distress ?HEENT: Normal ?NECK: No JVD; No carotid bruits ?LYMPHATICS: No lymphadenopathy ?CARDIAC: ***RRR, no  murmurs, rubs, gallops ?RESPIRATORY:  Clear to auscultation without rales, wheezing or rhonchi  ?ABDOMEN: Soft, non-tender, non-distended ?MUSCULOSKELETAL:  No edema; No deformity  ?SKIN: Warm and dry ?NEUROLOGIC:  Alert and oriented x 3 ?PSYCHIATRIC:  Normal affect  ? ?ASSESSMENT:   ? ?No diagnosis found. ?PLAN:   ? ?In order of problems listed above: ? ?#Tachycardia: ? ?#CAD s/p CABG in 2013: ?-Continue ASA 81mg  daily ?-Continue praulent  ?-Continue lisinopril 5mg  daily ? ?#HLD: ?-Continue praulent  ?-Continue lisinopril 5mg  daily ? ?#HTN: ?-Continue lisinopril 5mg  daily ?-Continue HCTZ 12.5mg  daily ? ?#DMII: ?-Continue metformin ? ?#Lung Cancer s/p RUL resection and XRT ? ?   ? ?{Are you ordering a CV Procedure (e.g. stress test, cath, DCCV, TEE, etc)?   Press F2        :[id number]}  ? ? ?Medication Adjustments/Labs and Tests Ordered: ?Current medicines are reviewed at length with the patient today.  Concerns regarding medicines are outlined above.  ?No orders of the defined types were placed in this encounter. ? ?No orders of the defined types were placed in this encounter. ? ? ?There are no Patient Instructions on file for this visit.  ? ?Signed, ?William Bergeron, MD  ?05/25/2021 7:42 PM    ?William Dyer ?

## 2021-05-25 NOTE — Telephone Encounter (Signed)
STAT if HR is under 50 or over 120 ?(normal HR is 60-100 beats per minute) ? ?What is your heart rate? 141 ? ?Do you have a log of your heart rate readings (document readings)?  ? ?Do you have any other symptoms? Patient had a little headache yesterday but does not have one now.  ? ?Patient said his HR has been fluctuating since last week  ?

## 2021-05-25 NOTE — Telephone Encounter (Signed)
Spoke with patient who states that beginning "mid week last week" he noticed on his pulse ox that his HR was jumping all over the place. He states it has been as low as 49-150. He states that he can't feel it, but checks his o2 for his COPD and noticed it's constantly fluctuating. Pt cannot verify if his SOB has worsened because he states it's bad all the time due to COPD, but denies increased [place] of oxygen, nebulizer treatments or rescue inhaler. Pt denies chest pain, but then states he had one episode "one time the other day that I had a pain on the left side under my breast bone, more towards my side." Pt states he didn't do anything about this and the pain went away. Denies swelling, headaches, or increased cough. Pt does state that his head yesterday "didn't feel right, feels sort of flushed," but couldn't get more elaboration on that feeling from him. Pt's o2 at time of call is 92% which he states is about normal, but does admit that he can drop into the low 80's with little-to-no activity and stay there a few minutes before it comes back up. Spoke with DOD Gasper Sells who advised that pt is likely in A-fib and he would like a monitor, but that since pt cannot clearly state if he is having symptoms, he should be evaluated at the ED or be seen by DOD if he refuses that plan. He advised to educate pt that if his HR got to 180 and remained there, it was time to call 911. Pt states he will not to to ED because they killed his brother who had a-fib by giving him a medication "that dried his lungs up." Placed pt on next available DOD slot with Dr Johney Frame on Thursday 4/20 at 9:20. Per Dr Gasper Sells the DOD on Thursday is next best option, plan B, if pt will not go to ED. Pt is aware and agrees to plan. Will route to primary cardiologist and Dr Johney Frame as [place]. ?

## 2021-05-27 ENCOUNTER — Encounter: Payer: Self-pay | Admitting: Cardiology

## 2021-05-27 ENCOUNTER — Ambulatory Visit: Payer: Medicare Other | Admitting: Cardiology

## 2021-05-27 VITALS — BP 142/80 | HR 109 | Ht 64.0 in | Wt 211.8 lb

## 2021-05-27 DIAGNOSIS — Z951 Presence of aortocoronary bypass graft: Secondary | ICD-10-CM

## 2021-05-27 DIAGNOSIS — E785 Hyperlipidemia, unspecified: Secondary | ICD-10-CM

## 2021-05-27 DIAGNOSIS — I25810 Atherosclerosis of coronary artery bypass graft(s) without angina pectoris: Secondary | ICD-10-CM | POA: Diagnosis not present

## 2021-05-27 DIAGNOSIS — I4892 Unspecified atrial flutter: Secondary | ICD-10-CM

## 2021-05-27 DIAGNOSIS — I4510 Unspecified right bundle-branch block: Secondary | ICD-10-CM

## 2021-05-27 DIAGNOSIS — E782 Mixed hyperlipidemia: Secondary | ICD-10-CM

## 2021-05-27 MED ORDER — APIXABAN 5 MG PO TABS
5.0000 mg | ORAL_TABLET | Freq: Two times a day (BID) | ORAL | 5 refills | Status: DC
Start: 2021-05-27 — End: 2022-06-10

## 2021-05-27 MED ORDER — DILTIAZEM HCL ER COATED BEADS 120 MG PO CP24: 120.0000 mg | ORAL_CAPSULE | Freq: Every day | ORAL | 3 refills | Status: DC

## 2021-05-27 NOTE — Progress Notes (Signed)
?Cardiology Office Note:   ? ?Date:  05/27/2021  ? ?ID:  William Dyer, DOB 1945-05-30, MRN [id number] ? ?PCP:  [name]., MD ?  ?[facility] Providers ?Cardiologist:  Jenkins Rouge, MD { ? ? ?Referring MD: [name]., MD  ? ? ?History of Present Illness:   ? ?William Dyer is a 76 y.o. male with a hx of CAD s/p CABG 2013 SVG to D1, SVG to RCA and LIMA to LAD, DMII, HTN, HLD and COPD who is followed by Dr. [name] who presents to clinic as urgent visit for tachycardia found to be in new aflutter with episodes of RVR at home.  ? ?He called the office 05/25/2021 complaining of varying heart rates from 49-150 bpm per his pulse ox. He was unable to feel his heart beats and did not know if his SOB worsened due to having COPD. Also noted that his O2 was normally around 92%, but could drop into the low 80s with little to no activity for a few minutes. ? ?Today, the patient states that he is feeling alright. His main concern is that his heart rate continues to fluctuate. Notably he is in [place] with variable block today (new diagnosis). Lately he believes he is feeling more short winded than usual, but there is no associated palpitations or edema.  ? ?Last night he was sitting in his recliner, and his pulse ox read a heart rate of 141 bpm and his oxygen dropped to 85%. He then used his oxygen until he went to bed and used his CPAP. He sleeps with his CPAP nightly. He does not need to stay propped up while lying down, and he denies any PND. ? ?Normally he does not use oxygen during the day. He opted to use it today as he knew he would have some difficulty walking to the office from the parking lot. ? ?At times he is lightheaded, which he describes as feeling like "he is kind of in a daze," or like his head is "not as clear" as it should be. This was ongoing prior to noticing his changing heart rates.  ? ?He denies any chest pain, headaches, syncope, or orthopnea. ? ?In his family, his brother died of atrial  fibrillation complications 5 years ago. ? ?Last week he saw a neurologist. For his chronic back issues he was started on hydrocodone. ? ?Past Medical History:  ?Diagnosis Date  ? CAD (coronary artery disease)   ? Remote PCI; s/p CABG x 3 in Feb 2013  ? COPD (chronic obstructive pulmonary disease) ([place])   ? Diabetes mellitus   ? GERD (gastroesophageal reflux disease)   ? Heart murmur   ? History of radiation therapy 03/19/2019  ? IMRT right lung 03/12/2019-03/19/2019  Dr [name]  ? Hyperlipidemia   ? Hypothyroidism   ? Lung cancer ([place]) 07/08/2000  ? status post right lower lobectomy for adenocarcinoma  ? Obesity   ? Pneumonia   ? ? ?Past Surgical History:  ?Procedure Laterality Date  ? CORONARY ANGIOPLASTY  1990's  ? CORONARY ARTERY BYPASS GRAFT  03/18/2011  ? Procedure: CORONARY ARTERY BYPASS GRAFTING (CABG);  Surgeon: [name] [name], MD;  Location: [facility];  Service: Open Heart Surgery;  Laterality: N/A;  ? INGUINAL HERNIA REPAIR  1960's  ? "? side"  ? LUNG LOBECTOMY    ? right, lower; "for lung cancer"  ? TONSILLECTOMY AND ADENOIDECTOMY  1950's  ? ? ?Current Medications: ?Current Meds  ?Medication Sig  ?  acetaminophen (TYLENOL) 325 MG tablet Take by mouth.  ? albuterol (PROAIR HFA) 108 (90 Base) MCG/ACT inhaler Inhale 1-2 puffs into the lungs every 6 (six) hours as needed for wheezing or shortness of breath.  ? Alirocumab (PRALUENT) 75 MG/ML SOAJ INJECT 1 PEN INTO THE SKIN EVERY 14 DAYS  ? apixaban (ELIQUIS) 5 MG TABS tablet Take 1 tablet (5 mg total) by mouth 2 (two) times daily.  ? Cholecalciferol 50 MCG (2000 UT) TABS Take 2,000 mg by mouth daily.  ? clotrimazole-betamethasone (LOTRISONE) cream SMARTSIG:Right Ear Every Morning  ? diltiazem (CARDIZEM CD) 120 MG 24 hr capsule Take 1 capsule (120 mg total) by mouth daily.  ? fluticasone (FLONASE) 50 MCG/ACT nasal spray Place 1 spray into both nostrils daily.  ? formoterol (PERFOROMIST) 20 MCG/2ML nebulizer solution Take 2 mLs (20 mcg total) by nebulization 2  (two) times daily. Dx:J43.2  ? gabapentin (NEURONTIN) 400 MG capsule Take 400 mg by mouth at bedtime.  ? guaiFENesin (MUCINEX) 600 MG 12 hr tablet Take 600 mg by mouth 2 (two) times daily as needed for to loosen phlegm. For congestion  ? hydrochlorothiazide (MICROZIDE) 12.5 MG capsule Take 12.5 mg by mouth daily.  ? levothyroxine (SYNTHROID, LEVOTHROID) 137 MCG tablet Take 137 mcg by mouth daily.  ? lisinopril (ZESTRIL) 5 MG tablet Take 5 mg by mouth daily.  ? loratadine (CLARITIN) 10 MG tablet Take 10 mg by mouth daily as needed for allergies or rhinitis.   ? metFORMIN (GLUCOPHAGE-XR) 500 MG 24 hr tablet Take 500 mg by mouth daily with breakfast.  ? nitroGLYCERIN (NITROSTAT) 0.4 MG SL tablet Place 1 tablet (0.4 mg total) under the tongue every 5 (five) minutes as needed for chest pain (3 doses max).  ? tamsulosin (FLOMAX) 0.4 MG CAPS capsule Take 0.4 mg by mouth daily after supper.   ? [DISCONTINUED] aspirin 81 MG tablet Take 81 mg by mouth daily.  ? [DISCONTINUED] ibuprofen (ADVIL) 600 MG tablet Take by mouth.  ?  ? ?Allergies:   Rosuvastatin calcium, Statins, Sulfasalazine, and Sulfonamide derivatives  ? ?Social History  ? ?Socioeconomic History  ? Marital status: Widowed  ?  Spouse name: Not on file  ? Number of children: Not on file  ? Years of education: Not on file  ? Highest education level: Not on file  ?Occupational History  ? Not on file  ?Tobacco Use  ? Smoking status: Former  ?  Packs/day: 1.00  ?  Years: 50.00  ?  Pack years: 50.00  ?  Types: Cigarettes  ?  Quit date: 02/10/2018  ?  Years since quitting: 3.2  ? Smokeless tobacco: Never  ?Substance and Sexual Activity  ? Alcohol use: No  ?  Alcohol/week: 0.0 standard drinks  ? Drug use: No  ? Sexual activity: Not Currently  ?Other Topics Concern  ? Not on file  ?Social History Narrative  ? Not on file  ? ?Social Determinants of Health  ? ?Financial Resource Strain: Not on file  ?Food Insecurity: Not on file  ?Transportation Needs: Not on file  ?Physical  Activity: Not on file  ?Stress: Not on file  ?Social Connections: Not on file  ?  ? ?Family History: ?The patient's family history includes Emphysema in an other family member; Lung cancer in an other family member. ? ?ROS:   ?Review of Systems  ?Constitutional:  Negative for diaphoresis.  ?HENT:  Negative for congestion and ear pain.   ?Eyes:  Negative for blurred vision.  ?Respiratory:  Positive  for shortness of breath. Negative for sputum production.   ?Cardiovascular:  Negative for chest pain, palpitations, orthopnea, claudication, leg swelling and PND.  ?Gastrointestinal:  Negative for blood in stool and heartburn.  ?Genitourinary:  Negative for hematuria.  ?Musculoskeletal:  Positive for back pain.  ?Neurological:  Positive for dizziness. Negative for loss of consciousness.  ?Endo/Heme/Allergies:  Negative for polydipsia.  ?Psychiatric/Behavioral:  Negative for depression. The patient is not nervous/anxious.   ? ? ?EKGs/Labs/Other Studies Reviewed:   ? ?The following studies were reviewed today: ? ?CT Chest 03/31/2021: ?FINDINGS: ?Cardiovascular: Heart size is normal. There is no significant ?pericardial fluid, thickening or pericardial calcification. There is ?aortic atherosclerosis, as well as atherosclerosis of the great ?vessels of the mediastinum and the coronary arteries, including ?calcified atherosclerotic plaque in the left main, left anterior ?descending, left circumflex and right coronary arteries. Status post ?median sternotomy for CABG including LIMA to the LAD. ?  ?Mediastinum/Nodes: No pathologically enlarged mediastinal or hilar ?lymph nodes. Please note that accurate exclusion of hilar adenopathy ?is limited on noncontrast CT scans. Esophagus is unremarkable in ?appearance. No axillary lymphadenopathy. ?  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 Postoperative changes of remote wedge resection again ?noted in the right lower lobe. No new suspicious appearing pulmonary ?nodules or masses are noted. No acute consolidative airspace

## 2021-05-27 NOTE — Patient Instructions (Addendum)
Medication Instructions:  ? ?STOP TAKING ASPIRIN NOW ? ?STOP TAKING IBUPROFEN NOW ? ?START TAKING ELIQUIS 5 MG BY MOUTH TWICE DAILY ? ?START TAKING CARDIZEM CD 120 MG BY MOUTH DAILY ? ?*If you need a refill on your cardiac medications before your next appointment, please call your pharmacy* ? ? ?Testing/Procedures: ? ?Your physician has requested that you have an echocardiogram. Echocardiography is a painless test that uses sound waves to create images of your heart. It provides your doctor with information about the size and shape of your heart and how well your heart?s chambers and valves are working. This procedure takes approximately one hour. There are no restrictions for this procedure. ? ? ?Follow-Up: ? ?SEE DR. NISHAN (PRIMARY CARDIOLOGIST) ON Jun 10, 2021 AT THE 11:15 AM SLOT PER DR. PEMBERTON--FOR FOLLOW-UP AND SCHEDULING OF CARDIOVERSION AT THAT TIME--SCHEDULING OK TO ADD TO THAT TIME SLOT ? ? ? ? ?Important Information About Sugar ? ? ? ? ? ? ?

## 2021-06-01 NOTE — Progress Notes (Signed)
? ? ?Date:  06/10/2021  ? ?ID:  ORMAN MATSUMURA, DOB [birth year], MRN [id number] ? ?PCP:  [name]., MD  ?Cardiologist:   [name] ?Electrophysiologist:  None  ? ?Evaluation Performed:  Follow-Up Visit ? ?Chief Complaint:  CAD/CABG ? ?History of Present Illness:   ? ?76 y.o. history of CABG 2013 SVG to D1, SVG to RCA and LIMA to LAD. Significant COPD followed by Dr [place] Bells Last myovue done 10/30/17 EF 65% diaphragmatic attenuation with no ischemia low risk study ? ?Not smoking On oxygen Diagnosed with RUL adenocarcinoma and has had XRT Rx seeing [name] Activity limited by back pain Has seen neurology and not thought to be a surgical candidate CT done 09/21/20 post partial RUL lung resection with evolving post Rx changes stable ? ?Activity severely limited by back pain Does not want to take oxycodone ?No angina Needs new nitro  ? ?Seen by Dr Johney Frame for new onset PAF 05/27/21 Started on eliquis and cardizem  TTE ordered and reviewed 06/07/21 EF 40-45% LA 30 mm no significant valve disease Cardizem changed to lopressor given low EF  ? ?He is still in rate controlled flutter. Discussed [place]. He does not want amiodarone as it " killed his brothers lungs"  He is not an ideal ablation candidate Discussed being admitted for Tikosyn if [facility] does not work. Risks including stroke, PPM, intubation discussed willing to proceed Discussed with his daughter [name] as well  ? ? ?Past Medical History:  ?Diagnosis Date  ? CAD (coronary artery disease)   ? Remote PCI; s/p CABG x 3 in Feb 2013  ? COPD (chronic obstructive pulmonary disease) ([place])   ? Diabetes mellitus   ? GERD (gastroesophageal reflux disease)   ? Heart murmur   ? History of radiation therapy 03/19/2019  ? IMRT right lung 03/12/2019-03/19/2019  Dr [name]  ? Hyperlipidemia   ? Hypothyroidism   ? Lung cancer ([place]) 07/08/2000  ? status post right lower lobectomy for adenocarcinoma  ? Obesity   ? Pneumonia   ? ?Past Surgical History:  ?Procedure Laterality Date  ? CORONARY  ANGIOPLASTY  1990's  ? CORONARY ARTERY BYPASS GRAFT  03/18/2011  ? Procedure: CORONARY ARTERY BYPASS GRAFTING (CABG);  Surgeon: [name] [name], MD;  Location: [place];  Service: Open Heart Surgery;  Laterality: N/A;  ? INGUINAL HERNIA REPAIR  1960's  ? "? side"  ? LUNG LOBECTOMY    ? right, lower; "for lung cancer"  ? TONSILLECTOMY AND ADENOIDECTOMY  1950's  ?  ? ?No outpatient medications have been marked as taking for the 06/10/21 encounter (Office Visit) with Josue Hector, MD.  ?  ? ?Allergies:   Rosuvastatin calcium, Statins, Sulfasalazine, and Sulfonamide derivatives  ? ?Social History  ? ?Tobacco Use  ? Smoking status: Former  ?  Packs/day: 1.00  ?  Years: 50.00  ?  Pack years: 50.00  ?  Types: Cigarettes  ?  Quit date: 02/10/2018  ?  Years since quitting: 3.3  ? Smokeless tobacco: Never  ?Substance Use Topics  ? Alcohol use: No  ?  Alcohol/week: 0.0 standard drinks  ? Drug use: No  ?  ? ?Family Hx: ?The patient's family history includes Emphysema in an other family member; Lung cancer in an other family member. ? ?ROS:   ?Please see the history of present illness.    ? ?All other systems reviewed and are negative. ? ? ?Prior CV studies:   ?The following studies were reviewed today: ? ?Myovue  10/30/17 ? ?Labs/Other Tests and Data Reviewed:   ? ?EKG:  01/05/17 SR RBBB LPFB 01/28/19 ST rate 100 RBBB  06/10/2021 SR rBBB no acute changes 06/10/2021 SR rate 71 RBBB  ? ?Recent Labs: ?No results found for requested labs within last 8760 hours.  ? ?Recent Lipid Panel ?Lab Results  ?Component Value Date/Time  ? CHOL 201 (H) 03/27/2019 01:29 PM  ? TRIG 287 (H) 03/27/2019 01:29 PM  ? HDL 60 03/27/2019 01:29 PM  ? CHOLHDL 3.4 03/27/2019 01:29 PM  ? CHOLHDL 3.4 08/07/2015 08:51 AM  ? LDLCALC 93 03/27/2019 01:29 PM  ? LDLDIRECT 175 (H) 12/24/2018 12:11 PM  ? LDLDIRECT 72.0 03/31/2014 08:50 AM  ? ? ?Wt Readings from Last 3 Encounters:  ?06/10/21 208 lb (94.3 kg)  ?05/27/21 211 lb 12.8 oz (96.1 kg)  ?04/05/21 212 lb 4 oz (96.3 kg)   ?  ? ?Objective:   ? ?Vital Signs:  BP 112/70   Pulse 68   Ht 5\' 4"  (1.626 m)   Wt 208 lb (94.3 kg)   SpO2 97%   BMI 35.70 kg/m?   ? ?Affect appropriate ?Chronically ill COPD ?HEENT: normal ?Neck supple with no adenopathy ?JVP normal no bruits no thyromegaly ?Lung Decreased BS RUL  ?Heart:  S1/S2 no murmur, no rub, gallop or click ?PMI normal ?Abdomen: benighn, BS positve, no tenderness, no AAA ?no bruit.  No HSM or HJR ?Distal pulses intact with no bruits ?No edema ?Neuro non-focal ?Skin warm and dry ?No muscular weakness ? ? ? ?ASSESSMENT & PLAN:   ? ?CAD: CABG 2013 Non ischemic myovue 10/30/17 EF 65% no RWMA;s continue medical Rx  ?  ?HLD:  back on Praluent with grant assistance continue Zetia improved  ?  ?DM: Discussed low carb diet.  Target hemoglobin A1c is 6.5 or less.  Continue current medications. ?  ?Lung Cancer: post RUL resection and XRT for RUL tumor f/u CT August 2022  stable f/u with Dr Valeta [name] and [name] Presumptive stage 1 bronchogenic neoplasm  ?  ?Thyroid   On synthroid replacement labs with primary  ?  ?RBBB  Likely related to lung disease f/u ECG yearly  ? ?PAF:  Noted in office 05/27/21 Started on eliquis then with D/C ASA and NSAI's. Rate control fine changed to lopressor given lower EF on TTE [facility] set up with Dr Stanford Breed on 5/18 Labs same day at endo He has not missed any dose of eliquis If it is unsuccessful admit for Tikosyn and EP consult same day  ? ? ? ?Medication Adjustments/Labs and Tests Ordered: ?Current medicines are reviewed at length with the patient today.  Concerns regarding medicines are outlined above.  ? ?Tests Ordered: ? ?CBC/BMET ? ?Medication Changes: ? ?None  ? ?Disposition:  Follow up post [facility]  ? ?Time :  spent on visit review HP's notes, echo review patient interview, phone call to daughter ?Composing note , arranging [facility] And orders 60 minutes  ? ?Signed, ?Jenkins Rouge, MD  ?06/10/2021 11:03 AM    ?[place] ?

## 2021-06-07 ENCOUNTER — Ambulatory Visit (HOSPITAL_COMMUNITY): Payer: Medicare Other | Attending: Internal Medicine

## 2021-06-07 DIAGNOSIS — I4892 Unspecified atrial flutter: Secondary | ICD-10-CM

## 2021-06-07 LAB — ECHOCARDIOGRAM COMPLETE
Area-P 1/2: 5.88 cm2
S' Lateral: 2.2 cm

## 2021-06-07 MED ORDER — PERFLUTREN LIPID MICROSPHERE
1.0000 mL | INTRAVENOUS | Status: AC | PRN
  Administered 2021-06-07: 2 mL via INTRAVENOUS

## 2021-06-08 ENCOUNTER — Telehealth: Payer: Self-pay | Admitting: Cardiology

## 2021-06-08 ENCOUNTER — Telehealth: Payer: Self-pay | Admitting: *Deleted

## 2021-06-08 MED ORDER — METOPROLOL TARTRATE 25 MG PO TABS: 25.0000 mg | ORAL_TABLET | Freq: Two times a day (BID) | ORAL | 2 refills | Status: DC

## 2021-06-08 NOTE — Telephone Encounter (Signed)
The patient has been notified of the result and verbalized understanding.  All questions (if any) were answered. ? ?Pt aware to stop taking diltiazem and start taking metoprolol tartrate 25 mg po bid.  ?Pt will see Dr. [name] ([facility]) on 5/4, to reassess new onset aflutter and arrange for DCCV at that time, if appropriate.  Pt saw Dr. Johney Frame for DOD on 4/20, where he was started on eliquis for aflutter.  ? ?Confirmed the pharmacy of choice with the pt. ?Pt verbalized understanding and agrees with this plan. ?

## 2021-06-08 NOTE — Telephone Encounter (Signed)
Spoke with the pt.  He is aware when to take his metoprolol and he should take second dose around 8 pm tonight with his second dose of eliquis.  ?He is aware to continue lisinopril.  He will see Dr. [name] on 5/4. ?Pt verbalized understanding and agrees with this plan. ?

## 2021-06-08 NOTE — Telephone Encounter (Signed)
-----   Message from Freada Bergeron, MD sent at 06/08/2021  9:34 AM EDT ----- ?His pumping function has dropped to 40-45% likely due to his underlying atrial flutter. Given that his pumping function is lower, can we stop his diltiazem and change him to metoprolol tartrate 25mg  BID? Do you know when his DCCV is scheduled?  ?

## 2021-06-08 NOTE — Telephone Encounter (Signed)
Pt c/o medication issue: ? ?1. Name of Medication:  ? metoprolol tartrate (LOPRESSOR) 25 MG tablet  ? ? ?2. How are you currently taking this medication (dosage and times per day)? Take 1 tablet (25 mg total) by mouth 2 (two) times daily ? ?3. Are you having a reaction (difficulty breathing--STAT)? No ? ?4. What is your medication issue? Pt states that he just took his first dose of medication at 1:45 pm. He wants to know should he go ahead and take the second dose or wait until tomorrow ? ? ? ?Pt also like to know if he is suppose to still be taking his Lisinopril as well. Please advise ? ?

## 2021-06-10 ENCOUNTER — Ambulatory Visit: Payer: Medicare Other | Admitting: Cardiovascular Disease

## 2021-06-10 ENCOUNTER — Encounter: Payer: Self-pay | Admitting: Cardiovascular Disease

## 2021-06-10 VITALS — BP 112/70 | HR 68 | Ht 64.0 in | Wt 208.0 lb

## 2021-06-10 DIAGNOSIS — I4892 Unspecified atrial flutter: Secondary | ICD-10-CM

## 2021-06-10 NOTE — Patient Instructions (Addendum)
Medication Instructions:  ?Your physician recommends that you continue on your current medications as directed. Please refer to the Current Medication list given to you today. ? ?*If you need a refill on your cardiac medications before your next appointment, please call your pharmacy* ? ?Lab Work: ?If you have labs (blood work) drawn today and your tests are completely normal, you will receive your results only by: ?MyChart Message (if you have MyChart) OR ?A paper copy in the mail ?If you have any lab test that is abnormal or we need to change your treatment, we will call you to review the results. ? ?Testing/Procedures: ?Your physician has recommended that you have a Cardioversion (DCCV). Electrical Cardioversion uses a jolt of electricity to your heart either through paddles or wired patches attached to your chest. This is a controlled, usually prescheduled, procedure. Defibrillation is done under light anesthesia in the hospital, and you usually go home the day of the procedure. This is done to get your heart back into a normal rhythm. You are not awake for the procedure. Please see the instruction sheet given to you today. ? ?Follow-Up: ?At [facility], you and your health needs are our priority.  As part of our continuing mission to provide you with exceptional heart care, we have created designated Provider Care Teams.  These Care Teams include your primary Cardiologist (physician) and Advanced Practice Providers (APPs -  Physician Assistants and Nurse Practitioners) who all work together to provide you with the care you need, when you need it. ? ?We recommend signing up for the patient portal called "MyChart".  Sign up information is provided on this After Visit Summary.  MyChart is used to connect with patients for Virtual Visits (Telemedicine).  Patients are able to view lab/test results, encounter notes, upcoming appointments, etc.  Non-urgent messages can be sent to your provider as well.   ?To learn  more about what you can do with MyChart, go to [email or web address].   ? ?Your next appointment:   ?1 month(s) ? ?The format for your next appointment:   ?In Person ? ?Provider:   ?Jenkins Rouge, MD { ? ?Other Instructions ? ?You are scheduled for a Cardioversion on 06/24/21 with Dr. Stanford Breed.  Please arrive at the [facility] (Main Entrance A) at [facility]: [address] [place],  [address] at 8:00 am. ? ?DIET: Nothing to eat or drink after midnight except a sip of water with medications (see medication instructions below) ? ?FYI: For your safety, and to allow [place] to monitor your vital signs accurately during the surgery/procedure we request that   ?if you have artificial nails, gel coating, SNS etc. Please have those removed prior to your surgery/procedure. Not having the nail coverings /polish removed may result in cancellation or delay of your surgery/procedure. ? ? ?Medication Instructions: ?Hold Furosemide ? ?Continue your anticoagulant: Eliquis ?You will need to continue your anticoagulant after your procedure until you  are told by your  ?Provider that it is safe to stop ? ? ?Labs: Do on day of procedure ? ?You must have a responsible person to drive you home and stay in the waiting area during your procedure. Failure to do so could result in cancellation. ? ?Interior and spatial designer cards. ? ?*Special Note: Every effort is made to have your procedure done on time. Occasionally there are emergencies that occur at the hospital that may cause delays. Please be patient if a delay does occur.   ? ?Important Information About Sugar ? ? ? ? ?  ?

## 2021-06-17 ENCOUNTER — Encounter (HOSPITAL_COMMUNITY): Payer: Self-pay | Admitting: Cardiology

## 2021-06-20 ENCOUNTER — Other Ambulatory Visit: Payer: Self-pay | Admitting: Cardiovascular Disease

## 2021-06-20 DIAGNOSIS — I483 Typical atrial flutter: Secondary | ICD-10-CM

## 2021-06-24 ENCOUNTER — Ambulatory Visit (HOSPITAL_BASED_OUTPATIENT_CLINIC_OR_DEPARTMENT_OTHER): Payer: Medicare Other | Admitting: Anesthesiology

## 2021-06-24 ENCOUNTER — Other Ambulatory Visit: Payer: Self-pay

## 2021-06-24 ENCOUNTER — Ambulatory Visit (HOSPITAL_COMMUNITY)
Admission: RE | Admit: 2021-06-24 | Discharge: 2021-06-24 | Disposition: A | Discharge status: Home / Self Care | Payer: Medicare Other | Attending: Cardiology | Admitting: Cardiology

## 2021-06-24 ENCOUNTER — Ambulatory Visit (HOSPITAL_COMMUNITY): Payer: Medicare Other | Admitting: Anesthesiology

## 2021-06-24 ENCOUNTER — Encounter (HOSPITAL_COMMUNITY): Payer: Self-pay | Admitting: Cardiology

## 2021-06-24 ENCOUNTER — Encounter (HOSPITAL_COMMUNITY)
Admission: RE | Disposition: A | Discharge status: Home / Self Care | Payer: Self-pay | Source: Home / Self Care | Attending: Cardiology

## 2021-06-24 DIAGNOSIS — I483 Typical atrial flutter: Secondary | ICD-10-CM

## 2021-06-24 DIAGNOSIS — J449 Chronic obstructive pulmonary disease, unspecified: Secondary | ICD-10-CM | POA: Insufficient documentation

## 2021-06-24 DIAGNOSIS — I4510 Unspecified right bundle-branch block: Secondary | ICD-10-CM | POA: Diagnosis not present

## 2021-06-24 DIAGNOSIS — I4891 Unspecified atrial fibrillation: Secondary | ICD-10-CM | POA: Insufficient documentation

## 2021-06-24 DIAGNOSIS — Z79890 Hormone replacement therapy (postmenopausal): Secondary | ICD-10-CM | POA: Diagnosis not present

## 2021-06-24 DIAGNOSIS — I4892 Unspecified atrial flutter: Secondary | ICD-10-CM | POA: Diagnosis present

## 2021-06-24 DIAGNOSIS — Z923 Personal history of irradiation: Secondary | ICD-10-CM | POA: Diagnosis not present

## 2021-06-24 DIAGNOSIS — Z9981 Dependence on supplemental oxygen: Secondary | ICD-10-CM | POA: Insufficient documentation

## 2021-06-24 DIAGNOSIS — E785 Hyperlipidemia, unspecified: Secondary | ICD-10-CM | POA: Insufficient documentation

## 2021-06-24 DIAGNOSIS — Z85118 Personal history of other malignant neoplasm of bronchus and lung: Secondary | ICD-10-CM | POA: Diagnosis not present

## 2021-06-24 DIAGNOSIS — E039 Hypothyroidism, unspecified: Secondary | ICD-10-CM | POA: Insufficient documentation

## 2021-06-24 DIAGNOSIS — I2510 Atherosclerotic heart disease of native coronary artery without angina pectoris: Secondary | ICD-10-CM | POA: Diagnosis not present

## 2021-06-24 DIAGNOSIS — Z951 Presence of aortocoronary bypass graft: Secondary | ICD-10-CM | POA: Diagnosis not present

## 2021-06-24 DIAGNOSIS — Z79899 Other long term (current) drug therapy: Secondary | ICD-10-CM | POA: Insufficient documentation

## 2021-06-24 DIAGNOSIS — E119 Type 2 diabetes mellitus without complications: Secondary | ICD-10-CM | POA: Diagnosis not present

## 2021-06-24 DIAGNOSIS — Z7901 Long term (current) use of anticoagulants: Secondary | ICD-10-CM | POA: Insufficient documentation

## 2021-06-24 DIAGNOSIS — Z902 Acquired absence of lung [part of]: Secondary | ICD-10-CM | POA: Insufficient documentation

## 2021-06-24 HISTORY — PX: CARDIOVERSION: SHX1299

## 2021-06-24 LAB — POCT I-STAT, CHEM 8
BUN: 16 mg/dL (ref 8–23)
Calcium, Ion: 1.19 mmol/L (ref 1.15–1.40)
Chloride: 97 mmol/L — ABNORMAL LOW (ref 98–111)
Creatinine, Ser: 1.2 mg/dL (ref 0.61–1.24)
Glucose, Bld: 173 mg/dL — ABNORMAL HIGH (ref 70–99)
HCT: 41 % (ref 39.0–52.0)
Hemoglobin: 13.9 g/dL (ref 13.0–17.0)
Potassium: 3.9 mmol/L (ref 3.5–5.1)
Sodium: 137 mmol/L (ref 135–145)
TCO2: 29 mmol/L (ref 22–32)

## 2021-06-24 SURGERY — CARDIOVERSION
Anesthesia: General

## 2021-06-24 MED ORDER — PROPOFOL 10 MG/ML IV BOLUS
INTRAVENOUS | Status: DC | PRN
  Administered 2021-06-24: 50 mg via INTRAVENOUS

## 2021-06-24 MED ORDER — SODIUM CHLORIDE 0.9 % IV SOLN: INTRAVENOUS | Status: DC

## 2021-06-24 MED ORDER — LIDOCAINE 2% (20 MG/ML) 5 ML SYRINGE
INTRAMUSCULAR | Status: DC | PRN
  Administered 2021-06-24: 40 mg via INTRAVENOUS

## 2021-06-24 NOTE — Anesthesia Preprocedure Evaluation (Signed)
Anesthesia Evaluation  Patient identified by MRN, date of birth, ID band Patient awake    Reviewed: Allergy & Precautions, NPO status , Patient's Chart, lab work & pertinent test results, reviewed documented beta blocker date and time   Airway Mallampati: I       Dental  (+) Upper Dentures, Lower Dentures   Pulmonary former smoker,    Pulmonary exam normal        Cardiovascular hypertension, Pt. on medications and Pt. on home beta blockers + CAD and + CABG  Normal cardiovascular exam     Neuro/Psych    GI/Hepatic   Endo/Other  diabetes, Type 2, Oral Hypoglycemic AgentsHypothyroidism   Renal/GU      Musculoskeletal   Abdominal (+) + obese,   Peds  Hematology   Anesthesia Other Findings   Reproductive/Obstetrics                             Anesthesia Physical Anesthesia Plan  ASA: 2  Anesthesia Plan: General   Post-op Pain Management:    Induction: Intravenous  PONV Risk Score and Plan: 2 and Propofol infusion and TIVA  Airway Management Planned: Natural Airway and Simple Face Mask  Additional Equipment: None  Intra-op Plan:   Post-operative Plan:   Informed Consent: I have reviewed the patients History and Physical, chart, labs and discussed the procedure including the risks, benefits and alternatives for the proposed anesthesia with the patient or authorized representative who has indicated his/her understanding and acceptance.       Plan Discussed with: CRNA  Anesthesia Plan Comments:         Anesthesia Quick Evaluation

## 2021-06-24 NOTE — Anesthesia Procedure Notes (Signed)
Procedure Name: General with mask airway Date/Time: 06/24/2021 9:35 AM Performed by: Lorie Phenix, CRNA Pre-anesthesia Checklist: Patient identified, Emergency Drugs available, Suction available and Patient being monitored Patient Re-evaluated:Patient Re-evaluated prior to induction Oxygen Delivery Method: Nasal cannula Placement Confirmation: positive ETCO2

## 2021-06-24 NOTE — H&P (Signed)
Office Visit 06/10/2021 William D[facility] William Dyer, William Bamberg, Dyer Cardiology Atrial flutter, unspecified type [facility]) Dx Referred by William Dyer., Dyer Reason for Visit   Additional Documentation  Vitals:  BP 112/70 Pulse 68 Ht 5\' 4"  (1.626 m) Wt 94.3 kg SpO2 97% BMI 35.70 kg/m BSA 2.06 m  Flowsheets:  NEWS, MEWS Score, Anthropometrics   Encounter Info:  Billing Info, History, Allergies, Detailed Report    All Notes    Progress Notes by William Hector, Dyer at 06/10/2021 11:15 AM  Author: Josue Hector, Dyer Author Type: Physician Filed: 06/10/2021 11:48 AM  Note Status: Signed Cosign: Cosign Not Required Encounter Date: 06/10/2021  Editor: William Hector, Dyer (Physician)                     Date:  06/10/2021    ID:  William Dyer, DOB [birth year], MRN [id number]   PCP:  William Dyer., Dyer       Cardiologist:   William Dyer Electrophysiologist:  None    Evaluation Performed:  Follow-Up Visit   Chief Complaint:  CAD/CABG   History of Present Illness:     76 y.o. history of CABG 2013 SVG to D1, SVG to RCA and LIMA to LAD. 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 Discussed William Dyer. He does not want amiodarone as it " killed his brothers lungs"  He is not an ideal ablation  candidate Discussed being admitted for Tikosyn if [facility] does not work. Risks including stroke, PPM, intubation discussed willing to proceed Discussed with his daughter William Dyer as well          Past Medical History:  Diagnosis Date   CAD (coronary artery disease)      Remote PCI; s/p CABG x 3 in Feb 2013   COPD (chronic obstructive pulmonary disease) ([place])     Diabetes mellitus     GERD (gastroesophageal reflux disease)     Heart murmur     History of radiation therapy 03/19/2019    IMRT right lung 03/12/2019-03/19/2019  William Dyer   Hyperlipidemia     Hypothyroidism     Lung cancer ([place]) 07/08/2000    status post right lower lobectomy for adenocarcinoma   Obesity     Pneumonia           Past Surgical History:  Procedure Laterality Date   CORONARY ANGIOPLASTY   1990's   CORONARY ARTERY BYPASS GRAFT   03/18/2011    Procedure: CORONARY ARTERY BYPASS GRAFTING (CABG);  Surgeon: William Dyer;  Location: [facility];  Service: Open Heart Surgery;  Laterality: N/A;   INGUINAL HERNIA REPAIR   1960's    "? side"   LUNG LOBECTOMY  right, lower; "for lung cancer"   TONSILLECTOMY AND ADENOIDECTOMY   1950's      No outpatient medications have been marked as taking for the 06/10/21 encounter (Office Visit) with William Hector, Dyer.      Allergies:   Rosuvastatin calcium, Statins, Sulfasalazine, and Sulfonamide derivatives    Social History         Tobacco Use   Smoking status: Former      Packs/day: 1.00      Years: 50.00      Pack years: 50.00      Types: Cigarettes      Quit date: 02/10/2018      Years since quitting: 3.3   Smokeless tobacco: Never  Substance Use Topics   Alcohol use: No      Alcohol/week: 0.0 standard drinks   Drug use: No      Family Hx: The patient's family history includes Emphysema in an other family member; Lung cancer in an other family member.   ROS:   Please see the history of present illness.      All other systems reviewed and are  negative.     Prior CV studies:   The following studies were reviewed today:   Myovue 10/30/17   Labs/Other Tests and Data Reviewed:     EKG:  01/05/17 SR RBBB LPFB 01/28/19 ST rate 100 RBBB  06/10/2021 SR rBBB no acute changes 06/10/2021 SR rate 71 RBBB    Recent Labs: No results found for requested labs within last 8760 hours.    Recent Lipid Panel      Lab Results  Component Value Date/Time    CHOL 201 (H) 03/27/2019 01:29 PM    TRIG 287 (H) 03/27/2019 01:29 PM    HDL 60 03/27/2019 01:29 PM    CHOLHDL 3.4 03/27/2019 01:29 PM    CHOLHDL 3.4 08/07/2015 08:51 AM    LDLCALC 93 03/27/2019 01:29 PM    LDLDIRECT 175 (H) 12/24/2018 12:11 PM    LDLDIRECT 72.0 03/31/2014 08:50 AM         Wt Readings from Last 3 Encounters:  06/10/21 208 lb (94.3 kg)  05/27/21 211 lb 12.8 oz (96.1 kg)  04/05/21 212 lb 4 oz (96.3 kg)      Objective:     Vital Signs:  BP 112/70   Pulse 68   Ht 5\' 4"  (1.626 m)   Wt 208 lb (94.3 kg)   SpO2 97%   BMI 35.70 kg/m     Affect appropriate Chronically ill COPD HEENT: normal Neck supple with no adenopathy JVP normal no bruits no thyromegaly Lung Decreased BS RUL  Heart:  S1/S2 no murmur, no rub, gallop or click PMI normal Abdomen: benighn, BS positve, no tenderness, no AAA no bruit.  No HSM or HJR Distal pulses intact with no bruits No edema Neuro non-focal Skin warm and dry No muscular weakness       ASSESSMENT & PLAN:     CAD: CABG 2013 Non ischemic myovue 10/30/17 EF 65% no RWMA;s continue medical Rx    HLD:  back on Praluent with grant assistance continue Zetia improved    DM: Discussed low carb diet.  Target hemoglobin A1c is 6.5 or less.  Continue current medications.   Lung Cancer: post RUL resection and XRT for RUL tumor f/u CT August 2022  stable f/u with William William Dyer and William Dyer Presumptive stage 1 bronchogenic neoplasm    Thyroid   On synthroid replacement labs with primary  RBBB  Likely related to lung disease f/u ECG yearly     PAF:  Noted in office 05/27/21 Started on eliquis then with D/C ASA and NSAI's. Rate control fine changed to lopressor given lower EF on TTE [facility] set up with William William Dyer on 5/18 Labs same day at endo He has not missed any dose of eliquis If it is unsuccessful admit for Tikosyn and EP consult same day        Medication Adjustments/Labs and Tests Ordered: Current medicines are reviewed at length with the patient today.  Concerns regarding medicines are outlined above.    Tests Ordered:   CBC/BMET   Medication Changes:   None    Disposition:  Follow up post [facility]    Time :  spent on visit review HP's notes, echo review patient interview, phone call to daughter Composing note , arranging [facility] And orders 60 minutes    Signed, Jenkins Rouge, Dyer  06/10/2021 11:03 AM    [place]; no changes; pt compliant with apixaban. [name]

## 2021-06-24 NOTE — Procedures (Signed)
Electrical Cardioversion Procedure Note William Dyer [id number] May 23, 1945  Procedure: Electrical Cardioversion Indications:  Atrial flutter  Procedure Details Consent: Risks of procedure as well as the alternatives and risks of each were explained to the (patient/caregiver).  Consent for procedure obtained. Time Out: Verified patient identification, verified procedure, site/side was marked, verified correct patient position, special equipment/implants available, medications/allergies/relevent history reviewed, required imaging and test results available.  Performed  Patient placed on cardiac monitor, pulse oximetry, supplemental oxygen as necessary.  Sedation given:  Pt sedated by anesthesia with lidocaine 40 mg and diprovan 50 mg IV. Pacer pads placed anterior and posterior chest.  Cardioverted 1 time(s).  Cardioverted at [place].  Evaluation Findings: Post procedure EKG shows: NSR Complications: None Patient did tolerate procedure well.   William Dyer 06/24/2021, 7:58 AM

## 2021-06-24 NOTE — Interval H&P Note (Signed)
History and Physical Interval Note:  06/24/2021 8:03 AM  William Dyer  has presented today for surgery, with the diagnosis of ATRIAL FIBRILLATION.  The various methods of treatment have been discussed with the patient and family. After consideration of risks, benefits and other options for treatment, the patient has consented to  Procedure(s): CARDIOVERSION (N/A) as a surgical intervention.  The patient's history has been reviewed, patient examined, no change in status, stable for surgery.  I have reviewed the patient's chart and labs.  Questions were answered to the patient's satisfaction.     [name]

## 2021-06-24 NOTE — Transfer of Care (Signed)
Immediate Anesthesia Transfer of Care Note  Patient: William Dyer  Procedure(s) Performed: CARDIOVERSION  Patient Location: Endoscopy Unit  Anesthesia Type:General  Level of Consciousness: awake  Airway & Oxygen Therapy: Patient Spontanous Breathing  Post-op Assessment: Report given to RN and Post -op Vital signs reviewed and stable  Post vital signs: Reviewed and stable  Last Vitals:  Vitals Value Taken Time  BP 118/89   Temp    Pulse 74   Resp 26   SpO2 92     Last Pain:  Vitals:   06/24/21 0840  TempSrc: Temporal  PainSc: 0-No pain         Complications: No notable events documented.

## 2021-06-24 NOTE — Anesthesia Postprocedure Evaluation (Signed)
Anesthesia Post Note  Patient: William Dyer  Procedure(s) Performed: CARDIOVERSION     Patient location during evaluation: Endoscopy Anesthesia Type: General Level of consciousness: awake Pain management: pain level controlled Vital Signs Assessment: post-procedure vital signs reviewed and stable Respiratory status: spontaneous breathing Cardiovascular status: stable Postop Assessment: no apparent nausea or vomiting Anesthetic complications: no   No notable events documented.  Last Vitals:  Vitals:   06/24/21 1007 06/24/21 1017  BP: 127/68 113/65  Pulse: 73 75  Resp: 15 20  Temp:    SpO2: 98% 96%    Last Pain:  Vitals:   06/24/21 1017  TempSrc:   PainSc: 0-No pain                 Huston Foley

## 2021-06-24 NOTE — Discharge Instructions (Signed)

## 2021-06-25 ENCOUNTER — Encounter (HOSPITAL_COMMUNITY): Payer: Self-pay | Admitting: Cardiology

## 2021-07-19 ENCOUNTER — Telehealth: Payer: Self-pay | Admitting: Cardiovascular Disease

## 2021-07-19 NOTE — Telephone Encounter (Signed)
Spoke with pt in regards to CP.  Reports CP started after 06/24/21 DCCV.  Describes as a pain when asked if pain is sharp, dull or achy just says its pain. Pain is to right chest bone but sometimes goes to Left side.  Pain mainly occurs when pt is up and moving around.  Pt sits down to help pain subside.  Has not taken any NTG.  Educated pt on use of NTG and advised to use if needed.  Denies N/V reports left elbow pain.  Reports does have arthritis.  Pt took BP while on the phone 149/87-65.  Pt denies feeling funny heart beats.  Has not missed any doses of Eliquis or metoprolol. Has not taken furosemide in 2 days d/t increased urination.  He says will take med today.  Denies significant wt increase.  Pt OV rescheduled for Dr. [name] on 07/20/21 at 9:30 am.  Advised pt again to use NTG for CP and call 911 if needed before tomorrow's OV.  Pt expresses he will follow instructions.

## 2021-07-19 NOTE — Telephone Encounter (Signed)
Pt c/o of Chest Pain: STAT if CP now or developed within 24 hours  1. Are you having CP right now? Yes.    2. Are you experiencing any other symptoms (ex. SOB, nausea, vomiting, sweating)? No  3. How long have you been experiencing CP? Two weeks.  Since had electric shock  4. Is your CP continuous or coming and going? Comes and go  5. Have you taken Nitroglycerin? No   Transferring to Triage due to being STAT ?

## 2021-07-19 NOTE — Progress Notes (Signed)
Date:  07/19/2021   ID:  GARION WEMPE, DOB March 31, 1945, MRN [id number]  PCP:  [name]., MD  Cardiologist:   [name] Electrophysiologist:  None   Evaluation Performed:  Follow-Up Visit  Chief Complaint:  CAD/CABG  History of Present Illness:    76 y.o. history of CABG 2013 SVG to D1, SVG to RCA and LIMA to LAD. 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 Discussed [place]. 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 since procedure   Pain is clearly positional and pleuritic Doubt PE as he has been anticoagulated and may be from [facility] and pain in ribs/cartilage    Past Medical History:  Diagnosis Date   CAD (coronary artery disease)    Remote PCI; s/p CABG x 3 in Feb 2013   COPD (chronic obstructive pulmonary disease) ([facility])    Diabetes mellitus    GERD (gastroesophageal reflux disease)    Heart murmur    History of radiation therapy 03/19/2019   IMRT right lung 03/12/2019-03/19/2019  Dr [name]   Hyperlipidemia    Hypothyroidism    Lung cancer ([place]) 07/08/2000   status  post right lower lobectomy for adenocarcinoma   Obesity    Pneumonia    Past Surgical History:  Procedure Laterality Date   CARDIOVERSION N/A 06/24/2021   Procedure: CARDIOVERSION;  Surgeon: Lelon Perla, MD;  Location: [place];  Service: Cardiovascular;  Laterality: N/A;   CORONARY ANGIOPLASTY  1990's   CORONARY ARTERY BYPASS GRAFT  03/18/2011   Procedure: CORONARY ARTERY BYPASS GRAFTING (CABG);  Surgeon: [name] [name], MD;  Location: [place];  Service: Open Heart Surgery;  Laterality: N/A;   INGUINAL HERNIA REPAIR  1960's   "? side"   LUNG LOBECTOMY     right, lower; "for lung cancer"   TONSILLECTOMY AND ADENOIDECTOMY  1950's     No outpatient medications have been marked as taking for the 07/20/21 encounter (Appointment) with Josue Hector, MD.     Allergies:   Rosuvastatin calcium, Statins, Sulfasalazine, and Sulfonamide derivatives   Social History   Tobacco Use   Smoking status: Former    Packs/day: 1.00    Years: 50.00    Total pack years: 50.00    Types: Cigarettes    Quit date: 02/10/2018    Years since quitting: 3.4   Smokeless tobacco: Never  Substance Use Topics   Alcohol use: No    Alcohol/week: 0.0 standard drinks of alcohol   Drug use: No     Family Hx: The  patient's family history includes Emphysema in an other family member; Lung cancer in an other family member.  ROS:   Please see the history of present illness.     All other systems reviewed and are negative.   Prior CV studies:   The following studies were reviewed today:  Myovue 10/30/17  Labs/Other Tests and Data Reviewed:    EKG:  01/05/17 SR RBBB LPFB 01/28/19 ST rate 100 RBBB  07/19/2021 SR rBBB no acute changes 07/19/2021 SR rate 71 RBBB   Recent Labs: 06/24/2021: BUN 16; Creatinine, Ser 1.20; Hemoglobin 13.9; Potassium 3.9; Sodium 137   Recent Lipid Panel Lab Results  Component Value Date/Time   CHOL 201 (H) 03/27/2019 01:29 PM   TRIG 287 (H) 03/27/2019 01:29 PM   HDL  60 03/27/2019 01:29 PM   CHOLHDL 3.4 03/27/2019 01:29 PM   CHOLHDL 3.4 08/07/2015 08:51 AM   LDLCALC 93 03/27/2019 01:29 PM   LDLDIRECT 175 (H) 12/24/2018 12:11 PM   LDLDIRECT 72.0 03/31/2014 08:50 AM    Wt Readings from Last 3 Encounters:  06/24/21 208 lb (94.3 kg)  06/10/21 208 lb (94.3 kg)  05/27/21 211 lb 12.8 oz (96.1 kg)     Objective:    Vital Signs:  There were no vitals taken for this visit.   Affect appropriate Chronically ill COPD HEENT: normal Neck supple with no adenopathy JVP normal no bruits no thyromegaly Lung Decreased BS RUL  Heart:  S1/S2 no murmur, no rub, gallop or click PMI normal Abdomen: benighn, BS positve, no tenderness, no AAA no bruit.  No HSM or HJR Distal pulses intact with no bruits No edema Neuro non-focal Skin warm and dry No muscular weakness    ASSESSMENT & PLAN:    CAD: CABG 2013 Non ischemic myovue 10/30/17 EF 65% no RWMA;s continue medical Rx Given atypical pain since [facility] observe consider f/u PET CT if continues. His pain is more muscular/pleuritic Will check non contrast CT to make sure he has not developed pneumo/blep that burst with his COPD and also assess chest skeletal and pleural lining 21 day prednisone taper    HLD:  back on Praluent with grant assistance continue Zetia improved    DM: Discussed low carb diet.  Target hemoglobin A1c is 6.5 or less.  Continue current medications.   Lung Cancer: post RUL resection and XRT for RUL tumor f/u CT August 2022  stable f/u with Dr Valeta [name] and [name] Presumptive stage 1 bronchogenic neoplasm CT scan 04/02/21 stable no new lesions Atypical pain post [facility] may be related to lung dx    Thyroid   On synthroid replacement labs with primary    RBBB  Likely related to lung disease f/u ECG yearly   PAF:  Noted in office 05/27/21 Started on eliquis then with D/C ASA and NSAI's. Post successful [facility] on 06/24/21 Discussed admission for Tikosyn if recurs     Medication Adjustments/Labs and Tests  Ordered: Current medicines are reviewed at length with the patient today.  Concerns regarding medicines are outlined above.   Tests Ordered:  Chest CT non contrast   Medication Changes:  21 day prednisone taper   Disposition:  Follow up 2-3 weeks PA/NP or me    Signed, Jenkins Rouge, MD  07/19/2021 2:12 PM    [facility]

## 2021-07-20 ENCOUNTER — Encounter: Payer: Self-pay | Admitting: Cardiovascular Disease

## 2021-07-20 ENCOUNTER — Ambulatory Visit: Payer: Medicare Other | Admitting: Cardiovascular Disease

## 2021-07-20 VITALS — BP 116/70 | HR 61 | Ht 64.0 in | Wt 214.0 lb

## 2021-07-20 DIAGNOSIS — J431 Panlobular emphysema: Secondary | ICD-10-CM

## 2021-07-20 DIAGNOSIS — R0781 Pleurodynia: Secondary | ICD-10-CM

## 2021-07-20 DIAGNOSIS — E782 Mixed hyperlipidemia: Secondary | ICD-10-CM

## 2021-07-20 DIAGNOSIS — Z951 Presence of aortocoronary bypass graft: Secondary | ICD-10-CM

## 2021-07-20 DIAGNOSIS — R072 Precordial pain: Secondary | ICD-10-CM

## 2021-07-20 DIAGNOSIS — I483 Typical atrial flutter: Secondary | ICD-10-CM | POA: Diagnosis not present

## 2021-07-20 MED ORDER — PREDNISONE 10 MG (21) PO TBPK: ORAL_TABLET | ORAL | 0 refills | Status: DC

## 2021-07-20 NOTE — Patient Instructions (Addendum)
Medication Instructions:  Your physician has recommended you make the following change in your medication:  TAKE Prednisone 6 tablets by mouth on day 1, then decrease one tablet each day from 5, 4, 3 ,2, 1.  *If you need a refill on your cardiac medications before your next appointment, please call your pharmacy*  Lab Work: If you have labs (blood work) drawn today and your tests are completely normal, you will receive your results only by: [place] (if you have MyChart) OR A paper copy in the mail If you have any lab test that is abnormal or we need to change your treatment, we will call you to review the results.  Testing/Procedures: Chest CT scanning, (CAT scanning), is a noninvasive, special x-ray that produces cross-sectional images of the body using x-rays and a computer. CT scans help physicians diagnose and treat medical conditions. For some CT exams, a contrast material is used to enhance visibility in the area of the body being studied. CT scans provide greater clarity and reveal more details than regular x-ray exams.  Follow-Up: At [facility], you and your health needs are our priority.  As part of our continuing mission to provide you with exceptional heart care, we have created designated Provider Care Teams.  These Care Teams include your primary Cardiologist (physician) and Advanced Practice Providers (APPs -  Physician Assistants and Nurse Practitioners) who all work together to provide you with the care you need, when you need it.  We recommend signing up for the patient portal called "MyChart".  Sign up information is provided on this After Visit Summary.  MyChart is used to connect with patients for Virtual Visits (Telemedicine).  Patients are able to view lab/test results, encounter notes, upcoming appointments, etc.  Non-urgent messages can be sent to your provider as well.   To learn more about what you can do with MyChart, go to [email or web address].    Your  next appointment:   3 week(s)  The format for your next appointment:   In Person  Provider:   Robbie Lis, PA-C, Nicholes Rough, PA-C, Melina Copa, PA-C, Ambrose Pancoast, NP, Cecilie Kicks, NP, Ermalinda Barrios, PA-C, Christen Bame, NP, Richardson Dopp, PA-C, or Margie Billet, NP     Then, Jenkins Rouge, MD will plan to see you again in 6 month(s).{    Important Information About Sugar

## 2021-07-22 ENCOUNTER — Ambulatory Visit: Payer: Medicare Other | Admitting: Cardiovascular Disease

## 2021-07-28 ENCOUNTER — Other Ambulatory Visit: Payer: Medicare Other

## 2021-07-28 ENCOUNTER — Ambulatory Visit (INDEPENDENT_AMBULATORY_CARE_PROVIDER_SITE_OTHER)
Admission: RE | Admit: 2021-07-28 | Discharge: 2021-07-28 | Disposition: A | Discharge status: Home / Self Care | Payer: Medicare Other | Source: Ambulatory Visit | Attending: Cardiovascular Disease | Admitting: Cardiovascular Disease

## 2021-07-28 DIAGNOSIS — R072 Precordial pain: Secondary | ICD-10-CM | POA: Diagnosis not present

## 2021-08-04 ENCOUNTER — Telehealth: Payer: Self-pay | Admitting: Cardiovascular Disease

## 2021-08-04 NOTE — Telephone Encounter (Signed)
Called patient with results of CT. While on the phone patient complained of chest pain. Patient stated his chest pain comes and goes, it starts in his right upper chest where it feels sore, then goes to the middle of chest, that feels dull at times  and  moves to the  left side of his chest. Pain usually does not last long. He stated it last for about 2 to 3 minutes. Patient stated his chest pain has been going on before his cardioversion and it  has gotten worse since the cardioversion. Patient stated he does not take nitro, because he doesn't think he needs it. Encouraged patient to use his nitro if pain is not going away, and if it gets worse to call 911 or go to the ED. Patient has follow up with APP next week. Will forward to Dr. [name] for advisement until then.

## 2021-08-04 NOTE — Telephone Encounter (Signed)
Patient is returning RN's call regarding his CT results.

## 2021-08-05 NOTE — Telephone Encounter (Signed)
Called patient to let him Dr. [name] is aware of his symptoms and to keep his appointment next week. Encouraged to take his nitro and call 911 if chest pain is not relieved. Clarified with patient that he did have some chest pain before cardioversion, but "just a little bit" per patient. Patient verbalized understanding.

## 2021-08-09 NOTE — Progress Notes (Unsigned)
Office Visit    Patient Name: William Dyer Date of Encounter: 08/09/2021  PCP:  [name]., MD   [facility]  Cardiologist:  Jenkins Rouge, MD  Advanced Practice Provider:  No care team member to display Electrophysiologist:  None    Chief Complaint    William Dyer is a 76 y.o. male with a hx of CABG 2013 SVG to D1, SVG to RCA and LIMA to LAD, COPD, RUL adenocarcinoma presents today for follow-up.   Last Myoview was done 10/30/17 EF 65% diaphragmatic attenuation with no ischemia low risk study.  He was not smoking and on oxygen with a history of right upper lobe adenocarcinoma.  He also has a diagnosis of back pain and was seen by neurology and not considered a surgical candidate.  CT was done 09/21/2020 post partial RUL lung resection with evolving post x-ray changes stable.   Activity level severely limited by back pain.  He does not want to take oxycodone.  He has not had any chest pain and has not needed any nitro.  He was seen by Dr. Johney Frame for new onset PAF 05/27/2021 and started on Eliquis and Cardizem.  TTE ordered and reviewed 06/07/2021 EF 40 to 45%, LA 30 mm no significant valve disease.  Cardizem changed to Lopressor given low EF.  On 06/10/2021 he was still in rate controlled atrial fibrillation.  Discussed DCCV.  He did not want any amiodarone since it "killed his brother's lungs".  He is not an ideal ablation candidate.  Discussed being admitted for Tikosyn if DCCV does not work.  06/24/2021 he had a successful DCCV with Dr. Stanford Breed.  He had had some atypical arthritic like SS CP left and right since procedure.  The pain was clearly positional and pleuritic.  Not thought to be a PE as he was anticoagulated.  May be from the [place] since the pain was located in his rib/cartilage.  Today, he has had continued chest pain.  He states that its transient and feels more like a dull ache.  He describes the pain to be on both the right and left side of his  chest.  The pain is transient and is not necessarily associated with any activity.  He had lung surgery on the right side by Dr. Prescott Gum several years ago and he feels like the right-sided pain might be due to some scar tissue from the surgery. Dr. [name] believed the pain to be Costochondritis and prescribed a steroid taper however, this has not seemed to help.  He is also concerned about his upper right quadrant pain.  On his CT scan it did show that he had gallstones and may need to have his gallbladder removed.  I suggested talking to his primary care provider about this.  He has not noticed that his heart has been beating fast like before.  He was able to notice that his heart rate was elevated because he uses a pulse oximeter at home.  It does not seem like he has gone back into atrial fibrillation since his cardioversion about a month ago.  He has a small amount of edema around his ankles left greater than right but this seems to be chronic and may be more due to arthritis.  No edema, orthopnea, PND. Reports no palpitations.    Past Medical History    Past Medical History:  Diagnosis Date   CAD (coronary artery disease)    Remote PCI; s/p CABG  x 3 in Feb 2013   COPD (chronic obstructive pulmonary disease) (HCC)    Diabetes mellitus    GERD (gastroesophageal reflux disease)    Heart murmur    History of radiation therapy 03/19/2019   IMRT right lung 03/12/2019-03/19/2019  Dr [name]   Hyperlipidemia    Hypothyroidism    Lung cancer ([place]) 07/08/2000   status post right lower lobectomy for adenocarcinoma   Obesity    Pneumonia    Past Surgical History:  Procedure Laterality Date   CARDIOVERSION N/A 06/24/2021   Procedure: CARDIOVERSION;  Surgeon: Lelon Perla, MD;  Location: [facility];  Service: Cardiovascular;  Laterality: N/A;   CORONARY ANGIOPLASTY  1990's   CORONARY ARTERY BYPASS GRAFT  03/18/2011   Procedure: CORONARY ARTERY BYPASS GRAFTING (CABG);  Surgeon: [name]  [name], MD;  Location: [facility];  Service: Open Heart Surgery;  Laterality: N/A;   INGUINAL HERNIA REPAIR  1960's   "? side"   LUNG LOBECTOMY     right, lower; "for lung cancer"   TONSILLECTOMY AND ADENOIDECTOMY  1950's    Allergies  Allergies  Allergen Reactions   Rosuvastatin Calcium     Failed lipitor and Zocor due to muscle aches   Statins     Failed lipitor and Zocor due to muscle aches   Sulfasalazine Other (See Comments)    Patient "draws up" muscularly   Sulfonamide Derivatives Other (See Comments)    Patient "drasws up" muscularly    EKGs/Labs/Other Studies Reviewed:   The following studies were reviewed today:  CT of the chest without contrast 07/28/2021  IMPRESSION: 1. Stable noncontrast chest CT, without evidence of acute findings. 2. Stable postoperative and post radiation changes in the right lung. 3. Coronary and aortic atherosclerosis (ICD10-I70.0). Emphysema (ICD10-J43.9). Previous CABG. 4. Probable cholelithiasis  Echocardiogram 06/07/2021  IMPRESSIONS     1. Left ventricular ejection fraction, by estimation, is 40 to 45%. The  left ventricle has mildly decreased function. The left ventricle  demonstrates global hypokinesis. There is mild concentric left ventricular  hypertrophy. Left ventricular diastolic  parameters are indeterminate.   2. Right ventricular systolic function was not well visualized. The right  ventricular size is normal. Tricuspid regurgitation signal is inadequate  for assessing PA pressure.   3. The mitral valve is normal in structure. No evidence of mitral valve  regurgitation. No evidence of mitral stenosis.   4. The aortic valve is tricuspid. There is mild calcification of the  aortic valve. There is moderate thickening of the aortic valve. Aortic  valve regurgitation is not visualized. Aortic valve sclerosis is present,  with no evidence of aortic valve  stenosis.   5. The inferior vena cava is normal in size with greater  than 50%  respiratory variability, suggesting right atrial pressure of 3 mmHg.   Comparison(s): LVEF has decreased since prior report (2013 study).   EKG:  EKG is not ordered today.    Recent Labs: 06/24/2021: BUN 16; Creatinine, Ser 1.20; Hemoglobin 13.9; Potassium 3.9; Sodium 137  Recent Lipid Panel    Component Value Date/Time   CHOL 201 (H) 03/27/2019 1329   TRIG 287 (H) 03/27/2019 1329   HDL 60 03/27/2019 1329   CHOLHDL 3.4 03/27/2019 1329   CHOLHDL 3.4 08/07/2015 0851   VLDL 37 (H) 08/07/2015 0851   LDLCALC 93 03/27/2019 1329   LDLDIRECT 175 (H) 12/24/2018 1211   LDLDIRECT 72.0 03/31/2014 0850    Risk Assessment/Calculations:   CHA2DS2-VASc Score = 5  This indicates a 7.2% annual risk of stroke. The patient's score is based upon: CHF History: 0 HTN History: 1 Diabetes History: 1 Stroke History: 0 Vascular Disease History: 1 Age Score: 2 Gender Score: 0     Home Medications   No outpatient medications have been marked as taking for the 08/11/21 encounter (Appointment) with Elgie Collard, PA-C.     Review of Systems      All other systems reviewed and are otherwise negative except as noted above.  Physical Exam    VS:  There were no vitals taken for this visit. , BMI There is no height or weight on file to calculate BMI.  Wt Readings from Last 3 Encounters:  07/20/21 214 lb (97.1 kg)  06/24/21 208 lb (94.3 kg)  06/10/21 208 lb (94.3 kg)     GEN: Well nourished, well developed, in no acute distress. HEENT: normal. Neck: Supple, no JVD, carotid bruits, or masses. Cardiac: RRR, no murmurs, rubs, or gallops. No clubbing, cyanosis, edema.  Radials/PT 2+ and equal bilaterally.  Respiratory:  Respirations regular and unlabored, clear to auscultation bilaterally. GI: Soft, nontender, nondistended. MS: No deformity or atrophy. Skin: Warm and dry, no rash. Neuro:  Strength and sensation are intact. Psych: Normal affect.  Assessment & Plan    CAD s/p CABG  2013 -CABG 2013 nonischemic Myoview 10/30/2017 with EF 65% no wall motion abnormalities -Pain was pleuritic/muscular.  Noncontrast CT scan showed:  Stable noncontrast chest CT, without evidence of acute findings. Stable postoperative and post radiation changes in the right lung. Coronary and aortic atherosclerosis (ICD10-I70.0). Emphysema Previous CABG.Probable cholelithiasis -21 day prednisone taper-pain thought to be muscle related but not helping -slight ache on the left side that is transient -We will plan to do a Lexiscan Myoview to further evaluate the origin of his chest pain  PAF -No recurrence since cardioversion about a month ago -Normal sinus rhythm today on exam  HLD -Due for an updated lipid panel next month through his PCP -Last triglycerides were from February 2021 at 287.  Patient states this is good for him since at one point they were over thousand -Last LDL was 93.  Goal less than 70 due to his CAD  DM -Last A1c 6.6 -Surveillance per PCP  Lung CA -Status post lung resection  Hypothyroidism -Continue Synthroid  RBBB -Thought to be due to lung disease f/u EKG yearly  8.  Cholelithiasis -We will need work-up done through PCP  Disposition: Follow up 4-6 weeks with Jenkins Rouge, MD or APP.  Signed, Elgie Collard, PA-C 08/09/2021, 1:11 PM [facility]

## 2021-08-11 ENCOUNTER — Ambulatory Visit: Payer: Medicare Other | Admitting: Physician Assistant

## 2021-08-11 ENCOUNTER — Encounter: Payer: Self-pay | Admitting: Physician Assistant

## 2021-08-11 ENCOUNTER — Encounter: Payer: Self-pay | Admitting: *Deleted

## 2021-08-11 VITALS — BP 132/78 | HR 72 | Ht 64.0 in | Wt 211.0 lb

## 2021-08-11 DIAGNOSIS — I4510 Unspecified right bundle-branch block: Secondary | ICD-10-CM

## 2021-08-11 DIAGNOSIS — I200 Unstable angina: Secondary | ICD-10-CM | POA: Diagnosis not present

## 2021-08-11 DIAGNOSIS — I10 Essential (primary) hypertension: Secondary | ICD-10-CM | POA: Diagnosis not present

## 2021-08-11 DIAGNOSIS — I4892 Unspecified atrial flutter: Secondary | ICD-10-CM

## 2021-08-11 DIAGNOSIS — I25810 Atherosclerosis of coronary artery bypass graft(s) without angina pectoris: Secondary | ICD-10-CM

## 2021-08-11 DIAGNOSIS — E785 Hyperlipidemia, unspecified: Secondary | ICD-10-CM | POA: Diagnosis not present

## 2021-08-11 DIAGNOSIS — Z951 Presence of aortocoronary bypass graft: Secondary | ICD-10-CM

## 2021-08-11 NOTE — Patient Instructions (Signed)
Medication Instructions:  Your physician recommends that you continue on your current medications as directed. Please refer to the Current Medication list given to you today.  *If you need a refill on your cardiac medications before your next appointment, please call your pharmacy*   Lab Work: Have your primary care provider check your lipids when you see them next month If you have labs (blood work) drawn today and your tests are completely normal, you will receive your results only by: [place] (if you have MyChart) OR A paper copy in the mail If you have any lab test that is abnormal or we need to change your treatment, we will call you to review the results.   Testing/Procedures: Your physician has requested that you have a lexiscan myoview. For further information please visit [email or web address]. Please follow instruction sheet, as given.    Follow-Up: At [facility], you and your health needs are our priority.  As part of our continuing mission to provide you with exceptional heart care, we have created designated Provider Care Teams.  These Care Teams include your primary Cardiologist (physician) and Advanced Practice Providers (APPs -  Physician Assistants and Nurse Practitioners) who all work together to provide you with the care you need, when you need it.  Your next appointment:   Follow up after testing  The format for your next appointment:   In Person  Provider:   Jenkins Rouge, MD  or Nicholes Rough, PA-C       Important Information About Sugar

## 2021-08-18 ENCOUNTER — Telehealth (HOSPITAL_COMMUNITY): Payer: Self-pay | Admitting: *Deleted

## 2021-08-18 NOTE — Telephone Encounter (Signed)
Patient given detailed instructions per Myocardial Perfusion Study Information Sheet for the test on  08/23/21. Patient notified to arrive 15 minutes early and that it is imperative to arrive on time for appointment to keep from having the test rescheduled.  If you need to cancel or reschedule your appointment, please call the office within 24 hours of your appointment. . Patient verbalized understanding. Kirstie Peri

## 2021-08-20 IMAGING — CT NM PET TUM IMG INITIAL (PI) SKULL BASE T - THIGH
7 series · 25 of 25 positions shown · non-contrast
Comparison: Low-dose lung cancer screening CT chest dated
10/30/2018

CLINICAL DATA: Initial treatment strategy for medial right upper
lobe nodule. History of lung cancer status post right lower lobe
wedge resection.

EXAM:
NUCLEAR MEDICINE PET SKULL BASE TO THIGH
TECHNIQUE: 10.5 mCi F-18 FDG was injected intravenously. Full-ring PET imaging
was performed from the skull base to thigh after the radiotracer. CT
data was obtained and used for attenuation correction and anatomic
localization.
Fasting blood glucose: 149 mg/dl

[Series 3: pet sk_thigh ac · axial · 5.0mm · 4.07mm/px · z∈[-886,-58]mm · 5 of 208 slices shown]
[im 1/208]
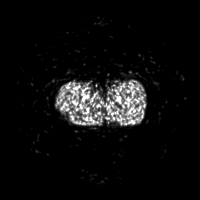
[im 52/208]
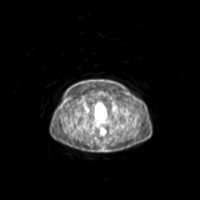
[im 104/208]
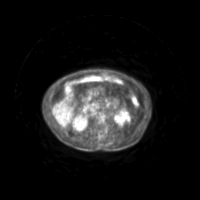
[im 156/208]
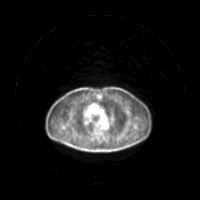
[im 208/208]
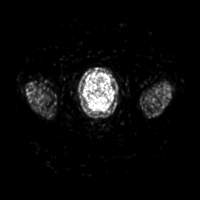

[Series 4: ct sk_thigh 5.0 hd_fov · axial · 5.0mm · 1.27mm/px · z∈[-886,-58]mm · 5 of 208 slices shown]
[im 1/208]
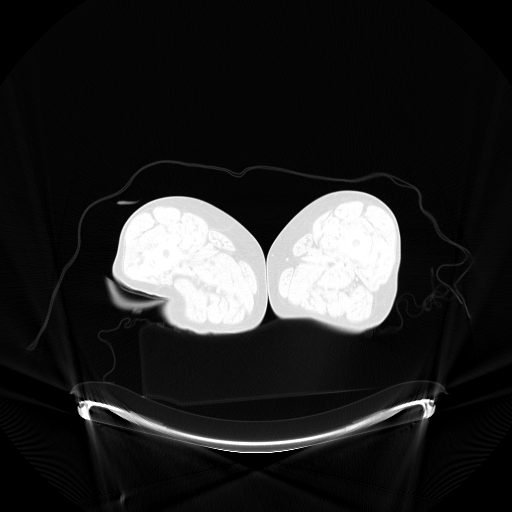
[im 52/208  brain]
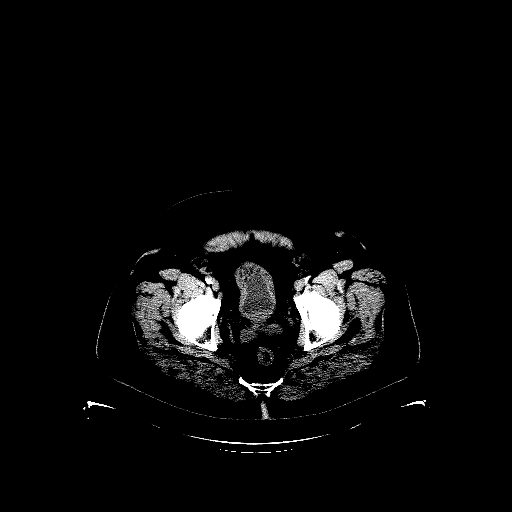
[im 104/208  brain]
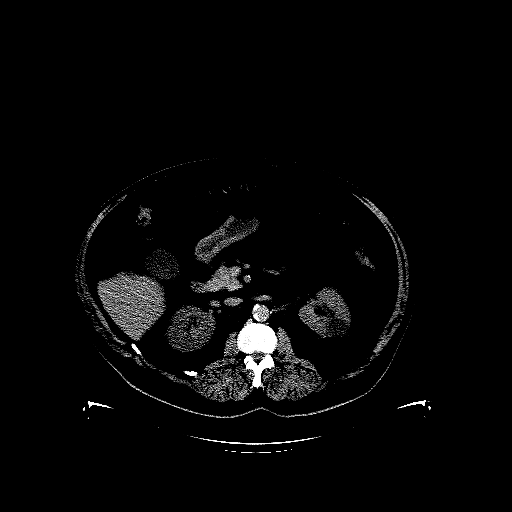
[im 156/208]
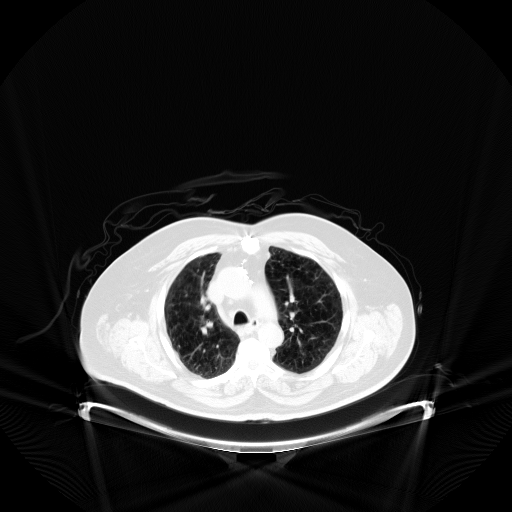
[im 208/208]
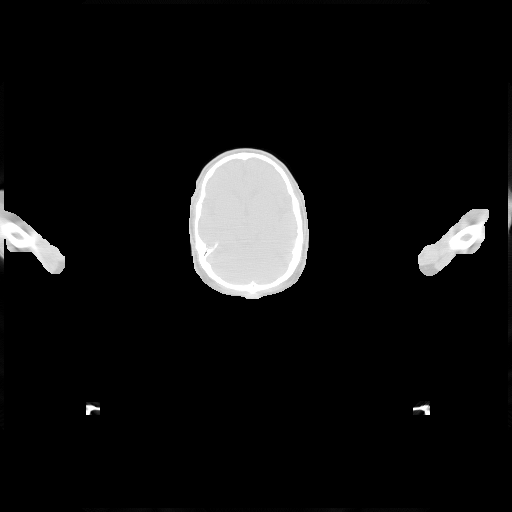

[Series 5: pet sk_thigh nac · axial · 5.0mm · 4.07mm/px · z∈[-886,-58]mm · 5 of 208 slices shown]
[im 1/208]
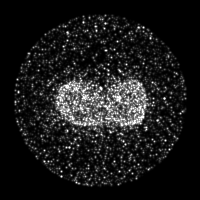
[im 52/208]
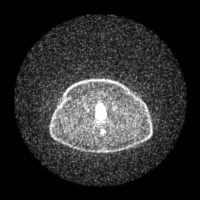
[im 104/208]
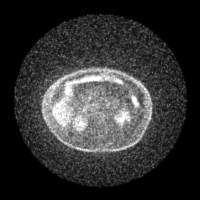
[im 156/208]
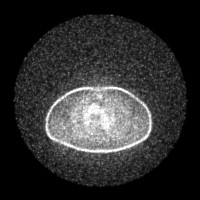
[im 208/208]
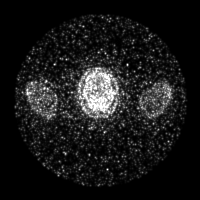

[Series 8: ct sk_thigh 5.0 (id) lung_bone · axial · 5.0mm · 0.78mm/px · 1 of 60 slices shown]
[im 1/60  bone]
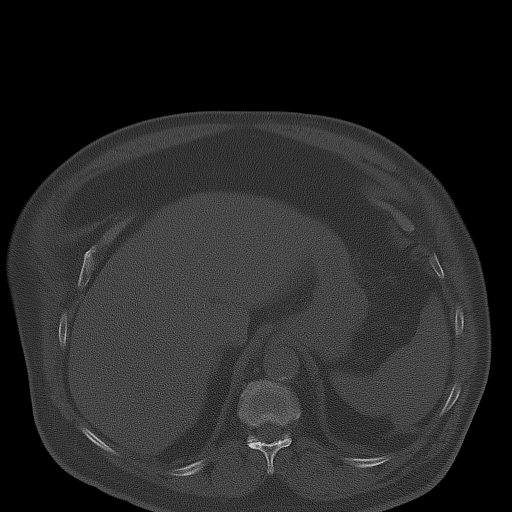

[Series 603: mip range 2 · coronal · 1.72mm/px · 1 of 32 slices shown]
[im 1/32]
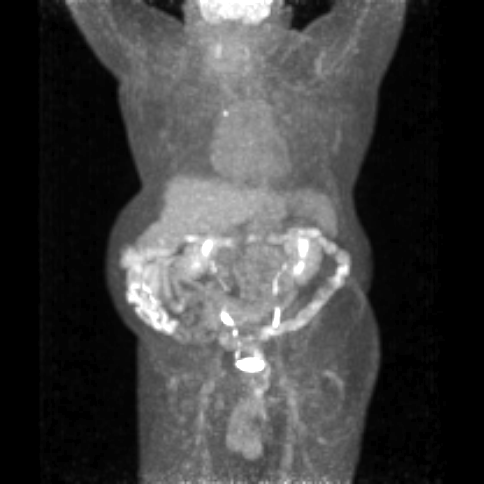

[Series 604: range-ct sk_thigh 5.0 hd_fov-cor-<alpha range> · 3 of 108 slices shown]
[im 1/108]
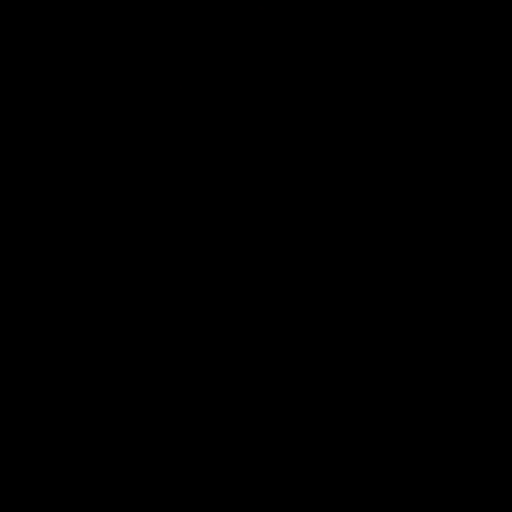
[im 54/108]
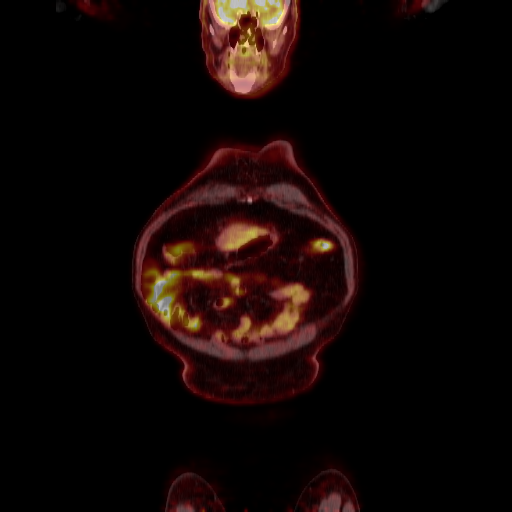
[im 108/108]
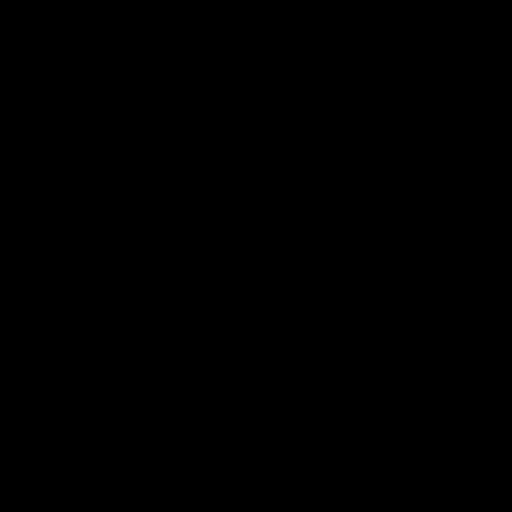

[Series 605: range-ct sk_thigh 5.0 hd_fov-tra-<alpha range> · 5 of 202 slices shown]
[im 1/202]
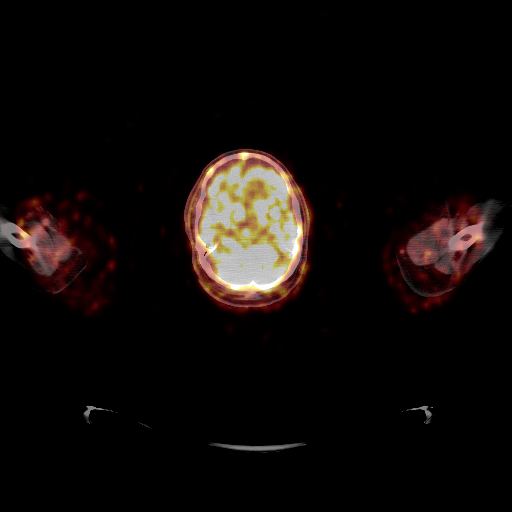
[im 51/202]
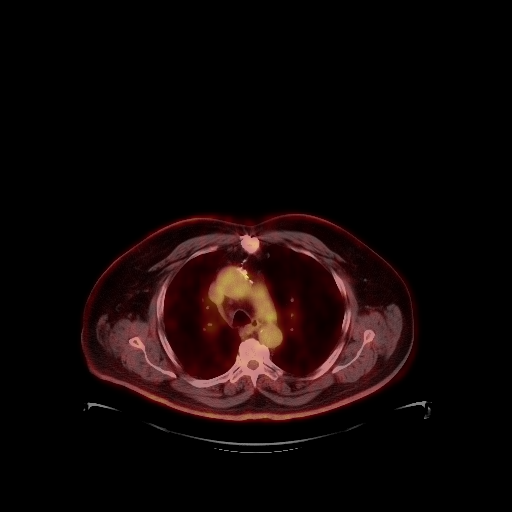
[im 101/202]
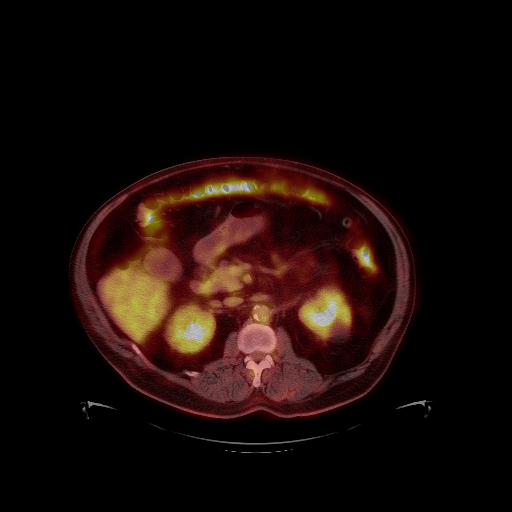
[im 151/202]
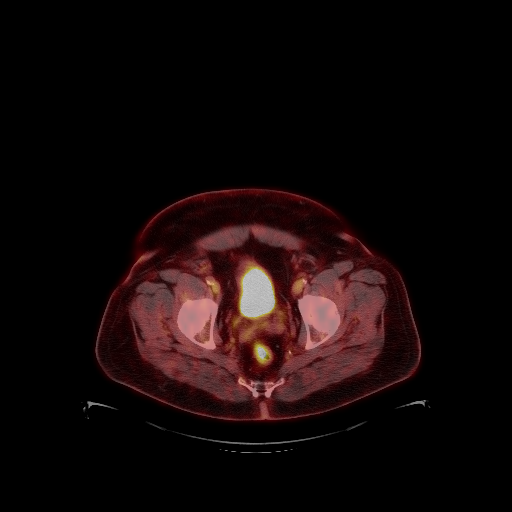
[im 202/202]
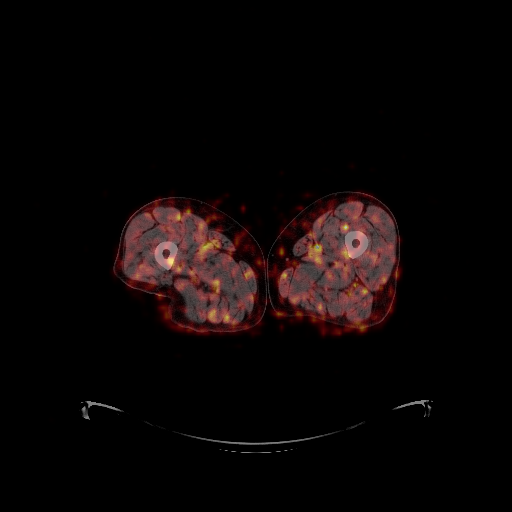

[25 of 25 positions shown; findings below may reference images not displayed]

FINDINGS: Mediastinal blood pool activity: SUV max

Liver activity: SUV max NA

NECK: No hypermetabolic cervical lymphadenopathy.

Mild hypermetabolism along the left vocal fold, without CT
abnormality, likely physiologic.

Incidental CT findings: none

CHEST: 14 mm irregular/spiculated nodule in the medial right upper
lobe (series 8/image 21), previously 9 mm. Max SUV 8.0. This
appearance is highly suspicious for primary bronchogenic neoplasm.

Status post right lower lobe wedge resection. Moderate centrilobular
and paraseptal emphysematous changes, upper lung predominant.

No hypermetabolic thoracic lymphadenopathy.

Incidental CT findings: none

ABDOMEN/PELVIS: No abnormal hypermetabolism in the liver, spleen,
pancreas, or adrenal glands.

No hypermetabolic abdominopelvic lymphadenopathy.

Incidental CT findings: 3.2 cm posterior left renal cyst. Mild
layering gallstones versus gallbladder sludge. Atherosclerotic
calcifications the abdominal aorta and branch vessels.
Prostatomegaly. Thick-walled bladder, sitting chronic bladder
obstruction. Tiny fat containing left inguinal hernia. Fat within
the bilateral inguinal canals.

SKELETON: No focal hypermetabolic activity to suggest skeletal
metastasis.

Incidental CT findings: Degenerative changes of the visualized
thoracolumbar spine.
IMPRESSION: 14 mm irregular/spiculated nodule in the medial right upper lobe,
highly suspicious for primary bronchogenic neoplasm.

No findings suspicious for metastatic disease.

Status post right lower lobe wedge resection.

## 2021-08-23 ENCOUNTER — Ambulatory Visit (HOSPITAL_COMMUNITY): Payer: Medicare Other | Attending: Cardiovascular Disease

## 2021-08-23 DIAGNOSIS — I200 Unstable angina: Secondary | ICD-10-CM | POA: Insufficient documentation

## 2021-08-23 LAB — MYOCARDIAL PERFUSION IMAGING
LV dias vol: 68 mL (ref 62–150)
LV sys vol: 28 mL
Nuc Stress EF: 59 %
Peak HR: 79 {beats}/min
Rest HR: 60 {beats}/min
Rest Nuclear Isotope Dose: 11 mCi
SDS: 2
SRS: 0
SSS: 2
ST Depression (mm): 0 mm
Stress Nuclear Isotope Dose: 32.4 mCi
TID: 1.11

## 2021-08-23 MED ORDER — REGADENOSON 0.4 MG/5ML IV SOLN
0.4000 mg | Freq: Once | INTRAVENOUS | Status: AC
  Administered 2021-08-23: 0.4 mg via INTRAVENOUS

## 2021-08-23 MED ORDER — TECHNETIUM TC 99M TETROFOSMIN IV KIT
32.4000 | PACK | Freq: Once | INTRAVENOUS | Status: AC | PRN
  Administered 2021-08-23: 32.4 via INTRAVENOUS

## 2021-08-23 MED ORDER — TECHNETIUM TC 99M TETROFOSMIN IV KIT
11.0000 | PACK | Freq: Once | INTRAVENOUS | Status: AC | PRN
  Administered 2021-08-23: 11 via INTRAVENOUS

## 2021-08-30 NOTE — Progress Notes (Unsigned)
Office Visit    Patient Name: William Dyer Date of Encounter: 08/31/2021  PCP:  [name]., MD   [facility]  Cardiologist:  Jenkins Rouge, MD  Advanced Practice Provider:  No care team member to display Electrophysiologist:  None    Chief Complaint    William Dyer is a 76 y.o. male with a hx of CABG 2013 SVG to D1, SVG to RCA and LIMA to LAD, COPD, RUL adenocarcinoma presents today for follow-up.   Last Myoview was done 10/30/17 EF 65% diaphragmatic attenuation with no ischemia low risk study.  He was not smoking and on oxygen with a history of right upper lobe adenocarcinoma.  He also has a diagnosis of back pain and was seen by neurology and not considered a surgical candidate.  CT was done 09/21/2020 post partial RUL lung resection with evolving post x-ray changes stable.   Activity level severely limited by back pain.  He does not want to take oxycodone.  He has not had any chest pain and has not needed any nitro.  He was seen by Dr. Johney Frame for new onset PAF 05/27/2021 and started on Eliquis and Cardizem.  TTE ordered and reviewed 06/07/2021 EF 40 to 45%, LA 30 mm no significant valve disease.  Cardizem changed to Lopressor given low EF.  On 06/10/2021 he was still in rate controlled atrial fibrillation.  Discussed DCCV.  He did not want any amiodarone since it "killed his brother's lungs".  He is not an ideal ablation candidate.  Discussed being admitted for Tikosyn if DCCV does not work.  06/24/2021 he had a successful DCCV with Dr. Stanford Breed.  He had had some atypical arthritic like SS CP left and right since procedure.  The pain was clearly positional and pleuritic.  Not thought to be a PE as he was anticoagulated.  May be from the [place] since the pain was located in his rib/cartilage.  Seen 08/11/2021 by myself. He has had continued chest pain.  He states that its transient and feels more like a dull ache.  He describes the pain to be on both the right and  left side of his chest.  The pain is transient and is not necessarily associated with any activity.  He had lung surgery on the right side by Dr. Prescott Gum several years ago and he feels like the right-sided pain might be due to some scar tissue from the surgery. Dr. [name] believed the pain to be Costochondritis and prescribed a steroid taper however, this has not seemed to help.  He is also concerned about his upper right quadrant pain.  On his CT scan it did show that he had gallstones and may need to have his gallbladder removed.  I suggested talking to his primary care provider about this.  He has not noticed that his heart has been beating fast like before.  He was able to notice that his heart rate was elevated because he uses a pulse oximeter at home.  It does not seem like he has gone back into atrial fibrillation since his cardioversion about a month ago.  He has a small amount of edema around his ankles left greater than right but this seems to be chronic and may be more due to arthritis.  Today, he is still having some chest soreness but no pain.  He noticed it when he was turning the wheel on his riding lawnmower.  This sounds more muscular in nature.  His stress test was negative for ischemic changes.  He has an appointment next week to address his gallstones with his PCP.  He will also need a lipid panel at this appointment.  We have restarted him back on his Lasix every other day for fluid overload and weight gain.  Otherwise, doing well from a CV standpoint.  Reports no chest pain, pressure, or tightness.  No orthopnea, PND. Reports no palpitations.    Past Medical History    Past Medical History:  Diagnosis Date   CAD (coronary artery disease)    Remote PCI; s/p CABG x 3 in Feb 2013   COPD (chronic obstructive pulmonary disease) (HCC)    Diabetes mellitus    GERD (gastroesophageal reflux disease)    Heart murmur    History of radiation therapy 03/19/2019   IMRT right lung  03/12/2019-03/19/2019  Dr [name]   Hyperlipidemia    Hypothyroidism    Lung cancer ([place]) 07/08/2000   status post right lower lobectomy for adenocarcinoma   Obesity    Pneumonia    Past Surgical History:  Procedure Laterality Date   CARDIOVERSION N/A 06/24/2021   Procedure: CARDIOVERSION;  Surgeon: Lelon Perla, MD;  Location: [facility];  Service: Cardiovascular;  Laterality: N/A;   CORONARY ANGIOPLASTY  1990's   CORONARY ARTERY BYPASS GRAFT  03/18/2011   Procedure: CORONARY ARTERY BYPASS GRAFTING (CABG);  Surgeon: [name] [name], MD;  Location: [facility];  Service: Open Heart Surgery;  Laterality: N/A;   INGUINAL HERNIA REPAIR  1960's   "? side"   LUNG LOBECTOMY     right, lower; "for lung cancer"   TONSILLECTOMY AND ADENOIDECTOMY  1950's    Allergies  Allergies  Allergen Reactions   Rosuvastatin Calcium     Failed lipitor and Zocor due to muscle aches   Statins     Failed lipitor and Zocor due to muscle aches   Sulfasalazine Other (See Comments)    Patient "draws up" muscularly   Sulfonamide Derivatives Other (See Comments)    Patient "drasws up" muscularly    EKGs/Labs/Other Studies Reviewed:   The following studies were reviewed today:  CT of the chest without contrast 07/28/2021  IMPRESSION: 1. Stable noncontrast chest CT, without evidence of acute findings. 2. Stable postoperative and post radiation changes in the right lung. 3. Coronary and aortic atherosclerosis (ICD10-I70.0). Emphysema (ICD10-J43.9). Previous CABG. 4. Probable cholelithiasis  Echocardiogram 06/07/2021  IMPRESSIONS     1. Left ventricular ejection fraction, by estimation, is 40 to 45%. The  left ventricle has mildly decreased function. The left ventricle  demonstrates global hypokinesis. There is mild concentric left ventricular  hypertrophy. Left ventricular diastolic  parameters are indeterminate.   2. Right ventricular systolic function was not well visualized. The right   ventricular size is normal. Tricuspid regurgitation signal is inadequate  for assessing PA pressure.   3. The mitral valve is normal in structure. No evidence of mitral valve  regurgitation. No evidence of mitral stenosis.   4. The aortic valve is tricuspid. There is mild calcification of the  aortic valve. There is moderate thickening of the aortic valve. Aortic  valve regurgitation is not visualized. Aortic valve sclerosis is present,  with no evidence of aortic valve  stenosis.   5. The inferior vena cava is normal in size with greater than 50%  respiratory variability, suggesting right atrial pressure of 3 mmHg.   Comparison(s): LVEF has decreased since prior report (2013 study).   EKG:  EKG is not ordered today.    Recent Labs: 06/24/2021: BUN 16; Creatinine, Ser 1.20; Hemoglobin 13.9; Potassium 3.9; Sodium 137  Recent Lipid Panel    Component Value Date/Time   CHOL 201 (H) 03/27/2019 1329   TRIG 287 (H) 03/27/2019 1329   HDL 60 03/27/2019 1329   CHOLHDL 3.4 03/27/2019 1329   CHOLHDL 3.4 08/07/2015 0851   VLDL 37 (H) 08/07/2015 0851   LDLCALC 93 03/27/2019 1329   LDLDIRECT 175 (H) 12/24/2018 1211   LDLDIRECT 72.0 03/31/2014 0850    Risk Assessment/Calculations:   CHA2DS2-VASc Score = 5   This indicates a 7.2% annual risk of stroke. The patient's score is based upon: CHF History: 0 HTN History: 1 Diabetes History: 1 Stroke History: 0 Vascular Disease History: 1 Age Score: 2 Gender Score: 0     Home Medications   Current Meds  Medication Sig   albuterol (PROAIR HFA) 108 (90 Base) MCG/ACT inhaler Inhale 1-2 puffs into the lungs every 6 (six) hours as needed for wheezing or shortness of breath.   Alirocumab (PRALUENT) 75 MG/ML SOAJ INJECT 1 PEN INTO THE SKIN EVERY 14 DAYS   apixaban (ELIQUIS) 5 MG TABS tablet Take 1 tablet (5 mg total) by mouth 2 (two) times daily.   clotrimazole-betamethasone (LOTRISONE) cream Apply 1 application. topically daily as needed  (itchy ear). Apply inside Right ear   formoterol (PERFOROMIST) 20 MCG/2ML nebulizer solution Take 2 mLs (20 mcg total) by nebulization 2 (two) times daily. Dx:J43.2   gabapentin (NEURONTIN) 400 MG capsule Take 400 mg by mouth at bedtime.   guaiFENesin (MUCINEX) 600 MG 12 hr tablet Take 600 mg by mouth 2 (two) times daily as needed for to loosen phlegm. For congestion   HYDROcodone-acetaminophen (NORCO) 7.5-325 MG tablet Take 1 tablet by mouth every 6 (six) hours as needed for moderate pain.   levothyroxine (SYNTHROID, LEVOTHROID) 137 MCG tablet Take 68.5-137 mcg by mouth See admin instructions. Take 68.5 mcg Mon Wed and Fri, 137 mcg all other days   Lidocaine HCl 4 % CREA Apply 1 application. topically as needed (pain).   lisinopril (ZESTRIL) 5 MG tablet Take 5 mg by mouth daily.   loratadine (CLARITIN) 10 MG tablet Take 10 mg by mouth daily as needed for allergies or rhinitis.    magnesium oxide (MAG-OX) 400 MG tablet Take 400 mg by mouth daily.   metFORMIN (GLUCOPHAGE-XR) 500 MG 24 hr tablet Take 500 mg by mouth daily with breakfast.   metoprolol tartrate (LOPRESSOR) 25 MG tablet Take 1 tablet (25 mg total) by mouth 2 (two) times daily.   nitroGLYCERIN (NITROSTAT) 0.4 MG SL tablet Place 1 tablet (0.4 mg total) under the tongue every 5 (five) minutes as needed for chest pain (3 doses max).   predniSONE (STERAPRED UNI-PAK 21 TAB) 10 MG (21) TBPK tablet Take 6 tablets by mouth on day 1, then decrease by 1 tablet each day thereafter (6/5/4/3/2/1)   Propylene Glycol (SYSTANE BALANCE) 0.6 % SOLN Place 1 drop into both eyes daily.   tamsulosin (FLOMAX) 0.4 MG CAPS capsule Take 0.4 mg by mouth daily after supper.    [DISCONTINUED] furosemide (LASIX) 20 MG tablet Take 20 mg by mouth daily.     Review of Systems      All other systems reviewed and are otherwise negative except as noted above.  Physical Exam    VS:  BP 126/66   Pulse 66   Ht 5' 3.5" (1.613 m)   Wt 215 lb (97.5 kg)  SpO2 94%    BMI 37.49 kg/m  , BMI Body mass index is 37.49 kg/m.  Wt Readings from Last 3 Encounters:  08/31/21 215 lb (97.5 kg)  08/23/21 211 lb (95.7 kg)  08/11/21 211 lb (95.7 kg)     GEN: Well nourished, well developed, in no acute distress. HEENT: normal. Neck: Supple, no JVD, carotid bruits, or masses. Cardiac: RRR, no murmurs, rubs, or gallops. No clubbing, cyanosis, edema.  Radials/PT 2+ and equal bilaterally.  Respiratory:  Respirations regular and unlabored, clear to auscultation bilaterally. GI: Soft, nontender, nondistended. MS: No deformity or atrophy. Skin: Warm and dry, no rash. Neuro:  Strength and sensation are intact. Psych: Normal affect.  Assessment & Plan    CAD s/p CABG 2013 -Continue GDMT: Eliquis 5 mg twice a day, lisinopril 5 mg daily, metoprolol 25 mg twice a day -Stress test with no ischemic disease -He is interested in changing his metoprolol to the succinate formula to only take it once a day but he does not want to do this today because he just filled his prescription -CABG 2013 nonischemic Myoview 10/30/2017 with EF 65% no wall motion abnormalities -No further chest pain or SOB -Weight is up today and he has not been taking his lasix, please resume 20mg  lasix every other day  PAF -No recurrence since cardioversion about a month ago -Normal sinus rhythm today on exam  HLD -Due for an updated lipid panel next week with his PCP -Last triglycerides were from February 2021 at 287.  Patient states this is good for him since at one point they were over thousand -Last LDL was 93.  Goal less than 70 due to his CAD  DM -Last A1c 6.6 -Surveillance per PCP  Lung CA -Status post lung resection  Hypothyroidism -Continue Synthroid  RBBB -Thought to be due to lung disease f/u EKG yearly  8.  Cholelithiasis -We will need work-up done through PCP  Disposition: Follow up 1 year with Jenkins Rouge, MD or APP.  Signed, Elgie Collard, PA-C 08/31/2021, 11:23  AM [place]

## 2021-08-31 ENCOUNTER — Ambulatory Visit: Payer: Medicare Other | Admitting: Physician Assistant

## 2021-08-31 ENCOUNTER — Encounter: Payer: Self-pay | Admitting: Physician Assistant

## 2021-08-31 VITALS — BP 126/66 | HR 66 | Ht 63.5 in | Wt 215.0 lb

## 2021-08-31 DIAGNOSIS — K8080 Other cholelithiasis without obstruction: Secondary | ICD-10-CM

## 2021-08-31 DIAGNOSIS — I4892 Unspecified atrial flutter: Secondary | ICD-10-CM

## 2021-08-31 DIAGNOSIS — E039 Hypothyroidism, unspecified: Secondary | ICD-10-CM

## 2021-08-31 DIAGNOSIS — I4510 Unspecified right bundle-branch block: Secondary | ICD-10-CM | POA: Diagnosis not present

## 2021-08-31 DIAGNOSIS — I2583 Coronary atherosclerosis due to lipid rich plaque: Secondary | ICD-10-CM

## 2021-08-31 DIAGNOSIS — C3490 Malignant neoplasm of unspecified part of unspecified bronchus or lung: Secondary | ICD-10-CM | POA: Diagnosis not present

## 2021-08-31 DIAGNOSIS — I2510 Atherosclerotic heart disease of native coronary artery without angina pectoris: Secondary | ICD-10-CM

## 2021-08-31 DIAGNOSIS — E785 Hyperlipidemia, unspecified: Secondary | ICD-10-CM

## 2021-08-31 MED ORDER — FUROSEMIDE 20 MG PO TABS: 20.0000 mg | ORAL_TABLET | ORAL | 12 refills | Status: AC

## 2021-08-31 NOTE — Patient Instructions (Signed)
Medication Instructions:   Take Lasix one (1) tablet ( 20 mg) every other day.  *If you need a refill on your cardiac medications before your next appointment, please call your pharmacy*   Lab Work:  Please get fasting lipid panel drawn when you see your PCP next week.   If you have labs (blood work) drawn today and your tests are completely normal, you will receive your results only by: [place] (if you have MyChart) OR A paper copy in the mail If you have any lab test that is abnormal or we need to change your treatment, we will call you to review the results.   Testing/Procedures:  None ordered.   Follow-Up: At [facility], you and your health needs are our priority.  As part of our continuing mission to provide you with exceptional heart care, we have created designated Provider Care Teams.  These Care Teams include your primary Cardiologist (physician) and Advanced Practice Providers (APPs -  Physician Assistants and Nurse Practitioners) who all work together to provide you with the care you need, when you need it.  We recommend signing up for the patient portal called "MyChart".  Sign up information is provided on this After Visit Summary.  MyChart is used to connect with patients for Virtual Visits (Telemedicine).  Patients are able to view lab/test results, encounter notes, upcoming appointments, etc.  Non-urgent messages can be sent to your provider as well.   To learn more about what you can do with MyChart, go to [email or web address].    Your next appointment:   1 year(s)  The format for your next appointment:   In Person  Provider:   Jenkins Rouge, MD     Other Instructions  Your physician wants you to follow-up in: 1 year with Dr. [name]. You will receive a reminder letter in the mail two months in advance. If you don't receive a letter, please call our office to schedule the follow-up appointment.  Recommend weighing daily and keeping a log. Please  call our office if you have weight gain of 2 pounds overnight or 5 pounds in 1 week.   Date  Time Weight                                            DASH Eating Plan DASH stands for Dietary Approaches to Stop Hypertension. The DASH eating plan is a healthy eating plan that has been shown to: Reduce high blood pressure (hypertension). Reduce your risk for type 2 diabetes, heart disease, and stroke. Help with weight loss. What are tips for following this plan? Reading food labels Check food labels for the amount of salt (sodium) per serving. Choose foods with less than 5 percent of the Daily Value of sodium. Generally, foods with less than 300 milligrams (mg) of sodium per serving fit into this eating plan. To find whole grains, look for the word "whole" as the first word in the ingredient list. Shopping Buy products labeled as "low-sodium" or "no salt added." Buy fresh foods. Avoid canned foods and pre-made or frozen meals. Cooking Avoid adding salt when cooking. Use salt-free seasonings or herbs instead of table salt or sea salt. Check with your health care provider or pharmacist before using salt substitutes. Do not fry foods. Cook foods using healthy methods such as baking, boiling, grilling, roasting, and broiling instead. Cook with heart-healthy oils,  such as olive, canola, avocado, soybean, or sunflower oil. Meal planning  Eat a balanced diet that includes: 4 or more servings of fruits and 4 or more servings of vegetables each day. Try to fill one-half of your plate with fruits and vegetables. 6-8 servings of whole grains each day. Less than 6 oz (170 g) of lean meat, poultry, or fish each day. A 3-oz (85-g) serving of meat is about the same size as a deck of cards. One egg equals 1 oz (28 g). 2-3 servings of low-fat dairy each day. One serving is 1 cup (237 mL). 1 serving of nuts, seeds, or beans 5 times each week. 2-3 servings of heart-healthy fats. Healthy fats  called omega-3 fatty acids are found in foods such as walnuts, flaxseeds, fortified milks, and eggs. These fats are also found in cold-water fish, such as sardines, salmon, and mackerel. Limit how much you eat of: Canned or prepackaged foods. Food that is high in trans fat, such as some fried foods. Food that is high in saturated fat, such as fatty meat. Desserts and other sweets, sugary drinks, and other foods with added sugar. Full-fat dairy products. Do not salt foods before eating. Do not eat more than 4 egg yolks a week. Try to eat at least 2 vegetarian meals a week. Eat more home-cooked food and less restaurant, buffet, and fast food. Lifestyle When eating at a restaurant, ask that your food be prepared with less salt or no salt, if possible. If you drink alcohol: Limit how much you use to: 0-1 drink a day for women who are not pregnant. 0-2 drinks a day for men. Be aware of how much alcohol is in your drink. In the [place] one drink equals one 12 oz bottle of beer (355 mL), one 5 oz glass of wine (148 mL), or one 1 oz glass of hard liquor (44 mL). General information Avoid eating more than 2,300 mg of salt a day. If you have hypertension, you may need to reduce your sodium intake to 1,500 mg a day. Work with your health care provider to maintain a healthy body weight or to lose weight. Ask what an ideal weight is for you. Get at least 30 minutes of exercise that causes your heart to beat faster (aerobic exercise) most days of the week. Activities may include walking, swimming, or biking. Work with your health care provider or dietitian to adjust your eating plan to your individual calorie needs. What foods should I eat? Fruits All fresh, dried, or frozen fruit. Canned fruit in natural juice (without added sugar). Vegetables Fresh or frozen vegetables (raw, steamed, roasted, or grilled). Low-sodium or reduced-sodium tomato and vegetable juice. Low-sodium or reduced-sodium tomato sauce  and tomato paste. Low-sodium or reduced-sodium canned vegetables. Grains Whole-grain or whole-wheat bread. Whole-grain or whole-wheat pasta. Brown rice. [place]. Bulgur. Whole-grain and low-sodium cereals. Pita bread. Low-fat, low-sodium crackers. Whole-wheat flour tortillas. Meats and other proteins Skinless chicken or [place]. Ground chicken or [place]. Pork with fat trimmed off. Fish and seafood. Egg whites. Dried beans, peas, or lentils. Unsalted nuts, nut butters, and seeds. Unsalted canned beans. Lean cuts of beef with fat trimmed off. Low-sodium, lean precooked or cured meat, such as sausages or meat loaves. Dairy Low-fat (1%) or fat-free (skim) milk. Reduced-fat, low-fat, or fat-free cheeses. Nonfat, low-sodium ricotta or cottage cheese. Low-fat or nonfat yogurt. Low-fat, low-sodium cheese. Fats and oils Soft margarine without trans fats. Vegetable oil. Reduced-fat, low-fat, or light mayonnaise and salad dressings (reduced-sodium).  Canola, safflower, olive, avocado, soybean, and sunflower oils. Avocado. Seasonings and condiments Herbs. Spices. Seasoning mixes without salt. Other foods Unsalted popcorn and pretzels. Fat-free sweets. The items listed above may not be a complete list of foods and beverages you can eat. Contact a dietitian for more information. What foods should I avoid? Fruits Canned fruit in a light or heavy syrup. Fried fruit. Fruit in cream or butter sauce. Vegetables Creamed or fried vegetables. Vegetables in a cheese sauce. Regular canned vegetables (not low-sodium or reduced-sodium). Regular canned tomato sauce and paste (not low-sodium or reduced-sodium). Regular tomato and vegetable juice (not low-sodium or reduced-sodium). Angie Fava. Olives. Grains Baked goods made with fat, such as croissants, muffins, or some breads. Dry pasta or rice meal packs. Meats and other proteins Fatty cuts of meat. Ribs. Fried meat. Berniece Salines. Bologna, salami, and other precooked or  cured meats, such as sausages or meat loaves. Fat from the back of a pig (fatback). Bratwurst. Salted nuts and seeds. Canned beans with added salt. Canned or smoked fish. Whole eggs or egg yolks. Chicken or [place] with skin. Dairy Whole or 2% milk, cream, and half-and-half. Whole or full-fat cream cheese. Whole-fat or sweetened yogurt. Full-fat cheese. Nondairy creamers. Whipped toppings. Processed cheese and cheese spreads. Fats and oils Butter. Stick margarine. Lard. Shortening. Ghee. Bacon fat. Tropical oils, such as coconut, palm kernel, or palm oil. Seasonings and condiments Onion salt, garlic salt, seasoned salt, table salt, and sea salt. Worcestershire sauce. Tartar sauce. Barbecue sauce. Teriyaki sauce. Soy sauce, including reduced-sodium. Steak sauce. Canned and packaged gravies. Fish sauce. Oyster sauce. Cocktail sauce. Store-bought horseradish. Ketchup. Mustard. Meat flavorings and tenderizers. Bouillon cubes. Hot sauces. Pre-made or packaged marinades. Pre-made or packaged taco seasonings. Relishes. Regular salad dressings. Other foods Salted popcorn and pretzels. The items listed above may not be a complete list of foods and beverages you should avoid. Contact a dietitian for more information. Where to find more information National Heart, Lung, and Blood Institute: https://wilson-eaton.com/ American Heart Association: www.heart.org Academy of Nutrition and Dietetics: www.eatright.[place]: www.kidney.org Summary The DASH eating plan is a healthy eating plan that has been shown to reduce high blood pressure (hypertension). It may also reduce your risk for type 2 diabetes, heart disease, and stroke. When on the DASH eating plan, aim to eat more fresh fruits and vegetables, whole grains, lean proteins, low-fat dairy, and heart-healthy fats. With the DASH eating plan, you should limit salt (sodium) intake to 2,300 mg a day. If you have hypertension, you may need to reduce  your sodium intake to 1,500 mg a day. Work with your health care provider or dietitian to adjust your eating plan to your individual calorie needs. This information is not intended to replace advice given to you by your health care provider. Make sure you discuss any questions you have with your health care provider. Document Revised: 12/28/2018 Document Reviewed: 12/28/2018 Elsevier Patient Education  [place]

## 2021-09-22 ENCOUNTER — Other Ambulatory Visit: Payer: Self-pay | Admitting: Radiation Oncology

## 2021-09-22 DIAGNOSIS — C3411 Malignant neoplasm of upper lobe, right bronchus or lung: Secondary | ICD-10-CM

## 2021-09-24 ENCOUNTER — Telehealth: Payer: Self-pay | Admitting: *Deleted

## 2021-09-24 NOTE — Telephone Encounter (Signed)
Called patient to inform that Dr. [name] had me cancel his fu on 10-07-21 due to fhe fact that he already has has a scan, Dr. [name] wants to do another scan and fu in Dec. 2023, patient was in agreeance to do this

## 2021-10-07 ENCOUNTER — Ambulatory Visit: Payer: Self-pay | Admitting: Radiation Oncology

## 2021-10-27 ENCOUNTER — Telehealth: Payer: Self-pay | Admitting: Cardiology

## 2021-10-27 NOTE — Telephone Encounter (Signed)
Johann Capers will not be in the office until next week. Patient of Dr [name]. All paperwork has been given to Howie Ill, RN and she sent it to [place] [facility] to be completed.

## 2021-10-27 NOTE — Telephone Encounter (Signed)
Patient is calling about his [facility] application. Please call back

## 2021-10-27 NOTE — Telephone Encounter (Signed)
**Note De-Identified Hawk Mones Obfuscation** The pt states that he mailed his BMSPA[employer] application to Nicholes Rough, NP to [address] [place], [place] [address] about 3 weeks ago.  I advised him that I have not received a BMSPAF application from him but that I will checked with the office to see if anyone there has it.

## 2021-10-28 NOTE — Telephone Encounter (Signed)
Dr. [name] has signed and dated form and the paperwork has been faxed and received by [employer].

## 2021-10-28 NOTE — Telephone Encounter (Signed)
**Note De-Identified Aslin Farinas Obfuscation** I have completed the providers page of the pts BMSPAF application for Eliquis and I have e-mailed all to Dr Kyla Balzarine nurse so she can obtain his signature, date it, and to fax all to [facility] at the fax number written on the cover letter included.  I did call the pt to make him aware that we did locate his application and plan to fax it to [employer] today.  I did advise him to start calling [employer] tomorrow at [phone] to check the progress of his application as he did not include his proof of income or his out of pocket RX expense report which is a requirement when applying.  He is requesting that we fax his application as is.

## 2021-12-24 ENCOUNTER — Telehealth: Payer: Self-pay | Admitting: Cardiovascular Disease

## 2021-12-24 NOTE — Telephone Encounter (Signed)
Pt received letter from Gavin Potters in regards to a form the office needs to fill out and sign and he needs to know if the office has received these forms and also if he needs to come and get them to send them back. Please advise.

## 2021-12-24 NOTE — Telephone Encounter (Signed)
**Note De-Identified William Dyer Obfuscation** I answered all of the pts questions concerning him applying for Eliquis assistance through [facility] and I advised him to call [facility] with the questions that I cannot answer.  He asked if I could get a message to Dr Kyla Balzarine nurse asking her to call him concerning his fatigue and weakness since started Metoprolol in February.  He is advised that I am sending this message to her now.

## 2021-12-24 NOTE — Telephone Encounter (Signed)
Called patient back about message. Patient has two questions. Patient stated that he thinks his metoprolol is make him sluggish. Patient stated he is fatigue and just does not like the way he feels on metoprolol. Informed patient that a message  would be sent to Dr. [name] to for advisement on changing medication or not. Patient stated his HR runs from 60 to 80 and his BP is fine.   Patient also wants to know if he should take his pain medication Norco for his back pain. Patient stated he has been avoiding taking it, but he could use it on a day like today. Informed patient that we did not prescribe this medication, but if he needs to take it for pain and his BP and HR are good, then he could take it. Patient wanted Dr. Kyla Balzarine opinion on it.

## 2021-12-27 MED ORDER — METOPROLOL SUCCINATE ER 25 MG PO TB24: 25.0000 mg | ORAL_TABLET | Freq: Every day | ORAL | 3 refills | Status: DC

## 2021-12-27 NOTE — Telephone Encounter (Addendum)
Left message for patient to call back. When he calls back will give him Dr. Kyla Balzarine recommendations.  Josue Hector, MD  Michaelyn Barter, RN Caller: Unspecified (3 days ago,  1:58 PM) Ok to take Norco He may go back into afib if he stops lopressor Can try Toprol 25 mg daily instead

## 2021-12-27 NOTE — Telephone Encounter (Signed)
Patient called back. Informed him of Dr. Kyla Balzarine advisement. Patient verbalized understanding.

## 2021-12-29 ENCOUNTER — Telehealth: Payer: Self-pay | Admitting: *Deleted

## 2021-12-29 NOTE — Telephone Encounter (Signed)
Called patient to inform of CT for 01-17-22- arrival time- 10:15 am @ [facility], no restrictions to test, patient to receive results from Dr. [name] on 01-20-22 @ 11:15 am, lvm for a return call

## 2022-01-17 ENCOUNTER — Ambulatory Visit (HOSPITAL_COMMUNITY): Payer: Medicare Other

## 2022-01-20 ENCOUNTER — Ambulatory Visit: Payer: Self-pay | Admitting: Radiation Oncology

## 2022-01-25 ENCOUNTER — Ambulatory Visit (HOSPITAL_COMMUNITY)
Admission: RE | Admit: 2022-01-25 | Discharge: 2022-01-25 | Disposition: A | Discharge status: Home / Self Care | Payer: Medicare Other | Source: Ambulatory Visit | Attending: Radiation Oncology | Admitting: Radiation Oncology

## 2022-01-25 DIAGNOSIS — C3411 Malignant neoplasm of upper lobe, right bronchus or lung: Secondary | ICD-10-CM | POA: Insufficient documentation

## 2022-01-26 NOTE — Progress Notes (Signed)
Radiation Oncology         [phone] ________________________________  Name: William Dyer MRN: [id number]  Date: 01/27/2022  DOB: September 27, 1945  Follow-Up Visit Note  CC: [name]., MD  Langston Masker Lutricia Horsfall, MD  No diagnosis found.  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 He was last seen here for follow up on 04/05/21. Since his last visit, the patient followed up with Dr. Dan Europe at [place] on 05/31/21. During which time, the patient denied any new or worsening symptoms, and he was instructed to continue Brovana and Pulmicort nebulizer's twice daily along with PRN albuterol for management of his COPD.    Per his most recent visit with pulmonology on 10/26/21, the patient was noted to have greatly benefited from BiPAP use nightly and will continue with nightly use.       His most recent chest CT without contrast on 01/25/22 demonstrated no evidence of residual/recurrent tumor or metastatic disease, and stability of a likely benign 6 mm left lower lobe lung nodule. CT also showed stable post treatment changes within the right lung as noted on his prior CT from 07/28/21.   Other imaging performed in the interval includes an abdominal ultrasound on 10/07/21 for evaluation of RUQ abdominal pain x several months (performed at [facility]), which showed nonspecific increased echogenicity throughout the liver possibly correlated with hepatic steatosis, a grossly normal appearing bladder with some stones, and atherosclerotic changes in a nonaneurysmal aorta. No other abnormalities were  appreciated otherwise.     Of note: the patient is followed by cardiology for atrial flutter and underwent cardioversion on 06/24/21.           The patient also presented to cardiology on 07/20/21 with severe right sided chest pain x 4 weeks this past June prompting a chest CT on 07/28/21 which showed no acute findings and stable post-treatment changes in the right lung. His pain was ultimately deemed to be pleuritic given that it was positional (possibly related to recent [facility]).  ***    Allergies:  is allergic to rosuvastatin calcium, statins, sulfasalazine, and sulfonamide derivatives.  Meds: Current Outpatient Medications  Medication Sig Dispense Refill   metoprolol succinate (TOPROL XL) 25 MG 24 hr tablet Take 1 tablet (25 mg total) by mouth daily. 90 tablet 3   albuterol (PROAIR HFA) 108 (90 Base) MCG/ACT inhaler Inhale 1-2 puffs into the lungs every 6 (six) hours as needed for wheezing or shortness of breath. 16 g 0   Alirocumab (PRALUENT) 75 MG/ML SOAJ INJECT 1 PEN INTO THE SKIN EVERY 14 DAYS 2 mL 11   apixaban (ELIQUIS) 5 MG TABS tablet Take 1 tablet (5 mg total) by mouth 2 (two) times daily. 60 tablet 5   clotrimazole-betamethasone (LOTRISONE) cream Apply 1 application. topically daily as needed (itchy ear). Apply inside Right ear     formoterol (PERFOROMIST) 20 MCG/2ML nebulizer solution Take 2 mLs (20 mcg total) by nebulization 2 (two) times daily. Dx:J43.2 120 mL 0   furosemide (LASIX) 20 MG tablet Take 1 tablet (20 mg total) by mouth every other day. 15  tablet 12   gabapentin (NEURONTIN) 400 MG capsule Take 400 mg by mouth at bedtime.     guaiFENesin (MUCINEX) 600 MG 12 hr tablet Take 600 mg by mouth 2 (two) times daily as needed for to loosen phlegm. For congestion     HYDROcodone-acetaminophen (NORCO) 7.5-325 MG tablet Take 1 tablet by mouth every 6 (six) hours as needed for moderate pain.     levothyroxine (SYNTHROID, LEVOTHROID) 137 MCG tablet Take 68.5-137 mcg by mouth See  admin instructions. Take 68.5 mcg Mon Wed and Fri, 137 mcg all other days     Lidocaine HCl 4 % CREA Apply 1 application. topically as needed (pain).     lisinopril (ZESTRIL) 5 MG tablet Take 5 mg by mouth daily.     loratadine (CLARITIN) 10 MG tablet Take 10 mg by mouth daily as needed for allergies or rhinitis.      magnesium oxide (MAG-OX) 400 MG tablet Take 400 mg by mouth daily.     metFORMIN (GLUCOPHAGE-XR) 500 MG 24 hr tablet Take 500 mg by mouth daily with breakfast.     nitroGLYCERIN (NITROSTAT) 0.4 MG SL tablet Place 1 tablet (0.4 mg total) under the tongue every 5 (five) minutes as needed for chest pain (3 doses max). 25 tablet 3   predniSONE (STERAPRED UNI-PAK 21 TAB) 10 MG (21) TBPK tablet Take 6 tablets by mouth on day 1, then decrease by 1 tablet each day thereafter (6/5/4/3/2/1) 1 each 0   Propylene Glycol (SYSTANE BALANCE) 0.6 % SOLN Place 1 drop into both eyes daily.     tamsulosin (FLOMAX) 0.4 MG CAPS capsule Take 0.4 mg by mouth daily after supper.      No current facility-administered medications for this encounter.    Physical Findings: The patient is in no acute distress. Patient is alert and oriented.  vitals were not taken for this visit. .  No significant changes. Lungs are clear to auscultation bilaterally. Heart has regular rate and rhythm. No palpable cervical, supraclavicular, or axillary adenopathy. Abdomen soft, non-tender, normal bowel sounds.   Lab Findings: Lab Results  Component Value Date   WBC 16.2 (H) 05/15/2013   HGB 13.9 06/24/2021   HCT 41.0 06/24/2021   MCV 92.7 05/15/2013   PLT 223 05/15/2013    Radiographic Findings: CT CHEST WO CONTRAST  Result Date: 01/26/2022 CLINICAL DATA:  Restaging non-small cell lung cancer. * Tracking Code: BO * EXAM: CT CHEST WITHOUT CONTRAST TECHNIQUE: Multidetector CT imaging of the chest was performed following the standard protocol without IV contrast. RADIATION DOSE REDUCTION: This exam was performed  according to the departmental dose-optimization program which includes automated exposure control, adjustment of the mA and/or kV according to patient size and/or use of iterative reconstruction technique. COMPARISON:  07/28/2021 FINDINGS: Cardiovascular: Previous median sternotomy and CABG procedure. Heart size appears within normal limits. No pericardial effusion identified. Aortic atherosclerosis. Mediastinum/Nodes: No enlarged axillary or mediastinal lymph nodes. Thyroid gland, trachea and esophagus demonstrate no significant findings. Lungs/Pleura: No pleural effusion or acute airspace disease. Moderate to severe paraseptal and centrilobular emphysema. Geographic area of masslike architectural distortion and fibrosis within the perihilar right upper lobe is again noted reflecting changes secondary to external beam radiation. Post surgical changes are again noted within the right lower lobe from prior wedge resection. Stable triangular-shaped nodule within the periphery of the left lower lobe measuring 6 mm, image 118/7. No new or suspicious lung nodules. Upper Abdomen: No acute abnormality.  Gallstones identified. Musculoskeletal: No chest wall mass  or suspicious bone lesions identified. IMPRESSION: 1. Stable CT of the chest. No specific findings identified to suggest residual or recurrent tumor or metastatic disease. 2. Stable post treatment changes within the right lung. 3. Stable 6 mm left lower lobe lung nodule, likely benign. 4. Gallstones. 5. Aortic Atherosclerosis (ICD10-I70.0) and Emphysema (ICD10-J43.9). Electronically Signed   By: Kerby Moors M.D.   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  ***  Plan:  ***   *** minutes of total time was spent for this patient encounter, including preparation, face-to-face counseling with the patient and  coordination of care, physical exam, and documentation of the encounter. ____________________________________  Blair Promise, PhD, MD  This document serves as a record of services personally performed by [name], MD. It was created on his behalf by Roney Mans, a trained medical scribe. The creation of this record is based on the scribe's personal observations and the provider's statements to them. This document has been checked and approved by the attending provider.

## 2022-01-27 ENCOUNTER — Ambulatory Visit
Admission: RE | Admit: 2022-01-27 | Discharge: 2022-01-27 | Disposition: A | Discharge status: Home / Self Care | Payer: Medicare Other | Source: Ambulatory Visit | Attending: Radiation Oncology | Admitting: Radiation Oncology

## 2022-01-27 ENCOUNTER — Encounter: Payer: Self-pay | Admitting: Radiation Oncology

## 2022-01-27 VITALS — BP 165/72 | HR 74 | Temp 97.9°F | Resp 24 | Ht 63.5 in | Wt 219.0 lb

## 2022-01-27 DIAGNOSIS — C3411 Malignant neoplasm of upper lobe, right bronchus or lung: Secondary | ICD-10-CM

## 2022-01-27 DIAGNOSIS — I890 Lymphedema, not elsewhere classified: Secondary | ICD-10-CM | POA: Diagnosis not present

## 2022-01-27 DIAGNOSIS — Z7984 Long term (current) use of oral hypoglycemic drugs: Secondary | ICD-10-CM | POA: Insufficient documentation

## 2022-01-27 DIAGNOSIS — I4892 Unspecified atrial flutter: Secondary | ICD-10-CM | POA: Insufficient documentation

## 2022-01-27 DIAGNOSIS — Z7901 Long term (current) use of anticoagulants: Secondary | ICD-10-CM | POA: Insufficient documentation

## 2022-01-27 DIAGNOSIS — R918 Other nonspecific abnormal finding of lung field: Secondary | ICD-10-CM | POA: Diagnosis present

## 2022-01-27 DIAGNOSIS — J432 Centrilobular emphysema: Secondary | ICD-10-CM | POA: Insufficient documentation

## 2022-01-27 DIAGNOSIS — Z79899 Other long term (current) drug therapy: Secondary | ICD-10-CM | POA: Diagnosis not present

## 2022-01-27 DIAGNOSIS — I700 Atherosclerosis of aorta: Secondary | ICD-10-CM | POA: Diagnosis not present

## 2022-01-27 DIAGNOSIS — K8020 Calculus of gallbladder without cholecystitis without obstruction: Secondary | ICD-10-CM | POA: Diagnosis not present

## 2022-01-27 DIAGNOSIS — R911 Solitary pulmonary nodule: Secondary | ICD-10-CM

## 2022-01-27 NOTE — Progress Notes (Signed)
William Dyer is here today for follow up post radiation to the lung.  Lung Side: Right, patient completed on 03/19/19  Does the patient complain of any of the following: Pain: Patient reports having discomfort to right back where lung was removed.  Shortness of breath w/wo exertion: Yes mostly on exertion.  Cough: No Hemoptysis: No Pain with swallowing: No Swallowing/choking concerns: No Appetite: Good Weight:  Wt Readings from Last 3 Encounters:  01/27/22 219 lb (99.3 kg)  08/31/21 215 lb (97.5 kg)  08/23/21 211 lb (95.7 kg)   Energy Level: Fair Post radiation skin Changes: No    Additional comments if applicable:   BP (!) 815/94 (BP Location: Left Arm, Patient Position: Sitting, Cuff Size: Large)   Pulse 74   Temp 97.9 F (36.6 C)   Resp (!) 24   Ht 5' 3.5" (1.613 m)   Wt 219 lb (99.3 kg)   SpO2 93%   BMI 38.19 kg/m

## 2022-02-17 ENCOUNTER — Other Ambulatory Visit (HOSPITAL_COMMUNITY): Payer: Self-pay

## 2022-05-31 ENCOUNTER — Other Ambulatory Visit (HOSPITAL_COMMUNITY): Payer: Self-pay

## 2022-05-31 ENCOUNTER — Telehealth: Payer: Self-pay | Admitting: Cardiovascular Disease

## 2022-05-31 NOTE — Telephone Encounter (Signed)
90 day supply of Praluent is only $11.20

## 2022-05-31 NOTE — Telephone Encounter (Signed)
Pt c/o medication issue:  1. Name of Medication: Alirocumab (PRALUENT) 75 MG/ML SOAJ   2. How are you currently taking this medication (dosage and times per day)? As prescribed   3. Are you having a reaction (difficulty breathing--STAT)? No   4. What is your medication issue? Patient is calling needing assistance with getting the cost of this medication lowered again.  He also states he recently had labs and his vitamin D was very low. He reports he was advised by the cardiologist that shocked his heart back into rhythm not to take D3 anymore, so he would like to discuss this upon callback as well. Please advise.

## 2022-06-01 ENCOUNTER — Other Ambulatory Visit: Payer: Self-pay | Admitting: Cardiovascular Disease

## 2022-06-01 MED ORDER — PRALUENT 75 MG/ML ~~LOC~~ SOAJ: 75.0000 mg | SUBCUTANEOUS | 3 refills | Status: DC

## 2022-06-01 NOTE — Telephone Encounter (Signed)
Called patient back to let him know Dr. Jens Som advised that it is okay to take vitamin D. Patient verbalized understanding.

## 2022-06-01 NOTE — Telephone Encounter (Signed)
Refill sent in

## 2022-06-01 NOTE — Telephone Encounter (Signed)
Patient needs refill on his Praluent. Will place order and have pharmacist check before signing.   Also, Patient had a question about vitamin D. He was told at his cardioversion last year to stop Vitamin D supplement. Now patient's Vitamin D levels are down. Patient wants to know if he can go back to taking his Vitamin D3. Will forward to Dr. Jens Som for advisement.

## 2022-06-10 ENCOUNTER — Other Ambulatory Visit: Payer: Self-pay | Admitting: *Deleted

## 2022-06-10 DIAGNOSIS — I4892 Unspecified atrial flutter: Secondary | ICD-10-CM

## 2022-06-10 MED ORDER — APIXABAN 5 MG PO TABS
5.0000 mg | ORAL_TABLET | Freq: Two times a day (BID) | ORAL | 5 refills | Status: DC
Start: 2022-06-10 — End: 2023-02-06

## 2022-06-10 NOTE — Telephone Encounter (Signed)
Prescription refill request for Eliquis received. Indication:aflutter Last office visit: Asa Lente 08/31/2021 Scr: 0.99, 06/07/2022 Age: 77 yo  Weight: 99.3 kg   Refill sent.

## 2022-07-11 ENCOUNTER — Telehealth: Payer: Self-pay | Admitting: *Deleted

## 2022-07-11 NOTE — Telephone Encounter (Signed)
Called patient to inform of Ct for 07-29-22- arrival time- 1:15 pm @ [facility], no restrictions to test, patient to receive results from Dr. Roselind Messier on 08-01-22 @ 11 am for results, spoke with patient and he is aware of these appts. and the instructions

## 2022-07-29 ENCOUNTER — Encounter (HOSPITAL_COMMUNITY): Payer: Self-pay

## 2022-07-29 ENCOUNTER — Ambulatory Visit (HOSPITAL_COMMUNITY)
Admission: RE | Admit: 2022-07-29 | Discharge: 2022-07-29 | Disposition: A | Discharge status: Home / Self Care | Payer: Medicare Other | Source: Ambulatory Visit | Attending: Radiology | Admitting: Radiology

## 2022-07-29 DIAGNOSIS — R911 Solitary pulmonary nodule: Secondary | ICD-10-CM

## 2022-07-30 NOTE — Progress Notes (Signed)
Radiation Oncology         (336) [phone] ________________________________  Name: William Dyer MRN: [id number]  Date: 08/01/2022  DOB: 09-02-1945  Follow-Up Visit Note  CC: Genevive Bi, PA-C  Dan Maker., MD  No diagnosis found.  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 He was last seen here for follow-up on 01/27/22. Since his last visit, the patient has continued to follow with Dr. Lottie Rater at [facility] for surveillance of his COPD and OSA.   He also presented to the [facility] ED on 06/05/22 in the setting of symptomatic cholelithiasis. He subsequently underwent a laparoscopic cholecystectomy with pathology showing no evidence of malignancy. He also had a CT AP with contrast performed at that time which showed no acute findings to account for the patient's symptoms, and cholelithiasis without evidence of cholecystitis. (Further encounter details are not available at this time).    Other imaging performed in the interval includes an additional CT AP performed on 06/27/22 (in the setting of RLQ abdominal pain) which showed no acute abnormalities within the abdomen or pelvis. Chronic findings notes included mild hepatic steatosis, left colonic diverticulosis, and prostatomegaly.       His most recent chest CT without contrast on 07/29/22 demonstrated: ***.                        Allergies:  is allergic to rosuvastatin calcium, statins, sulfasalazine, and sulfonamide derivatives.  Meds: Current Outpatient Medications  Medication Sig Dispense Refill    albuterol (PROAIR HFA) 108 (90 Base) MCG/ACT inhaler Inhale 1-2 puffs into the lungs every 6 (six) hours as needed for wheezing or shortness of breath. (Patient not taking: Reported on 01/27/2022) 16 g 0   Alirocumab (PRALUENT) 75 MG/ML SOAJ Inject 1 mL (75 mg total) into the skin every 14 (fourteen) days. 6 mL 3   apixaban (ELIQUIS) 5 MG TABS tablet Take 1 tablet (5 mg total) by mouth 2 (two) times daily. 60 tablet 5   clotrimazole-betamethasone (LOTRISONE) cream Apply 1 application. topically daily as needed (itchy ear). Apply inside Right ear     formoterol (PERFOROMIST) 20 MCG/2ML nebulizer solution Take 2 mLs (20 mcg total) by nebulization 2 (two) times daily. Dx:J43.2 120 mL 0   furosemide (LASIX) 20 MG tablet Take 1 tablet (20 mg total) by mouth every other day. 15 tablet 12   gabapentin (NEURONTIN) 400 MG capsule Take 400 mg by mouth at bedtime.     guaiFENesin (MUCINEX) 600 MG 12 hr tablet Take 600 mg by mouth 2 (two) times daily as needed for to loosen phlegm. For congestion     HYDROcodone-acetaminophen (NORCO) 7.5-325 MG tablet Take 1 tablet by mouth every 6 (six) hours as needed for moderate pain.     levothyroxine (SYNTHROID, LEVOTHROID) 137 MCG tablet Take 68.5-137 mcg by mouth See admin instructions. Take 68.5 mcg Mon Wed and Fri, 137 mcg all other days     Lidocaine HCl 4 % CREA Apply 1 application. topically as needed (pain).     lisinopril (ZESTRIL) 5 MG  tablet Take 5 mg by mouth daily.     loratadine (CLARITIN) 10 MG tablet Take 10 mg by mouth daily as needed for allergies or rhinitis.      magnesium oxide (MAG-OX) 400 MG tablet Take 400 mg by mouth daily.     metFORMIN (GLUCOPHAGE-XR) 500 MG 24 hr tablet Take 1,000 mg by mouth daily with breakfast.     metoprolol succinate (TOPROL XL) 25 MG 24 hr tablet Take 1 tablet (25 mg total) by mouth daily. 90 tablet 3   nitroGLYCERIN (NITROSTAT) 0.4 MG SL tablet Place 1 tablet (0.4 mg total) under the tongue every 5 (five) minutes as  needed for chest pain (3 doses max). 25 tablet 3   predniSONE (STERAPRED UNI-PAK 21 TAB) 10 MG (21) TBPK tablet Take 6 tablets by mouth on day 1, then decrease by 1 tablet each day thereafter (6/5/4/3/2/1) (Patient not taking: Reported on 01/27/2022) 1 each 0   Propylene Glycol (SYSTANE BALANCE) 0.6 % SOLN Place 1 drop into both eyes daily.     tamsulosin (FLOMAX) 0.4 MG CAPS capsule Take 0.4 mg by mouth daily after supper.      No current facility-administered medications for this encounter.    Physical Findings: The patient is in no acute distress. Patient is alert and oriented.  vitals were not taken for this visit. .  No significant changes. Lungs are clear to auscultation bilaterally. Heart has regular rate and rhythm. No palpable cervical, supraclavicular, or axillary adenopathy. Abdomen soft, non-tender, normal bowel sounds.   Lab Findings: Lab Results  Component Value Date   WBC 16.2 (H) 05/15/2013   HGB 13.9 06/24/2021   HCT 41.0 06/24/2021   MCV 92.7 05/15/2013   PLT 223 05/15/2013    Radiographic Findings: No results found.  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  ***  Plan:  ***   *** minutes of total time was spent for this patient encounter, including preparation, face-to-face counseling with the patient and coordination of care, physical exam, and documentation of the encounter. ____________________________________  Billie Lade, PhD, MD  This document serves as a record of services personally performed by Antony Blackbird, MD. It was created on his behalf by Neena Rhymes, a trained medical scribe. The creation of this record is based on the scribe's personal observations and the provider's statements to them. This document has been checked and approved by the attending provider.

## 2022-08-01 ENCOUNTER — Other Ambulatory Visit: Payer: Self-pay

## 2022-08-01 ENCOUNTER — Encounter: Payer: Self-pay | Admitting: Radiation Oncology

## 2022-08-01 ENCOUNTER — Ambulatory Visit
Admission: RE | Admit: 2022-08-01 | Discharge: 2022-08-01 | Disposition: A | Discharge status: Home / Self Care | Payer: Medicare Other | Source: Ambulatory Visit | Attending: Radiation Oncology | Admitting: Radiation Oncology

## 2022-08-01 VITALS — BP 136/60 | HR 71 | Temp 97.7°F | Resp 20 | Ht 64.0 in | Wt 215.4 lb

## 2022-08-01 DIAGNOSIS — J432 Centrilobular emphysema: Secondary | ICD-10-CM | POA: Diagnosis not present

## 2022-08-01 DIAGNOSIS — I2510 Atherosclerotic heart disease of native coronary artery without angina pectoris: Secondary | ICD-10-CM | POA: Diagnosis not present

## 2022-08-01 DIAGNOSIS — Z7901 Long term (current) use of anticoagulants: Secondary | ICD-10-CM | POA: Insufficient documentation

## 2022-08-01 DIAGNOSIS — I700 Atherosclerosis of aorta: Secondary | ICD-10-CM | POA: Insufficient documentation

## 2022-08-01 DIAGNOSIS — Z923 Personal history of irradiation: Secondary | ICD-10-CM | POA: Insufficient documentation

## 2022-08-01 DIAGNOSIS — R918 Other nonspecific abnormal finding of lung field: Secondary | ICD-10-CM | POA: Diagnosis present

## 2022-08-01 DIAGNOSIS — Z79899 Other long term (current) drug therapy: Secondary | ICD-10-CM | POA: Insufficient documentation

## 2022-08-01 DIAGNOSIS — R911 Solitary pulmonary nodule: Secondary | ICD-10-CM

## 2022-08-01 DIAGNOSIS — Z7984 Long term (current) use of oral hypoglycemic drugs: Secondary | ICD-10-CM | POA: Insufficient documentation

## 2022-08-01 DIAGNOSIS — C3411 Malignant neoplasm of upper lobe, right bronchus or lung: Secondary | ICD-10-CM

## 2022-08-01 NOTE — Progress Notes (Signed)
William Dyer is here today for follow up post radiation to the lung.  Lung Side:  rt lung 03-19-19  Does the patient complain of any of the following: Pain: Pain in lower back  rt side Shortness of breath w/wo exertion: with activity and at rest Cough: denies Hemoptysis: denies Pain with swallowing: denies Swallowing/choking concerns: denies Appetite: good Energy Level: mild fatigue Post radiation skin Changes:no issues Vitals:   08/01/22 1051  BP: 136/60  Pulse: 71  Resp: 20  Temp: 97.7 F (36.5 C)  SpO2: 93%  Weight: 97.7 kg  Height: 5\' 4"  (1.626 m)       Additional comments if applicable:

## 2022-09-01 ENCOUNTER — Encounter: Payer: Self-pay | Admitting: Physician Assistant

## 2022-09-01 ENCOUNTER — Ambulatory Visit: Payer: Medicare Other | Attending: Physician Assistant | Admitting: Physician Assistant

## 2022-09-01 ENCOUNTER — Ambulatory Visit: Payer: Medicare Other | Admitting: Physician Assistant

## 2022-09-01 VITALS — BP 128/68 | HR 91 | Ht 64.0 in | Wt 208.0 lb

## 2022-09-01 DIAGNOSIS — E785 Hyperlipidemia, unspecified: Secondary | ICD-10-CM

## 2022-09-01 DIAGNOSIS — I480 Paroxysmal atrial fibrillation: Secondary | ICD-10-CM | POA: Diagnosis not present

## 2022-09-01 DIAGNOSIS — I10 Essential (primary) hypertension: Secondary | ICD-10-CM | POA: Diagnosis not present

## 2022-09-01 DIAGNOSIS — I2510 Atherosclerotic heart disease of native coronary artery without angina pectoris: Secondary | ICD-10-CM | POA: Diagnosis not present

## 2022-09-01 DIAGNOSIS — I25810 Atherosclerosis of coronary artery bypass graft(s) without angina pectoris: Secondary | ICD-10-CM

## 2022-09-01 DIAGNOSIS — E039 Hypothyroidism, unspecified: Secondary | ICD-10-CM

## 2022-09-01 DIAGNOSIS — I4510 Unspecified right bundle-branch block: Secondary | ICD-10-CM

## 2022-09-01 MED ORDER — FENOFIBRATE 145 MG PO TABS: 145.0000 mg | ORAL_TABLET | Freq: Every day | ORAL | 3 refills | Status: DC

## 2022-09-01 NOTE — Progress Notes (Signed)
Office Visit    Patient Name: William Dyer Date of Encounter: 09/01/2022  PCP:  [place]   [facility]  Cardiologist:  [name], MD  Advanced Practice Provider:  No care team member to display Electrophysiologist:  None    Chief Complaint    William Dyer is a 77 y.o. male with a hx of CABG 2013 SVG to D1, SVG to RCA and LIMA to LAD, COPD, RUL adenocarcinoma presents today for follow-up.   Last Myoview was done 10/30/17 EF 65% diaphragmatic attenuation with no ischemia low risk study.  He was not smoking and on oxygen with a history of right upper lobe adenocarcinoma.  He also has a diagnosis of back pain and was seen by neurology and not considered a surgical candidate.  CT was done 09/21/2020 post partial RUL lung resection with evolving post x-ray changes stable.   Activity level severely limited by back pain.  He does not want to take oxycodone.  He has not had any chest pain and has not needed any nitro.  He was seen by Dr. Shari Prows for new onset PAF 05/27/2021 and started on Eliquis and Cardizem.  TTE ordered and reviewed 06/07/2021 EF 40 to 45%, LA 30 mm no significant valve disease.  Cardizem changed to Lopressor given low EF.  On 06/10/2021 he was still in rate controlled atrial fibrillation.  Discussed DCCV.  He did not want any amiodarone since it "killed his brother's lungs".  He is not an ideal ablation candidate.  Discussed being admitted for Tikosyn if DCCV does not work.  06/24/2021 he had a successful DCCV with Dr. Jens Som.  He had had some atypical arthritic like SS CP left and right since procedure.  The pain was clearly positional and pleuritic.  Not thought to be a PE as he was anticoagulated.  May be from the DCCV since the pain was located in his rib/cartilage.  Seen 08/11/2021 by myself. He has had continued chest pain.  He states that its transient and feels more like a dull ache.  He describes the pain to be on both the right  and left side of his chest.  The pain is transient and is not necessarily associated with any activity.  He had lung surgery on the right side by Dr. Donata Clay several years ago and he feels like the right-sided pain might be due to some scar tissue from the surgery. Dr. Eden Emms believed the pain to be Costochondritis and prescribed a steroid taper however, this has not seemed to help.  He is also concerned about his upper right quadrant pain.  On his CT scan it did show that he had gallstones and may need to have his gallbladder removed.  I suggested talking to his primary care provider about this.  He has not noticed that his heart has been beating fast like before.  He was able to notice that his heart rate was elevated because he uses a pulse oximeter at home.  It does not seem like he has gone back into atrial fibrillation since his cardioversion about a month ago.  He has a small amount of edema around his ankles left greater than right but this seems to be chronic and may be more due to arthritis.  He saw me 08/31/2021, he is still having some chest soreness but no pain.  He noticed it when he was turning the wheel on his riding lawnmower.  This sounds more  muscular in nature.  His stress test was negative for ischemic changes.  He has an appointment next week to address his gallstones with his PCP.  He will also need a lipid panel at this appointment.  We have restarted him back on his Lasix every other day for fluid overload and weight gain.  Otherwise, doing well from a CV standpoint.  Today, he has a sharp pain in the center of his chest. Last for a few seconds, no SOB. Two weeks ago he had covid. He has taken the shots. July temps 102. RSV, flu and COPD has affected his breathing, 3L [place] today.  He is feeling a little better from the COVID.  He wants to stop some of his medications.  He feels like he is taking too many.  Blood pressure is well-controlled today.  He wants to be off lisinopril because he  feels like it is making him feel bad.  Discussed a lisinopril holiday for a few weeks and then starting losartan if blood pressure creeps up.  I asked him to keep track of his blood pressure for me for 2 weeks and send me those values.  Also discussed getting a proper blood pressure cuff (he only has a wrist cuff at home).  He also wants to come off Eliquis.  Initially, discussed starting fish oil however with being on a blood thinner fenofibrate would be a better option for his triglycerides. Recheck lipid panel in a few month.  Reports no shortness of breath nor dyspnea on exertion.  No edema, orthopnea, PND. Reports no palpitations.   Past Medical History    Past Medical History:  Diagnosis Date   CAD (coronary artery disease)    Remote PCI; s/p CABG x 3 in Feb 2013   COPD (chronic obstructive pulmonary disease) (HCC)    Diabetes mellitus    GERD (gastroesophageal reflux disease)    Heart murmur    History of radiation therapy 03/19/2019   IMRT right lung 03/12/2019-03/19/2019  Dr Antony Blackbird   Hyperlipidemia    Hypothyroidism    Lung cancer (HCC) 07/08/2000   status post right lower lobectomy for adenocarcinoma   Obesity    Pneumonia    Past Surgical History:  Procedure Laterality Date   CARDIOVERSION N/A 06/24/2021   Procedure: CARDIOVERSION;  Surgeon: Lewayne Bunting, MD;  [facility] END[facility];  Service: Cardiovascular;  Laterality: N/A;   CORONARY ANGIOPLASTY  1990's   CORONARY ARTERY BYPASS GRAFT  03/18/2011   Procedure: CORONARY ARTERY BYPASS GRAFTING (CABG);  Surgeon: Kathlee Nations Suann Larry, MD;  Location: [facility] OR;  Service: Open Heart Surgery;  Laterality: N/A;   INGUINAL HERNIA REPAIR  1960's   "? side"   LUNG LOBECTOMY     right, lower; "for lung cancer"   TONSILLECTOMY AND ADENOIDECTOMY  1950's    Allergies  Allergies  Allergen Reactions   Rosuvastatin Calcium     Failed lipitor and Zocor due to muscle aches   Statins     Failed lipitor and Zocor due to muscle aches    Sulfasalazine Other (See Comments)    Patient "draws up" muscularly   Sulfonamide Derivatives Other (See Comments)    Patient "drasws up" muscularly    EKGs/Labs/Other Studies Reviewed:   The following studies were reviewed today:  CT of the chest without contrast 07/28/2021  IMPRESSION: 1. Stable noncontrast chest CT, without evidence of acute findings. 2. Stable postoperative and post radiation changes in the right lung. 3. Coronary and aortic  atherosclerosis (ICD10-I70.0). Emphysema (ICD10-J43.9). Previous CABG. 4. Probable cholelithiasis  Echocardiogram 06/07/2021  IMPRESSIONS     1. Left ventricular ejection fraction, by estimation, is 40 to 45%. The  left ventricle has mildly decreased function. The left ventricle  demonstrates global hypokinesis. There is mild concentric left ventricular  hypertrophy. Left ventricular diastolic  parameters are indeterminate.   2. Right ventricular systolic function was not well visualized. The right  ventricular size is normal. Tricuspid regurgitation signal is inadequate  for assessing PA pressure.   3. The mitral valve is normal in structure. No evidence of mitral valve  regurgitation. No evidence of mitral stenosis.   4. The aortic valve is tricuspid. There is mild calcification of the  aortic valve. There is moderate thickening of the aortic valve. Aortic  valve regurgitation is not visualized. Aortic valve sclerosis is present,  with no evidence of aortic valve  stenosis.   5. The inferior vena cava is normal in size with greater than 50%  respiratory variability, suggesting right atrial pressure of 3 mmHg.   Comparison(s): LVEF has decreased since prior report (2013 study).   EKG:  EKG is not ordered today.    Recent Labs: No results found for requested labs within last 365 days.  Recent Lipid Panel    Component Value Date/Time   CHOL 201 (H) 03/27/2019 1329   TRIG 287 (H) 03/27/2019 1329   HDL 60 03/27/2019 1329    CHOLHDL 3.4 03/27/2019 1329   CHOLHDL 3.4 08/07/2015 0851   VLDL 37 (H) 08/07/2015 0851   LDLCALC 93 03/27/2019 1329   LDLDIRECT 175 (H) 12/24/2018 1211   LDLDIRECT 72.0 03/31/2014 0850     Home Medications   Current Meds  Medication Sig   albuterol (PROAIR HFA) 108 (90 Base) MCG/ACT inhaler Inhale 1-2 puffs into the lungs every 6 (six) hours as needed for wheezing or shortness of breath.   Alirocumab (PRALUENT) 75 MG/ML SOAJ Inject 1 mL (75 mg total) into the skin every 14 (fourteen) days.   apixaban (ELIQUIS) 5 MG TABS tablet Take 1 tablet (5 mg total) by mouth 2 (two) times daily.   BUDESONIDE IN Inhale into the lungs.   cholecalciferol (VITAMIN D3) 25 MCG (1000 UNIT) tablet Take by mouth.   clotrimazole-betamethasone (LOTRISONE) cream Apply 1 application. topically daily as needed (itchy ear). Apply inside Right ear   colestipol (COLESTID) 1 g tablet Take by mouth.   cyanocobalamin (VITAMIN B12) 500 MCG tablet Take by mouth.   fenofibrate (TRICOR) 145 MG tablet Take 1 tablet (145 mg total) by mouth daily.   fluticasone (FLONASE) 50 MCG/ACT nasal spray Place into the nose.   formoterol (PERFOROMIST) 20 MCG/2ML nebulizer solution Take 2 mLs (20 mcg total) by nebulization 2 (two) times daily. Dx:J43.2   furosemide (LASIX) 20 MG tablet Take 1 tablet (20 mg total) by mouth every other day.   gabapentin (NEURONTIN) 400 MG capsule Take 400 mg by mouth at bedtime.   guaiFENesin (MUCINEX) 600 MG 12 hr tablet Take 600 mg by mouth 2 (two) times daily as needed for to loosen phlegm. For congestion   HYDROcodone-acetaminophen (NORCO) 7.5-325 MG tablet Take 1 tablet by mouth every 6 (six) hours as needed for moderate pain.   levothyroxine (SYNTHROID, LEVOTHROID) 137 MCG tablet Take 68.5-137 mcg by mouth See admin instructions. Take 68.5 mcg Mon Wed and Fri, 137 mcg all other days   Lidocaine HCl 4 % CREA Apply 1 application. topically as needed (pain).   loratadine (CLARITIN) 10  MG tablet Take 10  mg by mouth daily as needed for allergies or rhinitis.    magnesium oxide (MAG-OX) 400 MG tablet Take 400 mg by mouth daily.   metFORMIN (GLUCOPHAGE-XR) 500 MG 24 hr tablet Take 1,000 mg by mouth daily with breakfast.   metoprolol succinate (TOPROL XL) 25 MG 24 hr tablet Take 1 tablet (25 mg total) by mouth daily.   nitroGLYCERIN (NITROSTAT) 0.4 MG SL tablet Place 1 tablet (0.4 mg total) under the tongue every 5 (five) minutes as needed for chest pain (3 doses max).   nystatin (MYCOSTATIN/NYSTOP) powder Apply topically.   omeprazole (PRILOSEC) 20 MG capsule Take 1 capsule by mouth daily as needed.   predniSONE (DELTASONE) 10 MG tablet Take 10 mg by mouth daily.   Propylene Glycol (SYSTANE BALANCE) 0.6 % SOLN Place 1 drop into both eyes daily.   tamsulosin (FLOMAX) 0.4 MG CAPS capsule Take 0.4 mg by mouth daily after supper.    [DISCONTINUED] lisinopril (ZESTRIL) 5 MG tablet Take 5 mg by mouth daily.     Review of Systems      All other systems reviewed and are otherwise negative except as noted above.  Physical Exam    VS:  BP 128/68   Pulse 91   Ht 5\' 4"  (1.626 m)   Wt 208 lb (94.3 kg)   SpO2 94% Comment: On 3L of o2  BMI 35.70 kg/m  , BMI Body mass index is 35.7 kg/m.  Wt Readings from Last 3 Encounters:  09/01/22 208 lb (94.3 kg)  08/01/22 215 lb 6.4 oz (97.7 kg)  01/27/22 219 lb (99.3 kg)     GEN: Well nourished, well developed, in no acute distress. HEENT: normal. Neck: Supple, no JVD, carotid bruits, or masses. Cardiac: RRR, no murmurs, rubs, or gallops. No clubbing, cyanosis, edema.  Radials/PT 2+ and equal bilaterally.  Respiratory:  Respirations regular and unlabored, clear to auscultation bilaterally. GI: Soft, nontender, nondistended. MS: No deformity or atrophy. Skin: Warm and dry, no rash. Neuro:  Strength and sensation are intact. Psych: Normal affect.  Assessment & Plan    CAD s/p CABG 2013 -Continue GDMT: Eliquis 5 mg twice a day, lisinopril 5 mg daily,  metoprolol 25 mg twice a day -Wants to come off Eliquis but will need to discuss with Dr. Eden Emms -CABG 2013 nonischemic Myoview 10/30/2017 with EF 65% no wall motion abnormalities  PAF -No recurrence since cardioversion, remains on Eliquis -Normal sinus rhythm today on exam  HLD -Triglycerides high (greater than 300) -Start fenofibrate Recheck-in 3 months  DM -Last A1c 6.6 -Surveillance per PCP  Lung CA -Status post lung resection  Hypothyroidism -Continue Synthroid  RBBB -Thought to be due to lung disease f/u EKG yearly -stable today   Disposition: Follow up 1 year with [name], MD or APP.  Signed, Sharlene Dory, PA-C 09/01/2022, 5:35 PM [facility]

## 2022-09-01 NOTE — Patient Instructions (Signed)
Medication Instructions:  Stop Lisinopril  Start Fenofibrate 145 mg daily    *If you need a refill on your cardiac medications before your next appointment, please call your pharmacy*   Lab Work: None ordered   If you have labs (blood work) drawn today and your tests are completely normal, you will receive your results only by: MyChart Message (if you have MyChart) OR A paper copy in the mail If you have any lab test that is abnormal or we need to change your treatment, we will call you to review the results.   Testing/Procedures: none   Follow-Up: At [facility], you and your health needs are our priority.  As part of our continuing mission to provide you with exceptional heart care, we have created designated Provider Care Teams.  These Care Teams include your primary Cardiologist (physician) and Advanced Practice Providers (APPs -  Physician Assistants and Nurse Practitioners) who all work together to provide you with the care you need, when you need it.  We recommend signing up for the patient portal called "MyChart".  Sign up information is provided on this After Visit Summary.  MyChart is used to connect with patients for Virtual Visits (Telemedicine).  Patients are able to view lab/test results, encounter notes, upcoming appointments, etc.  Non-urgent messages can be sent to your provider as well.   To learn more about what you can do with MyChart, go to [email or web address].    Your next appointment:   12 month(s)  Provider:   [name], MD     Other Instructions Check your blood pressure daily for 2 weeks and call us with your readings

## 2022-09-15 ENCOUNTER — Telehealth: Payer: Self-pay | Admitting: Cardiovascular Disease

## 2022-09-15 NOTE — Telephone Encounter (Signed)
Patient reports the follow blood pressures and heart rates: 140/69, 86 127/64, 83 142/65, 94 140/69, 76 131/69, 94 128/63, 73 142/72, 75 129/57, 90 134/66, 83  Also advised patient of message from [place] about Eliquis: Sharlene Dory, PA-C Mr. Robel,   I discussed your Eliquis with Dr. Eden Emms.  Even though you have been in normal sinus rhythm your risk for stroke is still considered high.  He would like you to continue your Eliquis 5 mg twice a day to help prevent the risk of stroke.  It is possible you still are having some rate controlled atrial fibrillation as well that you do not feel and are asymptomatic from.   If you have questions please let me know.   Sharlene Dory, PA-C

## 2022-09-15 NOTE — Telephone Encounter (Signed)
Spoke with the patient and gave recommendations from [place]. Patient verbalized understanding.

## 2022-09-15 NOTE — Telephone Encounter (Signed)
Patient is requesting to speak with the nurse in regards to his BP readings and his Eliquis medication. Please advise.

## 2022-12-26 ENCOUNTER — Other Ambulatory Visit: Payer: Self-pay | Admitting: Cardiovascular Disease

## 2023-01-17 ENCOUNTER — Telehealth: Payer: Self-pay | Admitting: *Deleted

## 2023-01-17 NOTE — Telephone Encounter (Signed)
CALLED PATIENT TO INFORM OF CT FOR 01-30-23- ARRIVAL TIME- 10:45 AM @ [facility], NO RESTRICTIONS TO SCAN, PATIENT TO RECEIVE RESULTS FROM DR. [name] ON  02-06-23 @ 11:45 AM, LVM FOR A RETURN CALL

## 2023-01-24 ENCOUNTER — Telehealth: Payer: Self-pay | Admitting: *Deleted

## 2023-01-24 NOTE — Telephone Encounter (Signed)
   Pre-operative Risk Assessment    Patient Name: William Dyer  DOB: March 12, 1945 MRN: [id number]  DATE OF LAST VISIT: 09/01/22 TESSA CONTE, PAC DATE OF NEXT VISIT: NONE    Request for Surgical Clearance    Procedure:   COLONOSCOPY  Date of Surgery:  Clearance TBD                                 Surgeon:  DR. BADREDDINE Surgeon's Group or Practice Name:  [facility] Phone number:  [phone] Fax number:  [phone]   Type of Clearance Requested:   - Medical  - Pharmacy:  Hold Apixaban (Eliquis) x 2 DAYS PRIOR: ALSO WILL NEED CLEARANCE TO INDICATE OF PT IS LOW RISK, MEDIUM RISK OR HIGH RISK   Type of Anesthesia:  Not Indicated   Additional requests/questions:    [name]   01/24/2023, 11:37 AM

## 2023-01-26 ENCOUNTER — Telehealth: Payer: Self-pay | Admitting: Pharmacy Technician

## 2023-01-26 ENCOUNTER — Other Ambulatory Visit (HOSPITAL_COMMUNITY): Payer: Self-pay

## 2023-01-26 ENCOUNTER — Telehealth: Payer: Self-pay | Admitting: Cardiovascular Disease

## 2023-01-26 MED ORDER — REPATHA SURECLICK 140 MG/ML ~~LOC~~ SOAJ: 1.0000 mL | SUBCUTANEOUS | 11 refills | Status: AC

## 2023-01-26 NOTE — Telephone Encounter (Signed)
Next year his insurance wont cover praluent. Can we go ahead and get Reaptha approved?

## 2023-01-26 NOTE — Telephone Encounter (Signed)
   Name: William Dyer  DOB: [birth year]  MRN: [id number]  Primary Cardiologist: [name], MD  Chart reviewed as part of pre-operative protocol coverage. Because of William Dyer's past medical history and time since last visit, he will require a follow-up in-office visit in order to better assess preoperative cardiovascular risk.  Requesting provider would like to know if patient is low risk, medium risk or high risk for upcoming procedure.  In office visit is preferred for evaluation.  Pre-op covering staff: - Please schedule appointment and call patient to inform them. If patient already had an upcoming appointment within acceptable timeframe, please add "pre-op clearance" to the appointment notes so provider is aware. - Please contact requesting surgeon's office via preferred method (i.e, phone, fax) to inform them of need for appointment prior to surgery.  Per office protocol, patient can hold Eliquis for 2 days prior to procedure.    William Dyer, William Rains, NP  01/26/2023, 2:32 PM

## 2023-01-26 NOTE — Telephone Encounter (Signed)
Patient made aware that Repatha was approved and explained the change to Repatha per insurance.

## 2023-01-26 NOTE — Telephone Encounter (Signed)
Pharmacy Patient Advocate Encounter  Received notification from [facility] that Prior Authorization for praluent has been APPROVED from 01/26/23 to 02/07/24   PA #/Case ID/Reference #: [id number]

## 2023-01-26 NOTE — Telephone Encounter (Signed)
Pharmacy Patient Advocate Encounter  Received notification from [facility] that Prior Authorization for repatha has been APPROVED from 01/26/23 to 07/27/23   PA #/Case ID/Reference #: [id number]

## 2023-01-26 NOTE — Telephone Encounter (Signed)
Pt c/o medication issue:  1. Name of Medication: Alirocumab (PRALUENT) 75 MG/ML SOAJ   2. How are you currently taking this medication (dosage and times per day)? N/A  3. Are you having a reaction (difficulty breathing--STAT)? N/A  4. What is your medication issue? Shari with [employer] called at 1:53 pm yesterday and spoke with after hours. Per documentation this medication will no longer be covered as of 02/08/23. The alternative Rx will be Repatha. Please advise.

## 2023-01-26 NOTE — Telephone Encounter (Signed)
Pharmacy Patient Advocate Encounter   Received notification from Pt Calls Messages that prior authorization for praluent is required/requested.   Insurance verification completed.   The patient is insured through [facility] .   Per test claim: PA required; PA submitted to above mentioned insurance via CoverMyMeds Key/confirmation #/EOC [id number] Status is pending

## 2023-01-26 NOTE — Telephone Encounter (Signed)
Patient scheduled to see Robin Searing, NP on 02/21/23 for preop clearance

## 2023-01-26 NOTE — Telephone Encounter (Signed)
Patient with diagnosis of afib on Eliquis for anticoagulation.    Procedure: colonoscopy Date of procedure: TBD   CHA2DS2-VASc Score = 5   This indicates a 7.2% annual risk of stroke. The patient's score is based upon: CHF History: 0 HTN History: 1 Diabetes History: 1 Stroke History: 0 Vascular Disease History: 1 Age Score: 2 Gender Score: 0      CrCl 62 ml/min  Per office protocol, patient can hold Eliquis for 2 days prior to procedure.    **This guidance is not considered finalized until pre-operative APP has relayed final recommendations.**

## 2023-01-26 NOTE — Addendum Note (Signed)
Addended by: Malena Peer D on: 01/26/2023 02:34 PM   Modules accepted: Orders

## 2023-01-26 NOTE — Telephone Encounter (Signed)
Pharmacy Patient Advocate Encounter   Received notification from Patient Advice Request messages that prior authorization for repatha is required/requested.   Insurance verification completed.   The patient is insured through [facility] .   Per test claim: PA required; PA submitted to above mentioned insurance via CoverMyMeds Key/confirmation #/EOC [id number] Status is pending

## 2023-01-30 ENCOUNTER — Ambulatory Visit (HOSPITAL_COMMUNITY)
Admission: RE | Admit: 2023-01-30 | Discharge: 2023-01-30 | Disposition: A | Discharge status: Home / Self Care | Payer: Medicare Other | Source: Ambulatory Visit | Attending: Radiation Oncology | Admitting: Radiation Oncology

## 2023-01-30 DIAGNOSIS — C3411 Malignant neoplasm of upper lobe, right bronchus or lung: Secondary | ICD-10-CM | POA: Insufficient documentation

## 2023-02-05 NOTE — Progress Notes (Signed)
Radiation Oncology         [phone] ________________________________  Name: William Dyer MRN: [id number]  Date: 02/06/2023  DOB: [birth year]  Follow-Up Visit Note  CC: Genevive Bi, PA-C  Dan Maker., MD    ICD-10-CM   1. Solitary pulmonary nodule [R91.1]  R91.1       Diagnosis: Presumptive stage I bronchogenic neoplasm presenting in the medial right upper lobe (1.4 cm)   Interval Since Last Radiation: 3 years, 10 months, and 21 days  Treatment Dates: 03/12/2019 through 03/19/2019 Site Technique Total Dose (Gy) Dose per Fx (Gy) Completed Fx Beam Energies  Lung, Right: Lung_Rt IMRT 54/54 18 3/3 6XFFF    Narrative:  The patient returns today for routine follow-up and to review recent imaging. He was last seen here for follow-up on 08/01/22.     Since his last visit, he was seen in the [facility] ED on 08/13/22 for management of COPD exacerbation. (Encounter details are limited at this time).     His most recent chest CT on 01/30/23 showed stable exam findings and no evidence of local or metastatic disease.    Otherwise, no other significant oncologic interval history since the patient was last seen here for follow-up.   Patient reports a 1 week history of right-sided back and shoulder pain.  He first noticed this pain after he could not get off the CT table and was pulled by the tech to help get him off.  He denies any other trauma to this area he rates the pain a 9 out of 10 and has been taking extra strength Tylenol to help alleviate this.  Pain worsens with inspiration and raising his right arm. He experienced some tingling in his right hand a couple days ago, but denies any numbness or weakness in his right upper extremity. He denies any worsening shortness of breath or chest pain.  He continues to wear 2 L of oxygen at home, he denies any cough, hemoptysis, dysphagia, or odynophagia.                       Allergies:  is allergic to rosuvastatin calcium, statins,  sulfasalazine, and sulfonamide derivatives.  Meds: Current Outpatient Medications  Medication Sig Dispense Refill   albuterol (PROAIR HFA) 108 (90 Base) MCG/ACT inhaler Inhale 1-2 puffs into the lungs every 6 (six) hours as needed for wheezing or shortness of breath. 16 g 0   apixaban (ELIQUIS) 5 MG TABS tablet Take 1 tablet (5 mg total) by mouth 2 (two) times daily. 60 tablet 5   BUDESONIDE IN Inhale into the lungs.     cholecalciferol (VITAMIN D3) 25 MCG (1000 UNIT) tablet Take by mouth.     clotrimazole-betamethasone (LOTRISONE) cream Apply 1 application. topically daily as needed (itchy ear). Apply inside Right ear     colestipol (COLESTID) 1 g tablet Take by mouth.     cyanocobalamin (VITAMIN B12) 500 MCG tablet Take by mouth.     Evolocumab (REPATHA SURECLICK) 140 MG/ML SOAJ Inject 140 mg into the skin every 14 (fourteen) days. 2 mL 11   fenofibrate (TRICOR) 145 MG tablet Take 1 tablet (145 mg total) by mouth daily. 90 tablet 3   fluticasone (FLONASE) 50 MCG/ACT nasal spray Place into the nose.     formoterol (PERFOROMIST) 20 MCG/2ML nebulizer solution Take 2 mLs (20 mcg total) by nebulization 2 (two) times daily. Dx:J43.2 120 mL 0   furosemide (LASIX) 20 MG tablet Take 1  tablet (20 mg total) by mouth every other day. 15 tablet 12   gabapentin (NEURONTIN) 400 MG capsule Take 400 mg by mouth at bedtime.     guaiFENesin (MUCINEX) 600 MG 12 hr tablet Take 600 mg by mouth 2 (two) times daily as needed for to loosen phlegm. For congestion     HYDROcodone-acetaminophen (NORCO) 7.5-325 MG tablet Take 1 tablet by mouth every 6 (six) hours as needed for moderate pain.     levothyroxine (SYNTHROID, LEVOTHROID) 137 MCG tablet Take 68.5-137 mcg by mouth See admin instructions. Take 68.5 mcg Mon Wed and Fri, 137 mcg all other days     Lidocaine HCl 4 % CREA Apply 1 application. topically as needed (pain).     loratadine (CLARITIN) 10 MG tablet Take 10 mg by mouth daily as needed for allergies or  rhinitis.      magnesium oxide (MAG-OX) 400 MG tablet Take 400 mg by mouth daily.     metFORMIN (GLUCOPHAGE-XR) 500 MG 24 hr tablet Take 1,000 mg by mouth daily with breakfast.     metoprolol succinate (TOPROL-XL) 25 MG 24 hr tablet Take 1 tablet by mouth once daily 90 tablet 2   nitroGLYCERIN (NITROSTAT) 0.4 MG SL tablet Place 1 tablet (0.4 mg total) under the tongue every 5 (five) minutes as needed for chest pain (3 doses max). 25 tablet 3   omeprazole (PRILOSEC) 20 MG capsule Take 1 capsule by mouth daily as needed.     predniSONE (DELTASONE) 10 MG tablet Take 10 mg by mouth daily. (Patient not taking: Reported on 02/06/2023)     Propylene Glycol (SYSTANE BALANCE) 0.6 % SOLN Place 1 drop into both eyes daily.     tamsulosin (FLOMAX) 0.4 MG CAPS capsule Take 0.4 mg by mouth daily after supper.      No current facility-administered medications for this encounter.    Physical Findings: The patient is in no acute distress. Patient is alert and oriented.  height is 5' 3.5" (1.613 m) and weight is 212 lb 6.4 oz (96.3 kg). His temperature is 97.4 F (36.3 C) (abnormal). His blood pressure is 161/75 (abnormal) and his pulse is 70. His respiration is 24 (abnormal) and oxygen saturation is 96%. .  No significant changes. Lungs are clear to auscultation bilaterally. Heart has regular rate and rhythm. 5/5 bilateral grip strength. Intact right radial pulse.  Tenderness to palpation inferior to the right shoulder blade. No palpable cervical, supraclavicular, or axillary adenopathy. Abdomen soft, non-tender, normal bowel sounds.   Lab Findings: Lab Results  Component Value Date   WBC 16.2 (H) 05/15/2013   HGB 13.9 06/24/2021   HCT 41.0 06/24/2021   MCV 92.7 05/15/2013   PLT 223 05/15/2013    Radiographic Findings: CT CHEST WO CONTRAST Result Date: 02/03/2023 CLINICAL DATA:  Non-small cell lung cancer (NSCLC), non-metastatic, assess treatment response * Tracking Code: BO *. EXAM: CT CHEST WITHOUT  CONTRAST TECHNIQUE: Multidetector CT imaging of the chest was performed following the standard protocol without IV contrast. RADIATION DOSE REDUCTION: This exam was performed according to the departmental dose-optimization program which includes automated exposure control, adjustment of the mA and/or kV according to patient size and/or use of iterative reconstruction technique. COMPARISON:  CT scan chest from 07/29/2022. FINDINGS: Cardiovascular: Normal cardiac size. No pericardial effusion. No aortic aneurysm. There are coronary artery calcifications, in keeping with coronary artery disease. There are also moderate to severe peripheral atherosclerotic vascular calcifications of thoracic aorta and its major branches. Status post CABG. Mediastinum/Nodes:  Visualized thyroid gland appears grossly unremarkable. No solid / cystic mediastinal masses. The esophagus is nondistended precluding optimal assessment. There is mild circumferential thickening of the lower thoracic esophagus, which is most likely seen in the settings of chronic gastroesophageal reflux disease versus esophagitis. No mediastinal or axillary lymphadenopathy by size criteria. Evaluation of bilateral hila is limited due to lack on intravenous contrast: however, no large hilar lymphadenopathy identified. Lungs/Pleura: The central tracheo-bronchial tree is patent. Redemonstration of post surgical suture in the right lung lower lobe, from prior wedge resection. There is a stable, irregular, triangular opacity in the right superior hilar region with associated bronchiectasis, reaching up to the pleural surface, favored to represent posttreatment changes. No new mass or consolidation. No pleural effusion or pneumothorax. Redemonstration of upper lobe predominant moderate emphysematous changes. There is a stable sub 5 mm solid noncalcified nodule in the left lung lower lobe (series 6, image 115). No new or suspicious lung nodule. Upper Abdomen: Surgically  absent gallbladder. There are scattered colonic diverticula without CT signs of diverticulitis. Remaining visualized upper abdominal viscera within normal limits. Musculoskeletal: The visualized soft tissues of the chest wall are grossly unremarkable. No suspicious osseous lesions. There are mild multilevel degenerative changes in the visualized spine. IMPRESSION: *Essentially stable exam. No local or metastatic disease seen in the chest. *Multiple other nonacute observations, as described above. Aortic Atherosclerosis (ICD10-I70.0) and Emphysema (ICD10-J43.9). Electronically Signed   By: Jules Schick M.D.   On: 02/03/2023 13:51    Impression: Presumptive stage I bronchogenic neoplasm presenting in the medial right upper lobe (1.4 cm)   We reviewed the patient's most recent CT scan.  Imaging demonstrates no evidence of disease recurrence or metastasis.  His back and shoulder pain seem to be of musculoskeletal etiology.  Patient denied orthopedic referral today.  He states he would like to talk to his pulmonologist, who he sees later today, prior to seeing another doctor.  Knows to call back to our office if he would like that referral to be made.  Plan:  Follow up chest CT in 6 months. Routine office visit 1-2 days after scan to review results. Patient knows to call with any questions or concerns in the meantime.    20 minutes of total time was spent for this patient encounter, including preparation, face-to-face counseling with the patient and coordination of care, physical exam, and documentation of the encounter. ____________________________________   Bryan Lemma, PA-C  This document serves as a record of services personally performed by Bryan Lemma, PA-C. It was created on his behalf by Neena Rhymes, a trained medical scribe. The creation of this record is based on the scribe's personal observations and the provider's statements to them. This document has been checked and approved by the  attending provider.

## 2023-02-06 ENCOUNTER — Ambulatory Visit
Admission: RE | Admit: 2023-02-06 | Discharge: 2023-02-06 | Disposition: A | Discharge status: Home / Self Care | Payer: Medicare Other | Source: Ambulatory Visit | Attending: Radiation Oncology | Admitting: Radiation Oncology

## 2023-02-06 ENCOUNTER — Other Ambulatory Visit: Payer: Self-pay

## 2023-02-06 ENCOUNTER — Encounter: Payer: Self-pay | Admitting: Radiation Oncology

## 2023-02-06 VITALS — BP 161/75 | HR 70 | Temp 97.4°F | Resp 24 | Ht 63.5 in | Wt 212.4 lb

## 2023-02-06 DIAGNOSIS — I2510 Atherosclerotic heart disease of native coronary artery without angina pectoris: Secondary | ICD-10-CM | POA: Diagnosis not present

## 2023-02-06 DIAGNOSIS — M25519 Pain in unspecified shoulder: Secondary | ICD-10-CM | POA: Diagnosis not present

## 2023-02-06 DIAGNOSIS — Z923 Personal history of irradiation: Secondary | ICD-10-CM | POA: Insufficient documentation

## 2023-02-06 DIAGNOSIS — Z7984 Long term (current) use of oral hypoglycemic drugs: Secondary | ICD-10-CM | POA: Insufficient documentation

## 2023-02-06 DIAGNOSIS — Z7952 Long term (current) use of systemic steroids: Secondary | ICD-10-CM | POA: Insufficient documentation

## 2023-02-06 DIAGNOSIS — Z7901 Long term (current) use of anticoagulants: Secondary | ICD-10-CM | POA: Diagnosis not present

## 2023-02-06 DIAGNOSIS — R918 Other nonspecific abnormal finding of lung field: Secondary | ICD-10-CM | POA: Diagnosis present

## 2023-02-06 DIAGNOSIS — Z79899 Other long term (current) drug therapy: Secondary | ICD-10-CM | POA: Insufficient documentation

## 2023-02-06 DIAGNOSIS — I700 Atherosclerosis of aorta: Secondary | ICD-10-CM | POA: Diagnosis not present

## 2023-02-06 DIAGNOSIS — Z9049 Acquired absence of other specified parts of digestive tract: Secondary | ICD-10-CM | POA: Insufficient documentation

## 2023-02-06 DIAGNOSIS — J449 Chronic obstructive pulmonary disease, unspecified: Secondary | ICD-10-CM | POA: Insufficient documentation

## 2023-02-06 DIAGNOSIS — R911 Solitary pulmonary nodule: Secondary | ICD-10-CM

## 2023-02-06 DIAGNOSIS — I4892 Unspecified atrial flutter: Secondary | ICD-10-CM

## 2023-02-06 MED ORDER — APIXABAN 5 MG PO TABS
5.0000 mg | ORAL_TABLET | Freq: Two times a day (BID) | ORAL | 5 refills | Status: AC
Start: 2023-02-06 — End: ?

## 2023-02-06 NOTE — Progress Notes (Signed)
William Dyer is here today for follow up post radiation to the lung.  Lung Side: Right, patient completed treatment on 03/19/19  Does the patient complain of any of the following: Pain: Yes, patient reports having pain to right  back under shoulder blade. Patient rates pain 9/10. Pain is eased with extra strength tylenol. Patient reports pain started 7 days ago. Shortness of breath w/wo exertion: Yes. Patient continues to wear oxygen at 2 L via [place].  Cough: No Hemoptysis: No Pain with swallowing: No Swallowing/choking concerns: No Appetite: Good Energy Level: Fair Post radiation skin Changes: No    Additional comments if applicable:   BP (!) 161/75 (BP Location: Left Arm, Patient Position: Sitting, Cuff Size: Large)   Pulse 70   Temp (!) 97.4 F (36.3 C)   Resp (!) 24   Ht 5' 3.5" (1.613 m)   Wt 212 lb 6.4 oz (96.3 kg)   SpO2 96%   PF (!) 2 L/min   BMI 37.03 kg/m

## 2023-02-06 NOTE — Telephone Encounter (Signed)
Prescription refill request for Eliquis received. Indication:afib Last office visit:7/24 Scr:1.01  8/24 Age: 78 Weight:94.3  kg  Prescription refilled

## 2023-02-19 NOTE — Progress Notes (Addendum)
 Cardiology Office Note    Patient Name: William Dyer Date of Encounter: 02/19/2023  Primary Care Provider:  Ashley Eleanor Jacob, PA-C Primary Cardiologist:  Maude Emmer, MD Primary Electrophysiologist: None   Past Medical History    Past Medical History:  Diagnosis Date   CAD (coronary artery disease)    Remote PCI; s/p CABG x 3 in Feb 2013   COPD (chronic obstructive pulmonary disease) (HCC)    Diabetes mellitus    GERD (gastroesophageal reflux disease)    Heart murmur    History of radiation therapy 03/19/2019   IMRT right lung 03/12/2019-03/19/2019  Dr Lynwood Nasuti   Hyperlipidemia    Hypothyroidism    Lung cancer (HCC) 07/08/2000   status post right lower lobectomy for adenocarcinoma   Obesity    Pneumonia     History of Present Illness  William Dyer is a 78 y.o. male with a PMH of CAD s/p CABG x 3 (SVG to D1, SVG to RCA and LIMA to LAD) 03/2011, HTN, HLD,Hypothyroid, RBBB, DM type II, COPD, Lung adenocarcinoma s/p partial resection who presents today for preoperative clearance.  [name] has been followed by Dr. Emmer since 2013 for management of CAD. He underwent previous angioplasty by Dr. Morris in 1991 and completed a stress test for complaint of chest pain in 2013 that was abnormal and LHC was completed that showed 3 vessel disease. He underwent a CABG x 3 in 03/2011 with (SVG to D1, SVG to RCA and LIMA to LAD). Last Myoview  was done 10/30/17 EF 65% diaphragmatic attenuation with no ischemia low risk study. He was diagnosed with AF in 05/2021 and was started on [facility] with Eliquis  and rate control with Cardizem . TTE was completed that showed decreased EF of 40-45% and cardizem  was changed to Metoprolol  and he was scheduled for a DCCV that was successful on 06/24/2021. He was seen in follow up on 08/31/21 by Orren Fabry, PA and reported ongoing CP and underwent stress test that was normal.  During today's visit the patient reports that he has been experiencing ongoing chest  discomfort, which they describe as possibly musculoskeletal or related to indigestion. The discomfort is intermittent, sometimes radiating upwards, but does not seem to be associated with exertion or specific triggers.  He reports compliance with current medication regimen and denies any adverse reactions.  His blood pressure today is elevated 154/84 and is controlled at home. He denies any associated shortness of breath, nausea, or diaphoresis. They have had a stress test in the past, which was normal.  He is scheduled to undergo a colonoscopy procedure due to a history of loose stools, which have improved with Cholestyramine. They describe having a solid bowel movement in the morning, followed by a looser stool about 30 minutes later. This pattern is consistent and does not seem to be associated with specific foods or medications.   Review of Systems  Please see the history of present illness.    All other systems reviewed and are otherwise negative except as noted above.  Physical Exam    Wt Readings from Last 3 Encounters:  02/06/23 212 lb 6.4 oz (96.3 kg)  09/01/22 208 lb (94.3 kg)  08/01/22 215 lb 6.4 oz (97.7 kg)   CD:Uyzmz were no vitals filed for this visit.,There is no height or weight on file to calculate BMI. GEN: Well nourished, well developed in no acute distress Neck: No JVD; No carotid bruits Pulmonary: Clear to auscultation without rales, wheezing or rhonchi  Cardiovascular:  Normal rate. Regular rhythm. Normal S1. Normal S2.   Murmurs: There is no murmur.  ABDOMEN: Soft, non-tender, non-distended EXTREMITIES:  No edema; No deformity   EKG/LABS/ Recent Cardiac Studies   ECG personally reviewed by me today -sinus rhythm with RBBB and rate of 70 bpm with no acute changes consistent with previous EKG.  Risk Assessment/Calculations:    CHA2DS2-VASc Score = 5   This indicates a 7.2% annual risk of stroke. The patient's score is based upon: CHF History: 0 HTN History:  1 Diabetes History: 1 Stroke History: 0 Vascular Disease History: 1 Age Score: 2 Gender Score: 0         Lab Results  Component Value Date   WBC 16.2 (H) 05/15/2013   HGB 13.9 06/24/2021   HCT 41.0 06/24/2021   MCV 92.7 05/15/2013   PLT 223 05/15/2013   Lab Results  Component Value Date   CREATININE 1.20 06/24/2021   BUN 16 06/24/2021   NA 137 06/24/2021   K 3.9 06/24/2021   CL 97 (L) 06/24/2021   CO2 22 04/11/2016   Lab Results  Component Value Date   CHOL 201 (H) 03/27/2019   HDL 60 03/27/2019   LDLCALC 93 03/27/2019   LDLDIRECT 175 (H) 12/24/2018   TRIG 287 (H) 03/27/2019   CHOLHDL 3.4 03/27/2019    Lab Results  Component Value Date   HGBA1C 6.6 (H) 05/13/2013   Assessment & Plan    1.  Preoperative Clearance: -Patient's RCRI score is 0.9% The patient affirms he has been doing well without any new cardiac symptoms. They are able to achieve 6 METS without cardiac limitations. Therefore, based on ACC/AHA guidelines, the patient would be at acceptable risk for the planned procedure without further cardiovascular testing. The patient was advised that if he develops new symptoms prior to surgery to contact our office to arrange for a follow-up visit, and he verbalized understanding.   Patient advised to hold Eliquis  2 days prior to procedure and resume postprocedure when surgically safe and hemostasis is achieved.  2.PAF: -Stable on Eliquis  and Metoprolol  25mg  daily. No active bleeding, but reports bruising. -Continue Eliquis  and Metoprolol .  3.CAD: -s/p CABG x 3 in 2013 with Myoview  10/30/2017 with EF 65% no wall motion abnormalities  -Reports intermittent chest pain, possibly musculoskeletal or reflux related. Stress test in July 2023 was normal. -Continue monitoring symptoms.  4.Essential HTN: -Blood pressure today was mildly elevated 134/84 and reports controlled at home. -Continue Toprol -XL 25 mg daily  5.Hyperlipidemia On Praluent  injections every 14  days. Pharmacy suggested switch to Repatha . -Try Repatha  while still having Praluent  on hand. If adverse effects occur, discontinue Repatha  and notify provider.  Disposition: Follow-up with Maude Emmer, MD or APP in 6 months    Signed, Wyn Raddle, Jackee Shove, NP 02/19/2023, 5:15 PM [facility]

## 2023-02-21 ENCOUNTER — Ambulatory Visit: Payer: Medicare Other | Attending: Nurse Practitioner | Admitting: Nurse Practitioner

## 2023-02-21 ENCOUNTER — Encounter: Payer: Self-pay | Admitting: Nurse Practitioner

## 2023-02-21 VITALS — BP 154/84 | HR 85 | Ht 60.35 in | Wt 209.4 lb

## 2023-02-21 DIAGNOSIS — Z01810 Encounter for preprocedural cardiovascular examination: Secondary | ICD-10-CM | POA: Diagnosis not present

## 2023-02-21 DIAGNOSIS — E785 Hyperlipidemia, unspecified: Secondary | ICD-10-CM

## 2023-02-21 DIAGNOSIS — I10 Essential (primary) hypertension: Secondary | ICD-10-CM | POA: Diagnosis not present

## 2023-02-21 DIAGNOSIS — I2510 Atherosclerotic heart disease of native coronary artery without angina pectoris: Secondary | ICD-10-CM | POA: Diagnosis not present

## 2023-02-21 DIAGNOSIS — I480 Paroxysmal atrial fibrillation: Secondary | ICD-10-CM | POA: Diagnosis not present

## 2023-02-21 NOTE — Patient Instructions (Signed)
 Medication Instructions:   Your physician recommends that you continue on your current medications as directed. Please refer to the Current Medication list given to you today.  *If you need a refill on your cardiac medications before your next appointment, please call your pharmacy*   Lab Work:  NONE ORDERED  TODAY    If you have labs (blood work) drawn today and your tests are completely normal, you will receive your results only by: MyChart Message (if you have MyChart) OR A paper copy in the mail If you have any lab test that is abnormal or we need to change your treatment, we will call you to review the results.   Testing/Procedures: NONE ORDERED  TODAY      Follow-Up: At [facility], you and your health needs are our priority.  As part of our continuing mission to provide you with exceptional heart care, we have created designated Provider Care Teams.  These Care Teams include your primary Cardiologist (physician) and Advanced Practice Providers (APPs -  Physician Assistants and Nurse Practitioners) who all work together to provide you with the care you need, when you need it.  We recommend signing up for the patient portal called MyChart.  Sign up information is provided on this After Visit Summary.  MyChart is used to connect with patients for Virtual Visits (Telemedicine).  Patients are able to view lab/test results, encounter notes, upcoming appointments, etc.  Non-urgent messages can be sent to your provider as well.   To learn more about what you can do with MyChart, go to [email or web address].    Your next appointment:  NEXT AVAILABLE IN MAY PER DR DELFORD NURSE     Provider:   Maude Delford, MD     Other Instructions

## 2023-04-27 ENCOUNTER — Ambulatory Visit: Payer: Medicare Other | Admitting: Pulmonary Disease

## 2023-05-09 ENCOUNTER — Ambulatory Visit: Payer: Medicare Other | Admitting: Physician Assistant

## 2023-06-14 ENCOUNTER — Ambulatory Visit: Payer: Medicare Other | Admitting: Cardiovascular Disease

## 2023-07-17 ENCOUNTER — Ambulatory Visit: Payer: Medicare Other | Admitting: Cardiovascular Disease

## 2023-07-20 ENCOUNTER — Telehealth: Payer: Self-pay | Admitting: *Deleted

## 2023-07-20 NOTE — Telephone Encounter (Signed)
 CALLED PATIENT TO INFORM OF CT FOR 08-07-23- ARRIVAL TIME- 1:30 PM @ [facility], NO RESTRICTIONS TO SCAN, PATIENT TO RECEIVE RESULTS FROM DR. [name] ON 08-14-23 @ 11:30 AM , LVM FOR A RETURN CALL

## 2023-07-21 ENCOUNTER — Telehealth: Payer: Self-pay | Admitting: *Deleted

## 2023-07-21 NOTE — Telephone Encounter (Signed)
 CALLED PATIENT TO INFORM OF CT BEING MOVED TO 08-08-23- ARRIVAL TIME- 3:15 PM @ [facility], NO RESTRICTIONS TO SCAN, PATIENT TO RECEIVE RESULTS FROM DR. [name] ON 08-14-23 @ 11:30 AM, LVM FOR A RETURN CALL

## 2023-08-07 ENCOUNTER — Ambulatory Visit (HOSPITAL_COMMUNITY)

## 2023-08-08 ENCOUNTER — Ambulatory Visit (HOSPITAL_COMMUNITY)

## 2023-08-09 ENCOUNTER — Telehealth: Payer: Self-pay | Admitting: *Deleted

## 2023-08-09 NOTE — Telephone Encounter (Signed)
 Called patient to inform of CT for 08-22-23- arrival time- 3:15 pm @ [facility], no restrictions to scan, patient to receive results from Dr. Shannon on 08-31-23 @ 11 am, spoke with patient and he is aware of these appts. and the instructions

## 2023-08-14 ENCOUNTER — Ambulatory Visit: Payer: Self-pay | Admitting: Radiation Oncology

## 2023-08-22 ENCOUNTER — Ambulatory Visit (HOSPITAL_COMMUNITY)
Admission: RE | Admit: 2023-08-22 | Discharge: 2023-08-22 | Disposition: A | Discharge status: Home / Self Care | Source: Ambulatory Visit | Attending: Radiology | Admitting: Radiology

## 2023-08-22 DIAGNOSIS — R911 Solitary pulmonary nodule: Secondary | ICD-10-CM | POA: Diagnosis present

## 2023-08-31 ENCOUNTER — Ambulatory Visit: Admitting: Radiation Oncology

## 2023-09-03 NOTE — Progress Notes (Signed)
 Radiation Oncology         [phone] ________________________________  Name: William Dyer MRN: [id number]  Date: 09/04/2023  DOB: 1945/04/27  Follow-Up Visit Note  CC: Ashley Eleanor Jacob, PA-C  Geroge Charlie CROME., MD  No diagnosis found.  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 He was last seen here for follow-up on 02/06/23.        Since his last visit, he as continued to follow with Dr. McQuaid [facility]) for management of COPD, emphysema, and chronic respiratory failure with hypoxemia and sleep apnea. Per his most recent visit with Dr. Alaine on 07/24/23; his respiratory status has remained stable. He was noted to have some trouble using his cpap and was advised to arrange for a mask fitting session to aid with ease of use. With regards to his COPD, his current inhaler regimen works well for him.     His most recent chest CT on 08/22/23 demonstrates no evidence of recurrent or metastatic disease, and expected post-surgical and post-radiation changes in the right hemithorax.     Of note: He presented to the [facility] ED on 04/13/23 with abdominal pain that began after a colonoscopy 2 days prior. A CT AP with contrast was subsequently performed which showed evidence of focal diverticulitis is seen involving proximal transverse colon. Encounter details are otherwise limited at this time, although he did present for another CT AP with contrast on 04/25/23 in the setting of LUQ abdominal pain which showed no acute acute intra-abdominal or pelvic pathology, and evidence of colonic  diverticulosis.    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   No other significant interval history since the patient was last seen for follow-up.   ***                       Allergies:  is allergic to rosuvastatin  calcium , statins, sulfasalazine, and sulfonamide derivatives.  Meds: Current Outpatient Medications  Medication Sig Dispense Refill   apixaban  (ELIQUIS ) 5 MG TABS tablet Take 1 tablet (5 mg total) by mouth 2 (two) times daily. 60 tablet 5   BUDESONIDE  IN Inhale into the lungs.     cholecalciferol (VITAMIN D3) 25 MCG (1000 UNIT) tablet Take by mouth.     clotrimazole-betamethasone (LOTRISONE) cream Apply 1 application. topically daily as needed (itchy ear). Apply inside Right ear     cyanocobalamin (VITAMIN B12) 500 MCG tablet Take by mouth.     Evolocumab  (REPATHA  SURECLICK) 140 MG/ML SOAJ Inject 140 mg into the skin every 14 (fourteen) days. 2 mL 11   fluticasone  (FLONASE) 50 MCG/ACT nasal spray Place into the nose.     formoterol  (PERFOROMIST ) 20 MCG/2ML nebulizer solution Take 2 mLs (20 mcg total) by nebulization 2 (two) times daily. Dx:J43.2 120 mL 0   furosemide  (LASIX ) 20 MG tablet Take 1 tablet (20 mg total) by mouth every other day. 15 tablet 12  gabapentin  (NEURONTIN ) 400 MG capsule Take 400 mg by mouth at bedtime.     guaiFENesin  (MUCINEX ) 600 MG 12 hr tablet Take 600 mg by mouth 2 (two) times daily as needed for to loosen phlegm. For congestion     HYDROcodone -acetaminophen  (NORCO) 7.5-325 MG tablet Take 1 tablet by mouth every 6 (six) hours as needed for moderate pain.     levothyroxine  (SYNTHROID , LEVOTHROID) 137 MCG tablet Take 68.5-137 mcg by mouth See admin instructions. Take 68.5 mcg Mon Wed and Fri, 137 mcg all other days     Lidocaine  HCl 4 % CREA Apply 1 application.  topically as needed (pain).     loratadine  (CLARITIN ) 10 MG tablet Take 10 mg by mouth daily as needed for allergies or rhinitis.      magnesium  oxide (MAG-OX) 400 MG tablet Take 400 mg by mouth daily.     metFORMIN  (GLUCOPHAGE -XR) 500 MG 24 hr tablet Take 500 mg by mouth daily with breakfast. Pt takes 1 tablet 500 mg in the mornings, and 2 tablet 1000 mg in the evening.     metoprolol  succinate (TOPROL -XL) 25 MG 24 hr tablet Take 1 tablet by mouth once daily 90 tablet 2   nitroGLYCERIN  (NITROSTAT ) 0.4 MG SL tablet Place 1 tablet (0.4 mg total) under the tongue every 5 (five) minutes as needed for chest pain (3 doses max). 25 tablet 3   omeprazole  (PRILOSEC) 20 MG capsule Take 1 capsule by mouth daily as needed.     predniSONE  (DELTASONE ) 10 MG tablet Take 10 mg by mouth daily.     Propylene Glycol (SYSTANE BALANCE) 0.6 % SOLN Place 1 drop into both eyes daily.     tamsulosin  (FLOMAX ) 0.4 MG CAPS capsule Take 0.4 mg by mouth daily after supper.      No current facility-administered medications for this encounter.    Physical Findings: The patient is in no acute distress. Patient is alert and oriented.  vitals were not taken for this visit. .  No significant changes. Lungs are clear to auscultation bilaterally. Heart has regular rate and rhythm. No palpable cervical, supraclavicular, or axillary adenopathy. Abdomen soft, non-tender, normal bowel sounds.   Lab Findings: Lab Results  Component Value Date   WBC 16.2 (H) 05/15/2013   HGB 13.9 06/24/2021   HCT 41.0 06/24/2021   MCV 92.7 05/15/2013   PLT 223 05/15/2013    Radiographic Findings: CT CHEST WO CONTRAST Result Date: 08/27/2023 EXAM: CT CHEST WITHOUT CONTRAST 08/22/2023 04:06:00 PM TECHNIQUE: CT of the chest was performed without the administration of intravenous contrast. Multiplanar reformatted images are provided for review. Automated exposure control, iterative reconstruction, and/or weight based adjustment of the mA/kV was  utilized to reduce the radiation dose to as low as reasonably achievable. COMPARISON: 02/03/2023 CLINICAL HISTORY: Non-small cell lung cancer (NSCLC), monitor. Reason for exam: Non-small cell lung cancer (NSCLC), monitor, Solitary pulmonary nodule. 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 Thoracic aortic atherosclerosis. LYMPH NODES: No mediastinal, hilar or axillary lymphadenopathy. LUNGS AND PLEURA: Moderate centrilobular and paraseptal emphysematous changes, upper lung predominant. Status post right lower lobe wedge resection. Radiation changes to the medial right upper lobe/suprahilar region. Stable 5 mm triangular subpleural nodule in the left lower lobe (image 112), unchanged, benign. SOFT TISSUES/BONES: No acute abnormality of the bones or soft tissues. UPPER ABDOMEN: Status post cholecystectomy. Mild hepatic steatosis. 4.6 cm simple left renal cyst (image 58), benign (Bosniak 1). Abdominal atherosclerosis. IMPRESSION: 1. Postsurgical and post-radiation changes in the  right hemithorax, as above. 2. No recurrent or metastatic disease. 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  ***  Plan:  ***   *** minutes of total time was spent for this patient encounter, including preparation, face-to-face counseling with the patient and coordination of care, physical exam, and documentation of the encounter. ____________________________________  Lynwood CHARM Nasuti, PhD, MD  This document serves as a record of services personally performed by Lynwood Nasuti, MD. It was created on his behalf by Dorthy Fuse, a trained medical scribe. The creation of this record is based on the scribe's personal observations and the provider's statements to them.  This document has been checked and approved by the attending provider.

## 2023-09-04 ENCOUNTER — Encounter: Payer: Self-pay | Admitting: Radiation Oncology

## 2023-09-04 ENCOUNTER — Ambulatory Visit
Admission: RE | Admit: 2023-09-04 | Discharge: 2023-09-04 | Disposition: A | Discharge status: Home / Self Care | Source: Ambulatory Visit | Attending: Radiation Oncology | Admitting: Radiation Oncology

## 2023-09-04 DIAGNOSIS — Z79899 Other long term (current) drug therapy: Secondary | ICD-10-CM | POA: Insufficient documentation

## 2023-09-04 DIAGNOSIS — Z7901 Long term (current) use of anticoagulants: Secondary | ICD-10-CM | POA: Insufficient documentation

## 2023-09-04 DIAGNOSIS — J9611 Chronic respiratory failure with hypoxia: Secondary | ICD-10-CM | POA: Insufficient documentation

## 2023-09-04 DIAGNOSIS — Z7984 Long term (current) use of oral hypoglycemic drugs: Secondary | ICD-10-CM | POA: Diagnosis not present

## 2023-09-04 DIAGNOSIS — R1011 Right upper quadrant pain: Secondary | ICD-10-CM | POA: Insufficient documentation

## 2023-09-04 DIAGNOSIS — R918 Other nonspecific abnormal finding of lung field: Secondary | ICD-10-CM | POA: Insufficient documentation

## 2023-09-04 DIAGNOSIS — G4730 Sleep apnea, unspecified: Secondary | ICD-10-CM | POA: Insufficient documentation

## 2023-09-04 DIAGNOSIS — Z7952 Long term (current) use of systemic steroids: Secondary | ICD-10-CM | POA: Diagnosis not present

## 2023-09-04 DIAGNOSIS — C3411 Malignant neoplasm of upper lobe, right bronchus or lung: Secondary | ICD-10-CM

## 2023-09-04 NOTE — Progress Notes (Signed)
 AZARIA BARTELL is here today for follow up post radiation to the lung.  Lung Side: Right lung, patient completed treatment on 03/19/19.  Does the patient complain of any of the following: Pain: Denies pain to treatment field, reports abdominal pain, Rates 2/10.  Shortness of breath w/wo exertion: Yes, patient continues to use oxygen at 2 liters.  Cough: No Hemoptysis: No Pain with swallowing: Yes  Swallowing/choking concerns: Yes, choking on liquids.  Appetite: good  Energy Level: Poor  Post radiation skin Changes: No    Additional comments if applicable:   BP (!) (P) 159/71 (BP Location: Left Arm, Patient Position: Sitting)   Pulse (!) (P) 54   Temp (P) 97.7 F (36.5 C) (Temporal)   Resp (P) 20   Ht (P) 5' 0.35 (1.533 m)   Wt (P) 212 lb 6 oz (96.3 kg)   SpO2 93%   PF (!) 2 L/min   BMI (P) 41.00 kg/m

## 2023-09-05 ENCOUNTER — Other Ambulatory Visit: Payer: Self-pay | Admitting: Radiology

## 2023-09-05 DIAGNOSIS — C3411 Malignant neoplasm of upper lobe, right bronchus or lung: Secondary | ICD-10-CM

## 2023-09-12 ENCOUNTER — Other Ambulatory Visit: Payer: Self-pay | Admitting: Cardiovascular Disease

## 2024-02-15 ENCOUNTER — Telehealth: Payer: Self-pay | Admitting: *Deleted

## 2024-02-15 NOTE — Telephone Encounter (Signed)
 Called patient to inform of Ct for 03-07-24- arrival time- 11 am @ [facility], no restrictions to scan, patient to receive results from Dr. Shannon on 03/11/24 @ 11:30 am, spoke with patient and he is aware of these appts. and the instructions

## 2024-03-07 ENCOUNTER — Ambulatory Visit (HOSPITAL_COMMUNITY)

## 2024-03-11 ENCOUNTER — Ambulatory Visit: Payer: Self-pay | Admitting: Radiation Oncology

## 2024-03-28 ENCOUNTER — Ambulatory Visit (HOSPITAL_COMMUNITY)

## 2024-04-04 ENCOUNTER — Ambulatory Visit: Admitting: Radiation Oncology
